# Patient Record
Sex: Male | Born: 1940
Health system: Southern US, Community
[De-identification: ages and names within clinical notes are randomized; demographics above are authoritative.]

## PROBLEM LIST (undated history)

## (undated) DIAGNOSIS — I2510 Atherosclerotic heart disease of native coronary artery without angina pectoris: Secondary | ICD-10-CM

## (undated) DIAGNOSIS — I471 Supraventricular tachycardia: Secondary | ICD-10-CM

## (undated) DIAGNOSIS — I480 Paroxysmal atrial fibrillation: Secondary | ICD-10-CM

## (undated) DIAGNOSIS — R0989 Other specified symptoms and signs involving the circulatory and respiratory systems: Secondary | ICD-10-CM

## (undated) DIAGNOSIS — Z950 Presence of cardiac pacemaker: Secondary | ICD-10-CM

## (undated) DIAGNOSIS — E785 Hyperlipidemia, unspecified: Secondary | ICD-10-CM

## (undated) DIAGNOSIS — I483 Typical atrial flutter: Secondary | ICD-10-CM

## (undated) DIAGNOSIS — R569 Unspecified convulsions: Secondary | ICD-10-CM

## (undated) DIAGNOSIS — R42 Dizziness and giddiness: Secondary | ICD-10-CM

## (undated) DIAGNOSIS — Z45018 Encounter for adjustment and management of other part of cardiac pacemaker: Secondary | ICD-10-CM

## (undated) DIAGNOSIS — R7303 Prediabetes: Secondary | ICD-10-CM

## (undated) DIAGNOSIS — G8191 Hemiplegia, unspecified affecting right dominant side: Secondary | ICD-10-CM

## (undated) DIAGNOSIS — I119 Hypertensive heart disease without heart failure: Secondary | ICD-10-CM

## (undated) DIAGNOSIS — IMO0002 Reserved for concepts with insufficient information to code with codable children: Secondary | ICD-10-CM

## (undated) DIAGNOSIS — E7801 Familial hypercholesterolemia: Secondary | ICD-10-CM

## (undated) DIAGNOSIS — R159 Full incontinence of feces: Secondary | ICD-10-CM

## (undated) DIAGNOSIS — D62 Acute posthemorrhagic anemia: Secondary | ICD-10-CM

## (undated) DIAGNOSIS — I422 Other hypertrophic cardiomyopathy: Secondary | ICD-10-CM

## (undated) DIAGNOSIS — R0682 Tachypnea, not elsewhere classified: Secondary | ICD-10-CM

## (undated) DIAGNOSIS — D696 Thrombocytopenia, unspecified: Secondary | ICD-10-CM

## (undated) DIAGNOSIS — I6350 Cerebral infarction due to unspecified occlusion or stenosis of unspecified cerebral artery: Secondary | ICD-10-CM

## (undated) DIAGNOSIS — I25119 Atherosclerotic heart disease of native coronary artery with unspecified angina pectoris: Secondary | ICD-10-CM

## (undated) DIAGNOSIS — I63512 Cerebral infarction due to unspecified occlusion or stenosis of left middle cerebral artery: Secondary | ICD-10-CM

## (undated) DIAGNOSIS — Z8679 Personal history of other diseases of the circulatory system: Secondary | ICD-10-CM

## (undated) DIAGNOSIS — G500 Trigeminal neuralgia: Secondary | ICD-10-CM

## (undated) DIAGNOSIS — Z9861 Coronary angioplasty status: Secondary | ICD-10-CM

## (undated) DIAGNOSIS — R0600 Dyspnea, unspecified: Secondary | ICD-10-CM

## (undated) DIAGNOSIS — I10 Essential (primary) hypertension: Secondary | ICD-10-CM

## (undated) DIAGNOSIS — D72829 Elevated white blood cell count, unspecified: Secondary | ICD-10-CM

## (undated) DIAGNOSIS — Z9889 Other specified postprocedural states: Secondary | ICD-10-CM

## (undated) DIAGNOSIS — Z720 Tobacco use: Secondary | ICD-10-CM

## (undated) DIAGNOSIS — I4891 Unspecified atrial fibrillation: Secondary | ICD-10-CM

## (undated) DIAGNOSIS — Z7901 Long term (current) use of anticoagulants: Secondary | ICD-10-CM

## (undated) DIAGNOSIS — F17200 Nicotine dependence, unspecified, uncomplicated: Secondary | ICD-10-CM

## (undated) DIAGNOSIS — I639 Cerebral infarction, unspecified: Secondary | ICD-10-CM

## (undated) DIAGNOSIS — I495 Sick sinus syndrome: Secondary | ICD-10-CM

## (undated) DIAGNOSIS — Z9289 Personal history of other medical treatment: Secondary | ICD-10-CM

## (undated) DIAGNOSIS — Z79899 Other long term (current) drug therapy: Secondary | ICD-10-CM

## (undated) DIAGNOSIS — J9691 Respiratory failure, unspecified with hypoxia: Secondary | ICD-10-CM

## (undated) HISTORY — DX: Thrombocytopenia, unspecified: D69.6

## (undated) HISTORY — DX: Paroxysmal atrial fibrillation: I48.0

## (undated) HISTORY — DX: Respiratory failure, unspecified with hypoxia: J96.91

## (undated) HISTORY — DX: Cerebral infarction, unspecified: I63.9

## (undated) HISTORY — DX: Dizziness and giddiness: R42

## (undated) HISTORY — DX: Tachypnea, not elsewhere classified: R06.82

## (undated) HISTORY — PX: LAMINECTOMY: SHX219

## (undated) HISTORY — DX: Essential (primary) hypertension: I10

## (undated) HISTORY — DX: Other long term (current) drug therapy: Z79.899

## (undated) HISTORY — DX: Typical atrial flutter: I48.3

## (undated) HISTORY — DX: Hemiplegia, unspecified affecting right dominant side: G81.91

## (undated) HISTORY — DX: Supraventricular tachycardia: I47.1

## (undated) HISTORY — DX: Personal history of other diseases of the circulatory system: Z86.79

## (undated) HISTORY — PX: OTHER SURGICAL HISTORY: SHX169

## (undated) HISTORY — DX: Dyspnea, unspecified: R06.00

## (undated) HISTORY — PX: KNEE ARTHROSCOPY: SHX127

## (undated) HISTORY — DX: Familial hypercholesterolemia: E78.01

## (undated) HISTORY — DX: Cerebral infarction due to unspecified occlusion or stenosis of left middle cerebral artery: I63.512

## (undated) HISTORY — DX: Unspecified atrial fibrillation: I48.91

## (undated) HISTORY — DX: Other hypertrophic cardiomyopathy: I42.2

## (undated) HISTORY — DX: Hyperlipidemia, unspecified: E78.5

## (undated) HISTORY — DX: Trigeminal neuralgia: G50.0

## (undated) HISTORY — DX: Atherosclerotic heart disease of native coronary artery with unspecified angina pectoris: I25.119

## (undated) HISTORY — DX: Elevated white blood cell count, unspecified: D72.829

## (undated) HISTORY — DX: Hypertensive heart disease without heart failure: I11.9

## (undated) HISTORY — DX: Presence of cardiac pacemaker: Z95.0

## (undated) HISTORY — DX: Nicotine dependence, unspecified, uncomplicated: F17.200

## (undated) HISTORY — DX: Unspecified convulsions: R56.9

## (undated) HISTORY — DX: Prediabetes: R73.03

## (undated) HISTORY — DX: Personal history of other medical treatment: Z92.89

## (undated) HISTORY — DX: Full incontinence of feces: R15.9

## (undated) HISTORY — DX: Acute posthemorrhagic anemia: D62

## (undated) HISTORY — DX: Reserved for concepts with insufficient information to code with codable children: IMO0002

## (undated) HISTORY — DX: Cerebral infarction due to unspecified occlusion or stenosis of unspecified cerebral artery: I63.50

## (undated) HISTORY — DX: Other specified postprocedural states: Z98.890

## (undated) HISTORY — DX: Tobacco use: Z72.0

## (undated) HISTORY — DX: Encounter for adjustment and management of other part of cardiac pacemaker: Z45.018

## (undated) HISTORY — DX: Other specified symptoms and signs involving the circulatory and respiratory systems: R09.89

---

## 2006-02-23 HISTORY — PX: INSERT / REPLACE / REMOVE PACEMAKER: SUR710

## 2010-03-25 ENCOUNTER — Ambulatory Visit
Admission: RE | Admit: 2010-03-25 | Discharge: 2010-03-25 | Payer: Self-pay | Source: Home / Self Care | Attending: Cardiovascular Disease | Admitting: Cardiovascular Disease

## 2010-03-25 NOTE — Letter (Signed)
March 25, 2010   Luis Robinson, M.D. [facility] [address] [place], [place] [address]  RE:  Luis Robinson MRN:  [id number]  /  DOB:  [birth year]  Dear Dr. Belva Crome:  Thank you for referring Luis Robinson for further cardiac evaluation.  As you are aware, this is a 70 year old gentleman with the following problem list: 1. History of hypertrophic cardiomyopathy which was diagnosed in 1999.     He is currently being treated medically. 2. Sick sinus syndrome, status post permanent pacemaker placement in     2008 at [facility]. 3. Hyperlipidemia. 4. Hypertension. 5. Tobacco use.  HISTORY OF PRESENT ILLNESS:  Luis Robinson is here today for a followup visit regarding his cardiac condition.  He is being seen here for the first time.  He carries a diagnosis of hypertrophic cardiomyopathy which was diagnosed in the early 23s.  Most of his symptoms at that time were dyspnea and dizziness.  At some point in 2008, they talked with him about the possibility of performing alcohol septal ablation.  It seems that his symptoms were mild and thus he was treated medically.  His symptoms of dizziness worsened in 2008.  His evaluation revealed sinus pauses according to him which required ultimately a permanent pacemaker placement which was done at [facility].  Before that, the patient was living in the [facility].  He did follow up with Dr. Dulce Sellar here but has not seen him in more than a year due to insurance reasons. Patient overall has been doing reasonably well.  He has not had any episodes of chest pain or dyspnea.  He does complain of dizziness, especially when he stands up and with certain positions.  There has been no syncope.  There is no orthopnea, PND, or lower extremity edema. Overall, he continues to be active and does not seem to have significant physical limitations.  He gets his pacemaker checked over the phone.  MEDICATIONS: 1. Metoprolol extended  release 50 mg once daily. 2. Crestor 20 mg once daily. 3. Celebrex 200 mg once daily. 4. Aspirin 81 mg once daily. 5. Multivitamin once daily. 6. Fish oil 1000 mg twice daily.  ALLERGIES:  NO KNOWN DRUG ALLERGIES.  SOCIAL HISTORY:  He smokes 5 to 6 cigarettes a day and has been doing that for 45 years.  He tried to quit multiple times with multiple interventions used but all have failed.  He denies any alcohol or recreational drug use.  He does exercise at the [facility] on a regular basis. He is widowed and retired.  He used to work in home improvement.  PAST SURGICAL HISTORY:  Includes: 1. Back surgery twice. 2. Rotator cuff surgery. 3. Permanent pacemaker placement.  FAMILY HISTORY:  His father had a heart murmur but does not know much more details.  There is no family history of premature coronary artery disease.  REVIEW OF SYSTEMS:  Remarkable for dizziness as described above.  There has been no chest pain or dyspnea.  A full review of system was performed and is otherwise negative.  PHYSICAL EXAMINATION:  GENERAL:  The patient appears to be at his stated age and in no acute distress. VITAL SIGNS:  Weight is 207.6 pounds, blood pressure is 113/64, pulse is 76, oxygen saturation is 96% on room air. HEENT:  Normocephalic, atraumatic. NECK:  No JVD or carotid bruits. RESPIRATORY:  Normal respiratory effort with no use of accessory muscles.  Auscultation reveals normal breath sounds. CARDIOVASCULAR:  Normal PMI.  Normal S1 and S2 with no gallops.  There is a 3/6 systolic ejection murmur at the aortic area but it is loudest at the left sternal border.  The murmur is slightly louder with Valsalva maneuver.  He also has a murmur at the apex suspicious for mitral regurgitation. ABDOMEN:  Benign, nontender, nondistended. EXTREMITIES:  With no clubbing, cyanosis, or edema. SKIN:  Warm and dry with no rash. PSYCHIATRIC:  He is alert, oriented x3, with normal mood and  affect. MUSCULOSKELETAL:  There is normal muscle strength in the upper and lower extremities.  An electrocardiogram was performed which showed AV sequential pacemaker which seems to be sensing and capturing normally.  IMPRESSION: 1. Hypertrophic cardiomyopathy:  Overall, his symptoms of obstruction     seem to be very mild and controlled with current treatment with     metoprolol.  His most recent cardiac evaluation included a stress     test which was done in September of 2010 with [facility].     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  His current symptoms     mainly include mild dizziness.  He does have a loud murmur by     physical exam suggestive of hypertrophic cardiomyopathy but there     is also another murmur that might indicate mitral regurgitation     which is usually associated with this condition.  Thus, I will     request a transthoracic echocardiogram for further evaluation given     that he has not had once in awhile.  Most likely we will continue     medical therapy given that his symptoms are controlled. 2. Status post permanent pacemaker placement for sick sinus syndrome:     His pacemaker seems to be functioning well. 3. Tobacco use:  The patient was counseled about the importance of     smoking cessation. 4. Hyperlipidemia, being treated with Crestor.  The patient will follow up in 6 months from now or earlier if needed.   Sincerely,     Luis Robinson, Luis Robinson Electronically Signed   MA/MedQ  DD: 03/25/2010  DT: 03/25/2010  Job #: [id number]

## 2010-03-26 ENCOUNTER — Telehealth (INDEPENDENT_AMBULATORY_CARE_PROVIDER_SITE_OTHER): Payer: Self-pay | Admitting: *Deleted

## 2010-04-02 NOTE — Progress Notes (Signed)
----   Converted from flag ---- ---- 03/25/2010 4:15 PM, Marilynne Halsted, CMA, AAMA wrote: Pt has [employer] only.  no precert required.  ---- 03/25/2010 2:30 PM, Dessie Coma  LPN wrote: ECHO SCHEDULED AT [facility] FOR 03/27/10 FOR 425.11.  THANKS,Mayo Owczarzak ------------------------------

## 2010-04-07 ENCOUNTER — Telehealth: Payer: Self-pay | Admitting: Cardiovascular Disease

## 2010-04-16 NOTE — Progress Notes (Signed)
Summary: Echo results  Phone Note Outgoing Call   Call placed by: Dessie Coma  LPN,  April 07, 2010 3:57 PM Call placed to: Patient Summary of Call: Patient notified per Dr. Kirke Corin, Echo was OK.  There is moderate leaking in one of the valves.  Will monitor this.

## 2010-04-21 ENCOUNTER — Encounter: Payer: Self-pay | Admitting: Cardiovascular Disease

## 2010-08-06 ENCOUNTER — Encounter: Payer: Self-pay | Admitting: Cardiovascular Disease

## 2012-10-13 DIAGNOSIS — G500 Trigeminal neuralgia: Secondary | ICD-10-CM

## 2012-10-13 HISTORY — DX: Trigeminal neuralgia: G50.0

## 2014-09-24 DIAGNOSIS — Z9861 Coronary angioplasty status: Secondary | ICD-10-CM

## 2014-09-24 DIAGNOSIS — I2510 Atherosclerotic heart disease of native coronary artery without angina pectoris: Secondary | ICD-10-CM

## 2014-09-24 HISTORY — DX: Atherosclerotic heart disease of native coronary artery without angina pectoris: I25.10

## 2014-09-24 HISTORY — DX: Coronary angioplasty status: Z98.61

## 2014-10-09 DIAGNOSIS — E785 Hyperlipidemia, unspecified: Secondary | ICD-10-CM

## 2014-10-09 DIAGNOSIS — Z950 Presence of cardiac pacemaker: Secondary | ICD-10-CM | POA: Insufficient documentation

## 2014-10-09 DIAGNOSIS — Z45018 Encounter for adjustment and management of other part of cardiac pacemaker: Secondary | ICD-10-CM

## 2014-10-09 DIAGNOSIS — I422 Other hypertrophic cardiomyopathy: Secondary | ICD-10-CM

## 2014-10-09 HISTORY — DX: Hyperlipidemia, unspecified: E78.5

## 2014-10-09 HISTORY — DX: Other hypertrophic cardiomyopathy: I42.2

## 2014-10-09 HISTORY — DX: Encounter for adjustment and management of other part of cardiac pacemaker: Z45.018

## 2014-10-30 DIAGNOSIS — I25119 Atherosclerotic heart disease of native coronary artery with unspecified angina pectoris: Secondary | ICD-10-CM

## 2014-10-30 HISTORY — DX: Atherosclerotic heart disease of native coronary artery with unspecified angina pectoris: I25.119

## 2014-11-29 DIAGNOSIS — Z23 Encounter for immunization: Secondary | ICD-10-CM | POA: Diagnosis not present

## 2014-12-06 DIAGNOSIS — Z0289 Encounter for other administrative examinations: Secondary | ICD-10-CM | POA: Diagnosis not present

## 2014-12-12 DIAGNOSIS — Z0100 Encounter for examination of eyes and vision without abnormal findings: Secondary | ICD-10-CM | POA: Diagnosis not present

## 2014-12-12 DIAGNOSIS — H524 Presbyopia: Secondary | ICD-10-CM | POA: Diagnosis not present

## 2014-12-25 HISTORY — PX: ABLATION: SHX5711

## 2015-01-07 DIAGNOSIS — Z45018 Encounter for adjustment and management of other part of cardiac pacemaker: Secondary | ICD-10-CM | POA: Diagnosis not present

## 2015-01-07 DIAGNOSIS — I498 Other specified cardiac arrhythmias: Secondary | ICD-10-CM | POA: Diagnosis not present

## 2015-01-15 DIAGNOSIS — I471 Supraventricular tachycardia, unspecified: Secondary | ICD-10-CM

## 2015-01-15 DIAGNOSIS — E782 Mixed hyperlipidemia: Secondary | ICD-10-CM | POA: Diagnosis not present

## 2015-01-15 DIAGNOSIS — T829XXA Unspecified complication of cardiac and vascular prosthetic device, implant and graft, initial encounter: Secondary | ICD-10-CM | POA: Diagnosis not present

## 2015-01-15 DIAGNOSIS — I517 Cardiomegaly: Secondary | ICD-10-CM | POA: Diagnosis not present

## 2015-01-15 DIAGNOSIS — E7800 Pure hypercholesterolemia, unspecified: Secondary | ICD-10-CM | POA: Diagnosis not present

## 2015-01-15 DIAGNOSIS — I5030 Unspecified diastolic (congestive) heart failure: Secondary | ICD-10-CM | POA: Diagnosis not present

## 2015-01-15 DIAGNOSIS — Z950 Presence of cardiac pacemaker: Secondary | ICD-10-CM | POA: Diagnosis not present

## 2015-01-15 DIAGNOSIS — R000 Tachycardia, unspecified: Secondary | ICD-10-CM | POA: Diagnosis not present

## 2015-01-15 DIAGNOSIS — I081 Rheumatic disorders of both mitral and tricuspid valves: Secondary | ICD-10-CM | POA: Diagnosis not present

## 2015-01-15 DIAGNOSIS — X58XXXA Exposure to other specified factors, initial encounter: Secondary | ICD-10-CM | POA: Diagnosis not present

## 2015-01-15 DIAGNOSIS — R002 Palpitations: Secondary | ICD-10-CM | POA: Diagnosis not present

## 2015-01-15 DIAGNOSIS — R0602 Shortness of breath: Secondary | ICD-10-CM | POA: Diagnosis not present

## 2015-01-15 DIAGNOSIS — I480 Paroxysmal atrial fibrillation: Secondary | ICD-10-CM | POA: Diagnosis not present

## 2015-01-15 DIAGNOSIS — I10 Essential (primary) hypertension: Secondary | ICD-10-CM | POA: Diagnosis not present

## 2015-01-15 DIAGNOSIS — I5020 Unspecified systolic (congestive) heart failure: Secondary | ICD-10-CM | POA: Diagnosis not present

## 2015-01-15 DIAGNOSIS — I252 Old myocardial infarction: Secondary | ICD-10-CM | POA: Diagnosis not present

## 2015-01-15 DIAGNOSIS — Z955 Presence of coronary angioplasty implant and graft: Secondary | ICD-10-CM | POA: Diagnosis not present

## 2015-01-15 DIAGNOSIS — I495 Sick sinus syndrome: Secondary | ICD-10-CM | POA: Diagnosis not present

## 2015-01-15 DIAGNOSIS — Z45018 Encounter for adjustment and management of other part of cardiac pacemaker: Secondary | ICD-10-CM | POA: Diagnosis not present

## 2015-01-15 DIAGNOSIS — I4892 Unspecified atrial flutter: Secondary | ICD-10-CM | POA: Diagnosis not present

## 2015-01-15 DIAGNOSIS — J811 Chronic pulmonary edema: Secondary | ICD-10-CM | POA: Diagnosis not present

## 2015-01-15 DIAGNOSIS — I421 Obstructive hypertrophic cardiomyopathy: Secondary | ICD-10-CM | POA: Diagnosis not present

## 2015-01-15 DIAGNOSIS — I4891 Unspecified atrial fibrillation: Secondary | ICD-10-CM | POA: Diagnosis not present

## 2015-01-15 DIAGNOSIS — M7981 Nontraumatic hematoma of soft tissue: Secondary | ICD-10-CM | POA: Diagnosis not present

## 2015-01-15 DIAGNOSIS — Z952 Presence of prosthetic heart valve: Secondary | ICD-10-CM | POA: Diagnosis not present

## 2015-01-15 DIAGNOSIS — I2510 Atherosclerotic heart disease of native coronary artery without angina pectoris: Secondary | ICD-10-CM | POA: Diagnosis not present

## 2015-01-15 DIAGNOSIS — I422 Other hypertrophic cardiomyopathy: Secondary | ICD-10-CM | POA: Diagnosis not present

## 2015-01-15 DIAGNOSIS — I484 Atypical atrial flutter: Secondary | ICD-10-CM | POA: Diagnosis not present

## 2015-01-15 DIAGNOSIS — I483 Typical atrial flutter: Secondary | ICD-10-CM

## 2015-01-15 DIAGNOSIS — I340 Nonrheumatic mitral (valve) insufficiency: Secondary | ICD-10-CM | POA: Diagnosis not present

## 2015-01-15 DIAGNOSIS — Z9861 Coronary angioplasty status: Secondary | ICD-10-CM | POA: Diagnosis not present

## 2015-01-15 DIAGNOSIS — R079 Chest pain, unspecified: Secondary | ICD-10-CM | POA: Diagnosis not present

## 2015-01-15 DIAGNOSIS — I25119 Atherosclerotic heart disease of native coronary artery with unspecified angina pectoris: Secondary | ICD-10-CM | POA: Diagnosis not present

## 2015-01-15 HISTORY — DX: Typical atrial flutter: I48.3

## 2015-01-15 HISTORY — DX: Supraventricular tachycardia: I47.1

## 2015-01-15 HISTORY — DX: Supraventricular tachycardia, unspecified: I47.10

## 2015-01-18 DIAGNOSIS — I517 Cardiomegaly: Secondary | ICD-10-CM | POA: Diagnosis not present

## 2015-01-18 DIAGNOSIS — I081 Rheumatic disorders of both mitral and tricuspid valves: Secondary | ICD-10-CM | POA: Diagnosis not present

## 2015-01-18 DIAGNOSIS — T829XXA Unspecified complication of cardiac and vascular prosthetic device, implant and graft, initial encounter: Secondary | ICD-10-CM | POA: Diagnosis not present

## 2015-01-18 DIAGNOSIS — I495 Sick sinus syndrome: Secondary | ICD-10-CM | POA: Diagnosis not present

## 2015-01-18 DIAGNOSIS — I4891 Unspecified atrial fibrillation: Secondary | ICD-10-CM | POA: Diagnosis not present

## 2015-01-18 DIAGNOSIS — Z45018 Encounter for adjustment and management of other part of cardiac pacemaker: Secondary | ICD-10-CM | POA: Diagnosis not present

## 2015-01-18 DIAGNOSIS — X58XXXA Exposure to other specified factors, initial encounter: Secondary | ICD-10-CM | POA: Diagnosis not present

## 2015-01-19 DIAGNOSIS — Z950 Presence of cardiac pacemaker: Secondary | ICD-10-CM | POA: Diagnosis not present

## 2015-01-19 DIAGNOSIS — Z955 Presence of coronary angioplasty implant and graft: Secondary | ICD-10-CM | POA: Diagnosis not present

## 2015-01-19 DIAGNOSIS — I421 Obstructive hypertrophic cardiomyopathy: Secondary | ICD-10-CM | POA: Diagnosis not present

## 2015-01-19 DIAGNOSIS — I2510 Atherosclerotic heart disease of native coronary artery without angina pectoris: Secondary | ICD-10-CM | POA: Diagnosis not present

## 2015-01-19 DIAGNOSIS — R002 Palpitations: Secondary | ICD-10-CM | POA: Diagnosis not present

## 2015-01-20 DIAGNOSIS — I4892 Unspecified atrial flutter: Secondary | ICD-10-CM | POA: Diagnosis not present

## 2015-01-20 DIAGNOSIS — Z955 Presence of coronary angioplasty implant and graft: Secondary | ICD-10-CM | POA: Diagnosis not present

## 2015-01-20 DIAGNOSIS — Z950 Presence of cardiac pacemaker: Secondary | ICD-10-CM | POA: Diagnosis not present

## 2015-01-21 DIAGNOSIS — I4892 Unspecified atrial flutter: Secondary | ICD-10-CM | POA: Diagnosis not present

## 2015-01-21 DIAGNOSIS — Z87891 Personal history of nicotine dependence: Secondary | ICD-10-CM | POA: Diagnosis not present

## 2015-01-21 DIAGNOSIS — I4891 Unspecified atrial fibrillation: Secondary | ICD-10-CM | POA: Diagnosis not present

## 2015-01-21 DIAGNOSIS — I484 Atypical atrial flutter: Secondary | ICD-10-CM | POA: Diagnosis not present

## 2015-01-21 DIAGNOSIS — E782 Mixed hyperlipidemia: Secondary | ICD-10-CM | POA: Diagnosis not present

## 2015-01-21 DIAGNOSIS — J449 Chronic obstructive pulmonary disease, unspecified: Secondary | ICD-10-CM | POA: Diagnosis not present

## 2015-01-21 DIAGNOSIS — I483 Typical atrial flutter: Secondary | ICD-10-CM | POA: Diagnosis not present

## 2015-01-21 DIAGNOSIS — I10 Essential (primary) hypertension: Secondary | ICD-10-CM | POA: Diagnosis not present

## 2015-01-21 DIAGNOSIS — I25119 Atherosclerotic heart disease of native coronary artery with unspecified angina pectoris: Secondary | ICD-10-CM | POA: Diagnosis not present

## 2015-01-21 DIAGNOSIS — E785 Hyperlipidemia, unspecified: Secondary | ICD-10-CM | POA: Diagnosis not present

## 2015-01-21 DIAGNOSIS — I2510 Atherosclerotic heart disease of native coronary artery without angina pectoris: Secondary | ICD-10-CM | POA: Diagnosis not present

## 2015-01-21 DIAGNOSIS — Z950 Presence of cardiac pacemaker: Secondary | ICD-10-CM | POA: Diagnosis not present

## 2015-01-21 DIAGNOSIS — I340 Nonrheumatic mitral (valve) insufficiency: Secondary | ICD-10-CM | POA: Diagnosis not present

## 2015-01-21 DIAGNOSIS — I471 Supraventricular tachycardia: Secondary | ICD-10-CM | POA: Diagnosis not present

## 2015-01-21 DIAGNOSIS — Z955 Presence of coronary angioplasty implant and graft: Secondary | ICD-10-CM | POA: Diagnosis not present

## 2015-01-21 DIAGNOSIS — M1990 Unspecified osteoarthritis, unspecified site: Secondary | ICD-10-CM | POA: Diagnosis not present

## 2015-01-21 DIAGNOSIS — M7981 Nontraumatic hematoma of soft tissue: Secondary | ICD-10-CM | POA: Diagnosis not present

## 2015-01-21 DIAGNOSIS — I081 Rheumatic disorders of both mitral and tricuspid valves: Secondary | ICD-10-CM | POA: Diagnosis not present

## 2015-01-21 DIAGNOSIS — I422 Other hypertrophic cardiomyopathy: Secondary | ICD-10-CM | POA: Diagnosis not present

## 2015-01-23 DIAGNOSIS — Z9889 Other specified postprocedural states: Secondary | ICD-10-CM

## 2015-01-23 DIAGNOSIS — Z8679 Personal history of other diseases of the circulatory system: Secondary | ICD-10-CM | POA: Insufficient documentation

## 2015-01-23 DIAGNOSIS — I4892 Unspecified atrial flutter: Secondary | ICD-10-CM | POA: Diagnosis not present

## 2015-01-23 HISTORY — DX: Other specified postprocedural states: Z86.79

## 2015-01-23 HISTORY — DX: Personal history of other diseases of the circulatory system: Z98.890

## 2015-01-24 DIAGNOSIS — I422 Other hypertrophic cardiomyopathy: Secondary | ICD-10-CM | POA: Diagnosis not present

## 2015-01-24 DIAGNOSIS — I483 Typical atrial flutter: Secondary | ICD-10-CM | POA: Diagnosis not present

## 2015-01-24 DIAGNOSIS — Z950 Presence of cardiac pacemaker: Secondary | ICD-10-CM | POA: Diagnosis not present

## 2015-01-24 DIAGNOSIS — I471 Supraventricular tachycardia: Secondary | ICD-10-CM | POA: Diagnosis not present

## 2015-01-24 DIAGNOSIS — E782 Mixed hyperlipidemia: Secondary | ICD-10-CM | POA: Diagnosis not present

## 2015-01-24 DIAGNOSIS — I25119 Atherosclerotic heart disease of native coronary artery with unspecified angina pectoris: Secondary | ICD-10-CM | POA: Diagnosis not present

## 2015-02-01 DIAGNOSIS — I422 Other hypertrophic cardiomyopathy: Secondary | ICD-10-CM | POA: Diagnosis not present

## 2015-02-20 DIAGNOSIS — I4892 Unspecified atrial flutter: Secondary | ICD-10-CM | POA: Diagnosis not present

## 2015-03-07 DIAGNOSIS — M25469 Effusion, unspecified knee: Secondary | ICD-10-CM | POA: Diagnosis not present

## 2015-03-07 DIAGNOSIS — M25462 Effusion, left knee: Secondary | ICD-10-CM | POA: Diagnosis not present

## 2015-03-13 DIAGNOSIS — M1712 Unilateral primary osteoarthritis, left knee: Secondary | ICD-10-CM | POA: Diagnosis not present

## 2015-03-13 DIAGNOSIS — M179 Osteoarthritis of knee, unspecified: Secondary | ICD-10-CM | POA: Diagnosis not present

## 2015-03-13 DIAGNOSIS — M25462 Effusion, left knee: Secondary | ICD-10-CM | POA: Diagnosis not present

## 2015-03-25 DIAGNOSIS — M25462 Effusion, left knee: Secondary | ICD-10-CM | POA: Diagnosis not present

## 2015-04-09 DIAGNOSIS — Z45010 Encounter for checking and testing of cardiac pacemaker pulse generator [battery]: Secondary | ICD-10-CM | POA: Diagnosis not present

## 2015-04-09 DIAGNOSIS — I498 Other specified cardiac arrhythmias: Secondary | ICD-10-CM | POA: Diagnosis not present

## 2015-04-25 DIAGNOSIS — I10 Essential (primary) hypertension: Secondary | ICD-10-CM | POA: Diagnosis not present

## 2015-04-25 DIAGNOSIS — Z79899 Other long term (current) drug therapy: Secondary | ICD-10-CM | POA: Diagnosis not present

## 2015-04-25 DIAGNOSIS — D696 Thrombocytopenia, unspecified: Secondary | ICD-10-CM | POA: Diagnosis not present

## 2015-04-25 DIAGNOSIS — E7800 Pure hypercholesterolemia, unspecified: Secondary | ICD-10-CM | POA: Diagnosis not present

## 2015-04-25 DIAGNOSIS — I2510 Atherosclerotic heart disease of native coronary artery without angina pectoris: Secondary | ICD-10-CM | POA: Diagnosis not present

## 2015-04-25 DIAGNOSIS — G500 Trigeminal neuralgia: Secondary | ICD-10-CM | POA: Diagnosis not present

## 2015-04-25 DIAGNOSIS — M1712 Unilateral primary osteoarthritis, left knee: Secondary | ICD-10-CM | POA: Diagnosis not present

## 2015-05-07 DIAGNOSIS — I10 Essential (primary) hypertension: Secondary | ICD-10-CM | POA: Diagnosis not present

## 2015-05-07 DIAGNOSIS — Z79899 Other long term (current) drug therapy: Secondary | ICD-10-CM | POA: Diagnosis not present

## 2015-05-07 DIAGNOSIS — E7800 Pure hypercholesterolemia, unspecified: Secondary | ICD-10-CM | POA: Diagnosis not present

## 2015-05-07 DIAGNOSIS — M25462 Effusion, left knee: Secondary | ICD-10-CM | POA: Diagnosis not present

## 2015-05-07 DIAGNOSIS — I2510 Atherosclerotic heart disease of native coronary artery without angina pectoris: Secondary | ICD-10-CM | POA: Diagnosis not present

## 2015-05-07 DIAGNOSIS — D696 Thrombocytopenia, unspecified: Secondary | ICD-10-CM | POA: Diagnosis not present

## 2015-05-20 ENCOUNTER — Emergency Department (HOSPITAL_COMMUNITY): Payer: Medicare HMO

## 2015-05-20 ENCOUNTER — Emergency Department (HOSPITAL_COMMUNITY): Payer: Medicare HMO | Admitting: Certified Registered"

## 2015-05-20 ENCOUNTER — Encounter (HOSPITAL_COMMUNITY)
Admission: EM | Disposition: A | Discharge status: Rehabilitation Facility | Payer: Self-pay | Source: Home / Self Care | Attending: Neurology

## 2015-05-20 ENCOUNTER — Inpatient Hospital Stay (HOSPITAL_COMMUNITY)
Admission: EM | Admit: 2015-05-20 | Discharge: 2015-05-24 | DRG: 023 | Disposition: A | Discharge status: Rehabilitation Facility | Payer: Medicare HMO | Attending: Neurology | Admitting: Neurology

## 2015-05-20 ENCOUNTER — Encounter (HOSPITAL_COMMUNITY): Payer: Self-pay | Admitting: Radiology

## 2015-05-20 DIAGNOSIS — Z7982 Long term (current) use of aspirin: Secondary | ICD-10-CM

## 2015-05-20 DIAGNOSIS — I63512 Cerebral infarction due to unspecified occlusion or stenosis of left middle cerebral artery: Secondary | ICD-10-CM | POA: Diagnosis not present

## 2015-05-20 DIAGNOSIS — I480 Paroxysmal atrial fibrillation: Secondary | ICD-10-CM | POA: Diagnosis present

## 2015-05-20 DIAGNOSIS — I6522 Occlusion and stenosis of left carotid artery: Secondary | ICD-10-CM | POA: Diagnosis present

## 2015-05-20 DIAGNOSIS — R531 Weakness: Secondary | ICD-10-CM | POA: Diagnosis not present

## 2015-05-20 DIAGNOSIS — J95821 Acute postprocedural respiratory failure: Secondary | ICD-10-CM | POA: Diagnosis not present

## 2015-05-20 DIAGNOSIS — I2510 Atherosclerotic heart disease of native coronary artery without angina pectoris: Secondary | ICD-10-CM

## 2015-05-20 DIAGNOSIS — Z9861 Coronary angioplasty status: Secondary | ICD-10-CM

## 2015-05-20 DIAGNOSIS — I421 Obstructive hypertrophic cardiomyopathy: Secondary | ICD-10-CM | POA: Diagnosis not present

## 2015-05-20 DIAGNOSIS — I509 Heart failure, unspecified: Secondary | ICD-10-CM | POA: Diagnosis present

## 2015-05-20 DIAGNOSIS — F17210 Nicotine dependence, cigarettes, uncomplicated: Secondary | ICD-10-CM | POA: Diagnosis present

## 2015-05-20 DIAGNOSIS — R4701 Aphasia: Secondary | ICD-10-CM | POA: Diagnosis not present

## 2015-05-20 DIAGNOSIS — I6350 Cerebral infarction due to unspecified occlusion or stenosis of unspecified cerebral artery: Secondary | ICD-10-CM | POA: Diagnosis present

## 2015-05-20 DIAGNOSIS — D696 Thrombocytopenia, unspecified: Secondary | ICD-10-CM | POA: Diagnosis not present

## 2015-05-20 DIAGNOSIS — N3000 Acute cystitis without hematuria: Secondary | ICD-10-CM | POA: Diagnosis not present

## 2015-05-20 DIAGNOSIS — J9691 Respiratory failure, unspecified with hypoxia: Secondary | ICD-10-CM | POA: Diagnosis present

## 2015-05-20 DIAGNOSIS — Y838 Other surgical procedures as the cause of abnormal reaction of the patient, or of later complication, without mention of misadventure at the time of the procedure: Secondary | ICD-10-CM | POA: Diagnosis not present

## 2015-05-20 DIAGNOSIS — I482 Chronic atrial fibrillation, unspecified: Secondary | ICD-10-CM | POA: Diagnosis present

## 2015-05-20 DIAGNOSIS — I6789 Other cerebrovascular disease: Secondary | ICD-10-CM | POA: Diagnosis not present

## 2015-05-20 DIAGNOSIS — I10 Essential (primary) hypertension: Secondary | ICD-10-CM | POA: Diagnosis present

## 2015-05-20 DIAGNOSIS — Z950 Presence of cardiac pacemaker: Secondary | ICD-10-CM | POA: Diagnosis present

## 2015-05-20 DIAGNOSIS — I110 Hypertensive heart disease with heart failure: Secondary | ICD-10-CM | POA: Diagnosis present

## 2015-05-20 DIAGNOSIS — R0602 Shortness of breath: Secondary | ICD-10-CM | POA: Diagnosis not present

## 2015-05-20 DIAGNOSIS — R030 Elevated blood-pressure reading, without diagnosis of hypertension: Secondary | ICD-10-CM | POA: Diagnosis not present

## 2015-05-20 DIAGNOSIS — G4733 Obstructive sleep apnea (adult) (pediatric): Secondary | ICD-10-CM | POA: Diagnosis present

## 2015-05-20 DIAGNOSIS — I422 Other hypertrophic cardiomyopathy: Secondary | ICD-10-CM | POA: Diagnosis present

## 2015-05-20 DIAGNOSIS — Z720 Tobacco use: Secondary | ICD-10-CM | POA: Diagnosis present

## 2015-05-20 DIAGNOSIS — R1310 Dysphagia, unspecified: Secondary | ICD-10-CM | POA: Diagnosis not present

## 2015-05-20 DIAGNOSIS — J811 Chronic pulmonary edema: Secondary | ICD-10-CM | POA: Diagnosis not present

## 2015-05-20 DIAGNOSIS — J9690 Respiratory failure, unspecified, unspecified whether with hypoxia or hypercapnia: Secondary | ICD-10-CM | POA: Diagnosis not present

## 2015-05-20 DIAGNOSIS — Z9289 Personal history of other medical treatment: Secondary | ICD-10-CM

## 2015-05-20 DIAGNOSIS — I63412 Cerebral infarction due to embolism of left middle cerebral artery: Secondary | ICD-10-CM | POA: Diagnosis not present

## 2015-05-20 DIAGNOSIS — R29818 Other symptoms and signs involving the nervous system: Secondary | ICD-10-CM | POA: Diagnosis not present

## 2015-05-20 DIAGNOSIS — G8101 Flaccid hemiplegia affecting right dominant side: Secondary | ICD-10-CM | POA: Diagnosis not present

## 2015-05-20 DIAGNOSIS — I69320 Aphasia following cerebral infarction: Secondary | ICD-10-CM | POA: Diagnosis not present

## 2015-05-20 DIAGNOSIS — R7303 Prediabetes: Secondary | ICD-10-CM | POA: Diagnosis not present

## 2015-05-20 DIAGNOSIS — M21371 Foot drop, right foot: Secondary | ICD-10-CM | POA: Diagnosis not present

## 2015-05-20 DIAGNOSIS — I63312 Cerebral infarction due to thrombosis of left middle cerebral artery: Secondary | ICD-10-CM | POA: Diagnosis not present

## 2015-05-20 DIAGNOSIS — J189 Pneumonia, unspecified organism: Secondary | ICD-10-CM | POA: Diagnosis not present

## 2015-05-20 DIAGNOSIS — Z0189 Encounter for other specified special examinations: Secondary | ICD-10-CM

## 2015-05-20 DIAGNOSIS — I639 Cerebral infarction, unspecified: Secondary | ICD-10-CM | POA: Diagnosis not present

## 2015-05-20 DIAGNOSIS — I69391 Dysphagia following cerebral infarction: Secondary | ICD-10-CM | POA: Diagnosis not present

## 2015-05-20 DIAGNOSIS — I495 Sick sinus syndrome: Secondary | ICD-10-CM | POA: Diagnosis present

## 2015-05-20 DIAGNOSIS — J440 Chronic obstructive pulmonary disease with acute lower respiratory infection: Secondary | ICD-10-CM | POA: Diagnosis not present

## 2015-05-20 DIAGNOSIS — D72829 Elevated white blood cell count, unspecified: Secondary | ICD-10-CM

## 2015-05-20 DIAGNOSIS — Z4682 Encounter for fitting and adjustment of non-vascular catheter: Secondary | ICD-10-CM | POA: Diagnosis not present

## 2015-05-20 DIAGNOSIS — Z79899 Other long term (current) drug therapy: Secondary | ICD-10-CM

## 2015-05-20 DIAGNOSIS — Z7901 Long term (current) use of anticoagulants: Secondary | ICD-10-CM | POA: Diagnosis not present

## 2015-05-20 DIAGNOSIS — E785 Hyperlipidemia, unspecified: Secondary | ICD-10-CM | POA: Diagnosis present

## 2015-05-20 DIAGNOSIS — G8191 Hemiplegia, unspecified affecting right dominant side: Secondary | ICD-10-CM | POA: Diagnosis not present

## 2015-05-20 DIAGNOSIS — R05 Cough: Secondary | ICD-10-CM | POA: Diagnosis not present

## 2015-05-20 DIAGNOSIS — I4891 Unspecified atrial fibrillation: Secondary | ICD-10-CM | POA: Diagnosis not present

## 2015-05-20 DIAGNOSIS — I69398 Other sequelae of cerebral infarction: Secondary | ICD-10-CM | POA: Diagnosis not present

## 2015-05-20 DIAGNOSIS — N390 Urinary tract infection, site not specified: Secondary | ICD-10-CM | POA: Diagnosis present

## 2015-05-20 DIAGNOSIS — R29810 Facial weakness: Secondary | ICD-10-CM | POA: Diagnosis present

## 2015-05-20 DIAGNOSIS — D62 Acute posthemorrhagic anemia: Secondary | ICD-10-CM | POA: Diagnosis not present

## 2015-05-20 DIAGNOSIS — J9601 Acute respiratory failure with hypoxia: Secondary | ICD-10-CM | POA: Diagnosis not present

## 2015-05-20 DIAGNOSIS — I6602 Occlusion and stenosis of left middle cerebral artery: Secondary | ICD-10-CM | POA: Diagnosis not present

## 2015-05-20 DIAGNOSIS — I63132 Cerebral infarction due to embolism of left carotid artery: Secondary | ICD-10-CM | POA: Diagnosis not present

## 2015-05-20 DIAGNOSIS — I6502 Occlusion and stenosis of left vertebral artery: Secondary | ICD-10-CM | POA: Diagnosis not present

## 2015-05-20 DIAGNOSIS — G939 Disorder of brain, unspecified: Secondary | ICD-10-CM | POA: Diagnosis not present

## 2015-05-20 DIAGNOSIS — I959 Hypotension, unspecified: Secondary | ICD-10-CM | POA: Diagnosis not present

## 2015-05-20 DIAGNOSIS — Q251 Coarctation of aorta: Secondary | ICD-10-CM

## 2015-05-20 DIAGNOSIS — I69351 Hemiplegia and hemiparesis following cerebral infarction affecting right dominant side: Secondary | ICD-10-CM | POA: Diagnosis not present

## 2015-05-20 HISTORY — DX: Atherosclerotic heart disease of native coronary artery without angina pectoris: I25.10

## 2015-05-20 HISTORY — DX: Long term (current) use of anticoagulants: Z79.01

## 2015-05-20 HISTORY — DX: Paroxysmal atrial fibrillation: I48.0

## 2015-05-20 HISTORY — PX: RADIOLOGY WITH ANESTHESIA: SHX6223

## 2015-05-20 HISTORY — DX: Cerebral infarction, unspecified: I63.9

## 2015-05-20 HISTORY — DX: Sick sinus syndrome: I49.5

## 2015-05-20 HISTORY — DX: Coronary angioplasty status: Z98.61

## 2015-05-20 LAB — DIFFERENTIAL
BASOS ABS: 0 10*3/uL (ref 0.0–0.1)
BASOS PCT: 0 %
EOS ABS: 0.1 10*3/uL (ref 0.0–0.7)
Eosinophils Relative: 1 %
Lymphocytes Relative: 12 %
Lymphs Abs: 1.5 10*3/uL (ref 0.7–4.0)
MONOS PCT: 6 %
Monocytes Absolute: 0.7 10*3/uL (ref 0.1–1.0)
NEUTROS ABS: 9.9 10*3/uL — AB (ref 1.7–7.7)
NEUTROS PCT: 81 %

## 2015-05-20 LAB — URINALYSIS, ROUTINE W REFLEX MICROSCOPIC
Bilirubin Urine: NEGATIVE
GLUCOSE, UA: NEGATIVE mg/dL
Ketones, ur: NEGATIVE mg/dL
Nitrite: POSITIVE — AB
PH: 5.5 (ref 5.0–8.0)
Protein, ur: NEGATIVE mg/dL
Specific Gravity, Urine: 1.023 (ref 1.005–1.030)

## 2015-05-20 LAB — I-STAT CHEM 8, ED
BUN: 24 mg/dL — ABNORMAL HIGH (ref 6–20)
CALCIUM ION: 0.93 mmol/L — AB (ref 1.13–1.30)
CHLORIDE: 106 mmol/L (ref 101–111)
Creatinine, Ser: 1 mg/dL (ref 0.61–1.24)
Glucose, Bld: 161 mg/dL — ABNORMAL HIGH (ref 65–99)
HEMATOCRIT: 48 % (ref 39.0–52.0)
Hemoglobin: 16.3 g/dL (ref 13.0–17.0)
Potassium: 3.8 mmol/L (ref 3.5–5.1)
SODIUM: 137 mmol/L (ref 135–145)
TCO2: 21 mmol/L (ref 0–100)

## 2015-05-20 LAB — COMPREHENSIVE METABOLIC PANEL
ALBUMIN: 3.6 g/dL (ref 3.5–5.0)
ALT: 22 U/L (ref 17–63)
AST: 32 U/L (ref 15–41)
Alkaline Phosphatase: 49 U/L (ref 38–126)
Anion gap: 10 (ref 5–15)
BUN: 20 mg/dL (ref 6–20)
CHLORIDE: 106 mmol/L (ref 101–111)
CO2: 22 mmol/L (ref 22–32)
Calcium: 9.2 mg/dL (ref 8.9–10.3)
Creatinine, Ser: 1.07 mg/dL (ref 0.61–1.24)
GFR calc Af Amer: >60 mL/min (ref >60)
Glucose, Bld: 165 mg/dL — ABNORMAL HIGH (ref 65–99)
POTASSIUM: 4.1 mmol/L (ref 3.5–5.1)
SODIUM: 138 mmol/L (ref 135–145)
Total Bilirubin: 0.6 mg/dL (ref 0.3–1.2)
Total Protein: 5.9 g/dL — ABNORMAL LOW (ref 6.5–8.1)

## 2015-05-20 LAB — URINE MICROSCOPIC-ADD ON

## 2015-05-20 LAB — CBC
HCT: 45.7 % (ref 39.0–52.0)
Hemoglobin: 14.9 g/dL (ref 13.0–17.0)
MCH: 30.8 pg (ref 26.0–34.0)
MCHC: 32.6 g/dL (ref 30.0–36.0)
MCV: 94.4 fL (ref 78.0–100.0)
PLATELETS: 140 10*3/uL — AB (ref 150–400)
RBC: 4.84 MIL/uL (ref 4.22–5.81)
RDW: 13.7 % (ref 11.5–15.5)
WBC: 12.2 10*3/uL — AB (ref 4.0–10.5)

## 2015-05-20 LAB — APTT: APTT: 31 s (ref 24–37)

## 2015-05-20 LAB — RAPID URINE DRUG SCREEN, HOSP PERFORMED
Amphetamines: NOT DETECTED
Barbiturates: NOT DETECTED
Benzodiazepines: NOT DETECTED
COCAINE: NOT DETECTED
OPIATES: NOT DETECTED
TETRAHYDROCANNABINOL: NOT DETECTED

## 2015-05-20 LAB — I-STAT TROPONIN, ED: TROPONIN I, POC: 0.26 ng/mL — AB (ref 0.00–0.08)

## 2015-05-20 LAB — CBG MONITORING, ED: GLUCOSE-CAPILLARY: 140 mg/dL — AB (ref 65–99)

## 2015-05-20 LAB — PROTIME-INR
INR: 1.21 (ref 0.00–1.49)
PROTHROMBIN TIME: 15.5 s — AB (ref 11.6–15.2)

## 2015-05-20 SURGERY — RADIOLOGY WITH ANESTHESIA
Anesthesia: General

## 2015-05-20 MED ORDER — ESMOLOL HCL 100 MG/10ML IV SOLN
INTRAVENOUS | Status: DC | PRN
  Administered 2015-05-20: 50 mg via INTRAVENOUS
  Administered 2015-05-20: 20 mg via INTRAVENOUS
  Administered 2015-05-20: 30 mg via INTRAVENOUS

## 2015-05-20 MED ORDER — METOPROLOL TARTRATE 1 MG/ML IV SOLN
INTRAVENOUS | Status: DC | PRN
  Administered 2015-05-20: 5 mg via INTRAVENOUS

## 2015-05-20 MED ORDER — PROPOFOL 10 MG/ML IV BOLUS
INTRAVENOUS | Status: AC
  Filled 2015-05-20: qty 20

## 2015-05-20 MED ORDER — SUFENTANIL CITRATE 50 MCG/ML IV SOLN
INTRAVENOUS | Status: AC
  Filled 2015-05-20: qty 1

## 2015-05-20 MED ORDER — PROPOFOL 10 MG/ML IV BOLUS
INTRAVENOUS | Status: DC | PRN
  Administered 2015-05-20: 140 mg via INTRAVENOUS

## 2015-05-20 MED ORDER — CLOPIDOGREL BISULFATE 75 MG PO TABS
ORAL_TABLET | ORAL | Status: AC
  Filled 2015-05-20: qty 4

## 2015-05-20 MED ORDER — PHENYLEPHRINE HCL 10 MG/ML IJ SOLN
INTRAMUSCULAR | Status: DC | PRN
  Administered 2015-05-20: 120 ug via INTRAVENOUS
  Administered 2015-05-20: 80 ug via INTRAVENOUS

## 2015-05-20 MED ORDER — NITROGLYCERIN 1 MG/10 ML FOR IR/CATH LAB
INTRA_ARTERIAL | Status: AC
  Filled 2015-05-20: qty 10

## 2015-05-20 MED ORDER — METOPROLOL TARTRATE 1 MG/ML IV SOLN: INTRAVENOUS | Status: DC | PRN

## 2015-05-20 MED ORDER — PHENYLEPHRINE HCL 10 MG/ML IJ SOLN
10.0000 mg | INTRAVENOUS | Status: DC | PRN
  Administered 2015-05-20: 40 ug/min via INTRAVENOUS

## 2015-05-20 MED ORDER — ROCURONIUM BROMIDE 100 MG/10ML IV SOLN
INTRAVENOUS | Status: DC | PRN
  Administered 2015-05-20 – 2015-05-21 (×3): 50 mg via INTRAVENOUS

## 2015-05-20 MED ORDER — LIDOCAINE HCL 1 % IJ SOLN
INTRAMUSCULAR | Status: AC
Start: 2015-05-20 — End: 2015-05-21
  Filled 2015-05-20: qty 20

## 2015-05-20 MED ORDER — EPTIFIBATIDE 20 MG/10ML IV SOLN
INTRAVENOUS | Status: AC
  Administered 2015-05-20: 2 mg
  Administered 2015-05-20: 4 mg
  Administered 2015-05-20: 2 mg
  Filled 2015-05-20: qty 10

## 2015-05-20 MED ORDER — ALBUMIN HUMAN 5 % IV SOLN
INTRAVENOUS | Status: DC | PRN
  Administered 2015-05-20: 23:00:00 via INTRAVENOUS

## 2015-05-20 MED ORDER — ASPIRIN 325 MG PO TABS
ORAL_TABLET | ORAL | Status: AC
  Filled 2015-05-20: qty 1

## 2015-05-20 MED ORDER — LACTATED RINGERS IV SOLN
INTRAVENOUS | Status: DC | PRN
  Administered 2015-05-20 (×2): via INTRAVENOUS

## 2015-05-20 MED ORDER — LIDOCAINE HCL (CARDIAC) 20 MG/ML IV SOLN
INTRAVENOUS | Status: DC | PRN
  Administered 2015-05-20: 60 mg via INTRAVENOUS

## 2015-05-20 MED ORDER — CLOPIDOGREL BISULFATE 75 MG PO TABS
300.0000 mg | ORAL_TABLET | Freq: Once | ORAL | Status: DC
  Filled 2015-05-20: qty 4

## 2015-05-20 MED ORDER — SUCCINYLCHOLINE CHLORIDE 20 MG/ML IJ SOLN
INTRAMUSCULAR | Status: DC | PRN
  Administered 2015-05-20: 100 mg via INTRAVENOUS

## 2015-05-20 MED ORDER — SUFENTANIL CITRATE 50 MCG/ML IV SOLN
INTRAVENOUS | Status: DC | PRN
  Administered 2015-05-20 – 2015-05-21 (×2): 15 ug via INTRAVENOUS
  Administered 2015-05-21: 20 ug via INTRAVENOUS

## 2015-05-20 MED ORDER — ASPIRIN 325 MG PO TABS: 325.0000 mg | ORAL_TABLET | Freq: Once | ORAL | Status: DC

## 2015-05-20 MED ORDER — IOHEXOL 350 MG/ML SOLN
50.0000 mL | Freq: Once | INTRAVENOUS | Status: AC | PRN
  Administered 2015-05-20: 50 mL via INTRAVENOUS

## 2015-05-20 MED ORDER — ALTEPLASE (STROKE) FULL DOSE INFUSION
0.9000 mg/kg | Freq: Once | INTRAVENOUS | Status: DC
  Filled 2015-05-20: qty 100

## 2015-05-20 NOTE — Progress Notes (Addendum)
Code Stroke called on 75 y.o male. LSN per family at home [id number]. His son was with him at onset, left the room for a minute and heard something fall and on his return found him to be weak on the right side with difficulty speaking. [facility] and [facility] completed STAT. Large vessel occlusion identified per Neurologist Dr. Amada JupiterKirkpatrick, IR notified and en route. NIHSS completed yielding 19, Pt non verbal right side arm flaccid and right leg weak with some movement. See flowsheet for NIHSS specifics.  Initially TPA considered while awaiting family and labs to result. Pt currently in Afib RVR and has a history of cardiomyopathy and ablation. Upon arrival of family it was revealed that Patient takes Eliquis and had his daily dose already today. Pt did not receive IV TPA. Awaiting IR team for procedure. Foley catheter placed. Pt for admit per Neurologist, PCCM to consult.

## 2015-05-20 NOTE — ED Notes (Signed)
Patient taken to IR, family present

## 2015-05-20 NOTE — ED Notes (Signed)
Patient presents from home by EMS.  Son heard something fall and yelled for him.  No answer and he went to check on him and found him sitting in a chair but not responding to him. Patient arrived to the ED with aphasia and right sided paralysis.  Patient does not answer any questions

## 2015-05-20 NOTE — H&P (Signed)
Neurology H&P Reason for Consult: Stroke   CC: Right sided weakness  History is obtained from:patient  HPI: Luis SoloKenneth Robinson is a 75 y.o. male with a history of cardiomyopathy as well as "history of ablation." Who presents with new onset right-sided weakness and aphasia occurring in the setting of atrial fibrillation. He has a history of "a flutter in his heart" and   His son was with him at onset, left the room for a minute and heard something fall and on his return found him to be weak on the right side with difficulty speaking.  LKW: 7:30 PM tpa given?: no, on anticoagulation Premorbid modified rankin scale: 1    ROS: Unable to obtain due to altered mental status.   Past Medical History  Diagnosis Date  . Hypertrophic cardiomyopathy (HCC)   . Hyperlipidemia   . Hypertension   . Tobacco use disorder   . Dyspnea   . Dizziness     Family History  Problem Relation Age of Onset  . Cancer Sister [id number]    Social History:  reports that he has been smoking Cigarettes.  He has been smoking about 0.50 packs per day. He does not have any smokeless tobacco history on file. He reports that he does not drink alcohol or use illicit drugs.   Exam: Current vital signs: Wt 81 kg (178 lb 9.2 oz) Vital signs in last 24 hours: Weight:  [81 kg (178 lb 9.2 oz)] 81 kg (178 lb 9.2 oz) (03/27 2000)   Physical Exam  Constitutional: Appears well-developed and well-nourished.  Psych: Affect appropriate to situation Eyes: No scleral injection HENT: No OP obstrucion Head: Normocephalic.  Cardiovascular: rapid, unclear if irregular given rate.   Respiratory: Effort normal and breath sounds normal to anterior ascultation GI: Soft.  No distension. There is no tenderness.  Skin: WDI  Neuro: Mental Status: Patient is awake, alert, aphasiac, does not follow commands or make any utterances.  Cranial Nerves: II: does not blink from right Pupils are equal, round, and reactive to light.   III,IV,  VI: Left gaze preference  V: Facial sensation is decreased on the left VII: Facial movement is decreased on the left VIII: hearing is intact to voice X: Does not cooperate XI: Does not comply XII: Does not comply Motor: He has a marketed right flaccid hemiparesis with no movement in his right arm, does withdraw some in his right leg. Sensory: Sensation is decreased on the right Cerebellar: Does not comply, no clear ataxia on the left  I have reviewed labs in epic and the results pertinent to this consultation are: CMP-unremarkable  I have reviewed the images obtained: CTA-M1 occlusion, left ICA is occluded at the origin, reconstitutes distally.  Impression: 75 year old male with acute left MCA infarct. I discussed his case with interventional radiology who is going to proceed with possible mechanical thrombectomy.  Recommendations: 1. HgbA1c, fasting lipid panel 2. MRI of the brain without contrast 3. Frequent neuro checks 4. Echocardiogram 5. Prophylactic therapy-Antiplatelet med: Aspirin - dose 325mg  PO or 300mg  PR, may need other depending on intervention. 6. Risk factor modification 7. Telemetry monitoring 8. PT consult, OT consult, Speech consult 9. He is borderline fast and atrial fibrillation, may need to start esmolol drip, but will hold for now as I do not want to drop his pressures at this time. I will continue to monitor opponens, initial was slightly elevated may be due to stroke. 10. I have consulted CCM that he is going to require anesthesia  for his procedure. He will return intubated and therefore I requested consultation. 11. Hold apixaban for now. He will need antiplatelet therapy, depending on what type of intervention is done will depend on what antiplatelet therapy.   This patient is critically ill and at significant risk of neurological worsening, death and care requires constant monitoring of vital signs, hemodynamics,respiratory and cardiac monitoring,  neurological assessment, discussion with family, other specialists and medical decision making of high complexity. I spent 90 minutes of neurocritical care time  in the care of  this patient.  Ritta Slot, MD [facility] [phone]  If 7pm- 7am, please page neurology on call as listed in AMION. 05/20/2015  10:19 PM

## 2015-05-20 NOTE — Anesthesia Procedure Notes (Signed)
Procedure Name: Intubation Date/Time: 05/20/2015 10:30 PM Performed by: Melina SchoolsBANKS, Gregory Dowe J Pre-anesthesia Checklist: Patient identified, Emergency Drugs available, Suction available, Patient being monitored and Timeout performed Patient Re-evaluated:Patient Re-evaluated prior to inductionOxygen Delivery Method: Circle system utilized Preoxygenation: Pre-oxygenation with 100% oxygen Intubation Type: IV induction, Rapid sequence and Cricoid Pressure applied Laryngoscope Size: Mac and 3 Grade View: Grade I Tube type: Subglottic suction tube Tube size: 8.0 mm Number of attempts: 1 Airway Equipment and Method: Stylet Placement Confirmation: ETT inserted through vocal cords under direct vision,  positive ETCO2 and breath sounds checked- equal and bilateral Secured at: 22 cm Tube secured with: Tape Dental Injury: Teeth and Oropharynx as per pre-operative assessment

## 2015-05-20 NOTE — Consult Note (Signed)
PULMONARY / CRITICAL CARE MEDICINE   Name: Luis Robinson MRN: [id number] DOB: May 30, 1940    ADMISSION DATE:  05/20/2015 CONSULTATION DATE:  05/20/15  REFERRING MD:  Amada Jupiter  CHIEF COMPLAINT:  R sided weakness  HISTORY OF PRESENT ILLNESS:  Pt is aphasic; therefore, this HPI is obtained from chart review. Luis Robinson is a 75 y.o. male with PMH as outlined below. He was brought to [facility] ED 03/27 due to sudden onset right sided weakness and aphasia.  Symptoms began suddenly that evening and son initially thought pt was having difficult time swallowing; however, turns out he wasn't able to speak.  Son was with him but had left the room and upon leaving, thought he heard something fall.  When he returned, he noticed the weakness and aphasia.  In ED, he had CTA of teh brain which revealed acute left MCA infarct.  IR was consulted and pt is to be taken to IR suite for possible mechanical thrombectomy performed.   In IR, he had complete revascularization of occluded left MCA and left ICA as well as stent placement to left ICA.  Post procedure, he returned to the ICU on the ventilator and PCCM was called for vent management.   PAST MEDICAL HISTORY :  He  has a past medical history of Hypertrophic cardiomyopathy (HCC); Hyperlipidemia; Hypertension; Tobacco use disorder; Dyspnea; and Dizziness.  PAST SURGICAL HISTORY: He  has past surgical history that includes back surgery x 2; rotator cuff surgery; and Insert / replace / remove pacemaker (2008).  Allergies  Allergen Reactions  . Bee Venom Anaphylaxis    No current facility-administered medications on file prior to encounter.   Current Outpatient Prescriptions on File Prior to Encounter  Medication Sig  . metoprolol (TOPROL-XL) 50 MG 24 hr tablet Take 25 mg by mouth daily.   . rosuvastatin (CRESTOR) 20 MG tablet Take 20 mg by mouth daily.      FAMILY HISTORY:  His indicated that his mother is deceased. He indicated that his  father is deceased. He indicated that his sister is deceased.   SOCIAL HISTORY: He  reports that he has been smoking Cigarettes.  He has been smoking about 0.50 packs per day. He has never used smokeless tobacco. He reports that he does not drink alcohol or use illicit drugs.  REVIEW OF SYSTEMS:   Unable to obtain as pt is aphasic.  SUBJECTIVE:  Awake,  Unable to speak.  RUE and RLE weakness.  VITAL SIGNS: BP 116/87 mmHg  Pulse 143  Temp(Src) 97.8 F (36.6 C) (Oral)  Resp 22  Ht 6' (1.829 m)  Wt 81 kg (178 lb 9.2 oz)  BMI 24.21 kg/m2  SpO2 100%  HEMODYNAMICS:    VENTILATOR SETTINGS:    INTAKE / OUTPUT:     PHYSICAL EXAMINATION: General: Adult male, in NAD. Neuro: Awake.  Aphasic.  RUE and RLE weakness. HEENT: [place]/AT. PERRL, sclerae anicteric. Cardiovascular: IRIR, no M/R/G.  Lungs: Respirations even and unlabored.  CTA bilaterally, No W/R/R. Abdomen: BS x 4, soft, NT/ND.  Musculoskeletal: No gross deformities, no edema.  Skin: Intact, warm, no rashes.  LABS:  BMET  Recent Labs Lab 05/20/15 2110 05/20/15 2115  NA 137 138  K 3.8 4.1  CL 106 106  CO2  --  22  BUN 24* 20  CREATININE 1.00 1.07  GLUCOSE 161* 165*    Electrolytes  Recent Labs Lab 05/20/15 2115  CALCIUM 9.2    CBC  Recent Labs Lab 05/20/15 2110 05/20/15 2115  WBC  --  12.2*  HGB 16.3 14.9  HCT 48.0 45.7  PLT  --  140*    Coag's  Recent Labs Lab 05/20/15 2115  APTT 31  INR 1.21    Sepsis Markers No results for input(s): LATICACIDVEN, PROCALCITON, O2SATVEN in the last 168 hours.  ABG No results for input(s): PHART, PCO2ART, PO2ART in the last 168 hours.  Liver Enzymes  Recent Labs Lab 05/20/15 2115  AST 32  ALT 22  ALKPHOS 49  BILITOT 0.6  ALBUMIN 3.6    Cardiac Enzymes No results for input(s): TROPONINI, PROBNP in the last 168 hours.  Glucose  Recent Labs Lab 05/20/15 2158  GLUCAP 140*    Imaging Ct Angio Head W/cm &/or Wo Cm  05/20/2015   CLINICAL DATA:  Right-sided weakness and aphasia. EXAM: CT ANGIOGRAPHY HEAD AND NECK TECHNIQUE: Multidetector CT imaging of the head and neck was performed using the standard protocol during bolus administration of intravenous contrast. Multiplanar CT image reconstructions and MIPs were obtained to evaluate the vascular anatomy. Carotid stenosis measurements (when applicable) are obtained utilizing NASCET criteria, using the distal internal carotid diameter as the denominator. CONTRAST:  50mL OMNIPAQUE IOHEXOL 350 MG/ML SOLN COMPARISON:  None. FINDINGS: CTA NECK Aortic arch: Normal variant 4 vessel aortic arch with the left vertebral artery arising directly from the arch. Moderate aortic arch atherosclerosis. Mild non stenotic plaque involving the brachiocephalic and right subclavian arteries. Moderate calcified plaque in the proximal left subclavian artery resulting in less than 50% narrowing. Right carotid system: Moderate calcified plaque at the carotid bifurcation results in approximately 50% proximal ICA stenosis. There is also severe proximal ECA stenosis. Left carotid system: Common carotid artery is patent without significant stenosis. There is extensive calcified and noncalcified plaque at the carotid bifurcation, and the ICA is occluded at its origin without evidence of reconstitution in the neck. Vertebral arteries: The vertebral arteries are patent with the right being dominant. No significant right vertebral artery stenosis is seen. There is likely mild left vertebral artery origin stenosis. Skeleton: Mild multilevel cervical disc degeneration. Other neck: Partially visualized coronary artery atherosclerosis. Scattered venous air likely related to recent venipuncture. CTA HEAD Anterior circulation: Intracranial right ICA is patent with mild calcified plaque but no significant stenosis. The left ICA reconstitutes at the horizontal petrous segment but is irregular and poorly opacified distal to this with  suspected moderate to severe focal stenosis in the anterior cavernous segment. The ICA terminus is patent, however there is occlusion of the proximal left M1 segment near its origin. Minimal collateral flow is present in distal MCA branch vessels. The right MCA is patent without evidence of significant proximal stenosis or major branch occlusion. The left A1 segment is hypoplastic. Right A1 segment is widely patent. A2 and more distal ACA branches demonstrate mild irregularity. No intracranial aneurysm is identified. Posterior circulation: Intracranial vertebral arteries are widely patent to the vertebrobasilar junction with the right being dominant. Right PICA origin is patent. Left PICA is not well seen. AICAs are also not well seen. SCA origins are patent. Basilar artery is patent without stenosis. There is a tiny right posterior communicating artery. PCAs are patent with mild branch vessel irregularity but no significant proximal stenosis. Venous sinuses: Not well evaluated due to arterial phase contrast timing. IMPRESSION: 1. 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 Reconstitution intracranially but with evidence of poor flow. 2. Left M1 MCA occlusion near its origin. Critical Value/emergent results were called by telephone at the time of interpretation  on 05/20/2015 at 9:55 pm to Dr. Ritta Slot , who verbally acknowledged these results. Electronically Signed   By: Sebastian Ache M.D.   On: 05/20/2015 22:13   Ct Head Wo Contrast  05/20/2015  CLINICAL DATA:  Code stroke. Right-sided paralysis and aphasia, acute onset. Initial encounter. EXAM: CT HEAD WITHOUT CONTRAST TECHNIQUE: Contiguous axial images were obtained from the base of the skull through the vertex without intravenous contrast. COMPARISON:  None. FINDINGS: There is no evidence of acute infarction, mass lesion, or intra- or extra-axial hemorrhage on CT. Prominence of ventricles and sulci reflects mild to moderate cortical volume loss. Mild  cerebellar atrophy is noted. Scattered periventricular and subcortical white matter change likely reflects small vessel ischemic microangiopathy. The brainstem and fourth ventricle are within normal limits. The basal ganglia are unremarkable in appearance. The cerebral hemispheres demonstrate grossly normal gray-white differentiation. No mass effect or midline shift is seen. There is no evidence of fracture; visualized osseous structures are unremarkable in appearance. The visualized portions of the orbits are within normal limits. The paranasal sinuses and mastoid air cells are well-aerated. No significant soft tissue abnormalities are seen. IMPRESSION: 1. No acute intracranial pathology seen on CT. 2. Mild to moderate cortical volume loss and scattered small vessel ischemic microangiopathy. These results were called by telephone at the time of interpretation on 05/20/2015 at 9:27 pm to Dr. Amada Jupiter, who verbally acknowledged these results. Electronically Signed   By: Roanna Raider M.D.   On: 05/20/2015 21:27     STUDIES:  CTA head 03/23 > left ICA occlusion, left M1 MCA occlusion. CT head 03/23 > no acute process. MRI brain 03/28 > Echo 03/28 >  CULTURES: Urine 03/28 >  ANTIBIOTICS: None.  SIGNIFICANT EVENTS: 03/27 > admitted with acute left MCA infarct. 03/28 > to IR for complete revascularization of occluded left MCA and left ICA as well as stent placement to left ICA.  Post procedure, he returned to the ICU on the ventilator and PCCM was called for vent management.  LINES/TUBES: ETT 03/27 >  DISCUSSION: 75 y.o. M admitted 03/27 with acute left MCA infarct.  Taken to IR for mechanical thrombectomy then returned to the ICU on the ventilator.  ASSESSMENT / PLAN:  NEUROLOGIC A:   Acute left MCA infarct - s/p IR where he had complete revascularization of occluded left MCA and left ICA as well as stent placement to left ICA. Acute encephalopathy - due to sedation. P:   Sedation:   Fentanyl gtt / Midazolam PRN. RASS goal: 0 to -1. Daily WUA. Neuro following. Stroke workup per neuro.  CARDIOVASCULAR A:  A.fib with RVR. Hx a.flutter s/p ablation 2016, HCM, HLD, HTN. P:  Monitor hemodynamics with sedation. May require further rate control. Hold outpatient toprol-xl, rosuvastatin, clopidogrel, lisinopril, apixaban.  PULMONARY A: VDRF - due to inability to protect airway in the setting of acute left MCA infarct. Tobacco use disorder. P:   Full vent support. Wean as able. VAP prevention measures. SBT in AM if able. CXR in AM. Tobacco cessation counseling once extubated.  RENAL A:   No acute issues. P:   NS @ 100. BMP in AM.  GASTROINTESTINAL A:   GI prophylaxis. Nutrition. P:   SUP: Pantoprazole. NPO.  HEMATOLOGIC A:   VTE Prophylaxis. P:  SCD's. CBC in AM.  INFECTIOUS A:   UTI. P:   Abx as above (Ceftriaxone). Follow cultures.  ENDOCRINE A:   No acute issues.   P:   Monitor glucose on BMP.  Family updated: Son and sister in law at bedside.  Interdisciplinary Family Meeting v Palliative Care Meeting:  Due by: 04/02.  CC time: 35 minutes.   Rutherford Guysahul Kalil Woessner, [place] - C [place] Pager: [phone]  or [phone] 05/20/2015, 10:53 PM

## 2015-05-20 NOTE — Anesthesia Preprocedure Evaluation (Addendum)
Anesthesia Evaluation  Patient identified by MRN, date of birth, ID band Patient unresponsive  Preop documentation limited or incomplete due to emergent nature of procedure.  Airway   TM Distance: >3 FB    Comment: Pt is uncooperative with exam.   Dental   Pulmonary Current Smoker,    breath sounds clear to auscultation       Cardiovascular hypertension,  Rhythm:regular Rate:Tachycardia     Neuro/Psych CVA    GI/Hepatic   Endo/Other    Renal/GU      Musculoskeletal   Abdominal   Peds  Hematology   Anesthesia Other Findings   Reproductive/Obstetrics                           Anesthesia Physical Anesthesia Plan  ASA: IV and emergent  Anesthesia Plan: General   Post-op Pain Management:    Induction: Intravenous  Airway Management Planned: Oral ETT  Additional Equipment:   Intra-op Plan:   Post-operative Plan: Possible Post-op intubation/ventilation  Informed Consent: I have reviewed the patients History and Physical, chart, labs and discussed the procedure including the risks, benefits and alternatives for the proposed anesthesia with the patient or authorized representative who has indicated his/her understanding and acceptance.     Plan Discussed with: CRNA, Anesthesiologist and Surgeon  Anesthesia Plan Comments:         Anesthesia Quick Evaluation

## 2015-05-20 NOTE — ED Provider Notes (Signed)
CSN: [id number]     Arrival date & time 05/20/15  2053 History   First MD Initiated Contact with Patient 05/20/15 2115     No chief complaint on file.   I evaluated the patient at the entrance to the emergency department, as he arrived with EMS. HPI Patient presents approximately 1.5 hours after developing weakness per Last known time was determined by the patient's son saw him normal, no to moments later noticed that he had asymmetric weakness, with less verbal. The patient himself is not speaking throughout the initial evaluation, level V caveat. Per report, by EMS, from the son, the patient has been in his usual state of health. He does have a history of recent cardiac ablation, but is not currently taking any anticoagulant. Level V caveat secondary to acuity of condition. Past Medical History  Diagnosis Date  . Hypertrophic cardiomyopathy   . Hyperlipidemia   . Hypertension   . Tobacco use disorder   . Dyspnea   . Dizziness    Past Surgical History  Procedure Laterality Date  . Back surgery x 2    . Rotator cuff surgery    . Insert / replace / remove pacemaker  2008   Family History  Problem Relation Age of Onset  . Cancer Sister 61   Social History  Substance Use Topics  . Smoking status: Current Every Day Smoker -- 0.50 packs/day    Types: Cigarettes  . Smokeless tobacco: Not on file  . Alcohol Use: No    Review of Systems  Unable to perform ROS: Acuity of condition      Allergies  Review of patient's allergies indicates no known allergies.  Home Medications   Prior to Admission medications   Medication Sig Start Date End Date Taking? Authorizing Provider  acetaminophen (TYLENOL) 650 MG CR tablet Take 650 mg by mouth. Take 2 tablets qd     Historical Provider, MD  aspirin 81 MG tablet Take 81 mg by mouth daily.      Historical Provider, MD  celecoxib (CELEBREX) 200 MG capsule Take 200 mg by mouth daily.      Historical Provider, MD  fish oil-omega-3 fatty  acids 1000 MG capsule Take 1 g by mouth daily.      Historical Provider, MD  metoprolol (TOPROL-XL) 50 MG 24 hr tablet Take 50 mg by mouth daily.      Historical Provider, MD  Multiple Vitamin (MULTIVITAMIN) capsule Take 1 capsule by mouth daily.      Historical Provider, MD  rosuvastatin (CRESTOR) 20 MG tablet Take 20 mg by mouth daily.      Historical Provider, MD   Wt 178 lb 9.2 oz (81 kg) Physical Exam  Constitutional: He appears well-developed and well-nourished.  Elderly male, right mild facial droop, incapable of moving right side.  HENT:  Head: Normocephalic and atraumatic.  Eyes: Conjunctivae and EOM are normal.  Cardiovascular: Normal rate and regular rhythm.   Pulmonary/Chest: Effort normal. No stridor. No respiratory distress.  Abdominal: He exhibits no distension.  Musculoskeletal: He exhibits no edema.  Neurological: He is alert. A cranial nerve deficit is present. He exhibits abnormal muscle tone.  Patient with right upper extremity flaccid paralysis, minimal  movement of the right lower extremity.  Skin: Skin is warm and dry.  Psychiatric: Cognition and memory are impaired. He is noncommunicative.  Nursing note and vitals reviewed.   ED Course  Procedures (including critical care time) Labs Review Labs Reviewed  CBC - Abnormal; Notable  for the following:    WBC 12.2 (*)    Platelets 140 (*)    All other components within normal limits  DIFFERENTIAL - Abnormal; Notable for the following:    Neutro Abs 9.9 (*)    All other components within normal limits  I-STAT TROPOININ, ED - Abnormal; Notable for the following:    Troponin i, poc 0.26 (*)    All other components within normal limits  I-STAT CHEM 8, ED - Abnormal; Notable for the following:    BUN 24 (*)    Glucose, Bld 161 (*)    Calcium, Ion 0.93 (*)    All other components within normal limits  PROTIME-INR  APTT  COMPREHENSIVE METABOLIC PANEL  CBG MONITORING, ED    Imaging Review Ct Angio Head W/cm  &/or Wo Cm  05/20/2015  CLINICAL DATA:  Right-sided weakness and aphasia. EXAM: CT ANGIOGRAPHY HEAD AND NECK TECHNIQUE: Multidetector CT imaging of the head and neck was performed using the standard protocol during bolus administration of intravenous contrast. Multiplanar CT image reconstructions and MIPs were obtained to evaluate the vascular anatomy. Carotid stenosis measurements (when applicable) are obtained utilizing NASCET criteria, using the distal internal carotid diameter as the denominator. CONTRAST:  50mL OMNIPAQUE IOHEXOL 350 MG/ML SOLN COMPARISON:  None. FINDINGS: CTA NECK Aortic arch: Normal variant 4 vessel aortic arch with the left vertebral artery arising directly from the arch. Moderate aortic arch atherosclerosis. Mild non stenotic plaque involving the brachiocephalic and right subclavian arteries. Moderate calcified plaque in the proximal left subclavian artery resulting in less than 50% narrowing. Right carotid system: Moderate calcified plaque at the carotid bifurcation results in approximately 50% proximal ICA stenosis. There is also severe proximal ECA stenosis. Left carotid system: Common carotid artery is patent without significant stenosis. There is extensive calcified and noncalcified plaque at the carotid bifurcation, and the ICA is occluded at its origin without evidence of reconstitution in the neck. Vertebral arteries: The vertebral arteries are patent with the right being dominant. No significant right vertebral artery stenosis is seen. There is likely mild left vertebral artery origin stenosis. Skeleton: Mild multilevel cervical disc degeneration. Other neck: Partially visualized coronary artery atherosclerosis. Scattered venous air likely related to recent venipuncture. CTA HEAD Anterior circulation: Intracranial right ICA is patent with mild calcified plaque but no significant stenosis. The left ICA reconstitutes at the horizontal petrous segment but is irregular and poorly  opacified distal to this with suspected moderate to severe focal stenosis in the anterior cavernous segment. The ICA terminus is patent, however there is occlusion of the proximal left M1 segment near its origin. Minimal collateral flow is present in distal MCA branch vessels. The right MCA is patent without evidence of significant proximal stenosis or major branch occlusion. The left A1 segment is hypoplastic. Right A1 segment is widely patent. A2 and more distal ACA branches demonstrate mild irregularity. No intracranial aneurysm is identified. Posterior circulation: Intracranial vertebral arteries are widely patent to the vertebrobasilar junction with the right being dominant. Right PICA origin is patent. Left PICA is not well seen. AICAs are also not well seen. SCA origins are patent. Basilar artery is patent without stenosis. There is a tiny right posterior communicating artery. PCAs are patent with mild branch vessel irregularity but no significant proximal stenosis. Venous sinuses: Not well evaluated due to arterial phase contrast timing. IMPRESSION: 1. 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 Reconstitution intracranially but with evidence of poor flow. 2. Left M1 MCA occlusion near its origin. Critical  Value/emergent results were called by telephone at the time of interpretation on 05/20/2015 at 9:55 pm to Dr. Ritta Slot , who verbally acknowledged these results. Electronically Signed   By: Sebastian Ache M.D.   On: 05/20/2015 22:13   Ct Head Wo Contrast  05/20/2015  CLINICAL DATA:  Code stroke. Right-sided paralysis and aphasia, acute onset. Initial encounter. EXAM: CT HEAD WITHOUT CONTRAST TECHNIQUE: Contiguous axial images were obtained from the base of the skull through the vertex without intravenous contrast. COMPARISON:  None. FINDINGS: There is no evidence of acute infarction, mass lesion, or intra- or extra-axial hemorrhage on CT. Prominence of ventricles and sulci reflects mild to moderate  cortical volume loss. Mild cerebellar atrophy is noted. Scattered periventricular and subcortical white matter change likely reflects small vessel ischemic microangiopathy. The brainstem and fourth ventricle are within normal limits. The basal ganglia are unremarkable in appearance. The cerebral hemispheres demonstrate grossly normal gray-white differentiation. No mass effect or midline shift is seen. There is no evidence of fracture; visualized osseous structures are unremarkable in appearance. The visualized portions of the orbits are within normal limits. The paranasal sinuses and mastoid air cells are well-aerated. No significant soft tissue abnormalities are seen. IMPRESSION: 1. No acute intracranial pathology seen on CT. 2. Mild to moderate cortical volume loss and scattered small vessel ischemic microangiopathy. These results were called by telephone at the time of interpretation on 05/20/2015 at 9:27 pm to Dr. Amada Jupiter, who verbally acknowledged these results. Electronically Signed   By: Roanna Raider M.D.   On: 05/20/2015 21:27   I have personally reviewed and evaluated these images and lab results as part of my medical decision-making.   EKG Interpretation   Date/Time:  Monday May 20 2015 21:21:55 EDT Ventricular Rate:  141 PR Interval:    QRS Duration: 91 QT Interval:  338 QTC Calculation: 518 R Axis:   67 Text Interpretation:  Age not entered, assumed to be  75 years old for  purpose of ECG interpretation Atrial flutter with 2:1 AV block LVH with  secondary repolarization abnormality Probable anterior infarct, age  indeterminate Prolonged QT interval Atrial fibrillation or aflutter w  variable block Left ventricular hypertrophy ST-t wave abnormality QT  prolonged abn Confirmed by Gerhard Munch  MD [phone]) on 05/20/2015  9:27:12 PM     9:42 PM Given the patient's tachycardia, discussed patient's case with cardiology and we considered means of rate control. Patient is found  to be on Eliquis.  So he is not a candidate for TPA.  Initial labs notable for elevated troponin. Patient is already on Eliquis, has history of arrhythmia, recent ablation  Given the patient's persistent hemiplegia, patient will be taken to the interventional radiology suite for endovascular procedure   MDM   Final diagnoses:  Stroke (cerebrum) (HCC)  Stroke (cerebrum) (HCC)   Patient presents less than 90 minutes after sudden change in neurologic status. Here, the patient is exhibiting physical exam findings consistent with stroke. Patient's initial head CT did not demonstrate hemorrhage, but CT angiography shows multiple occlusion of vessels. Patient had persistent right upper extremity paralysis. Patient required transfer to the interventional radiology suite for intravascular thrombectomy.  CRITICAL CARE Performed by: Gerhard Munch Total critical care time: 35 minutes Critical care time was exclusive of separately billable procedures and treating other patients. Critical care was necessary to treat or prevent imminent or life-threatening deterioration. Critical care was time spent personally by me on the following activities: development of treatment plan with patient and/or  surrogate as well as nursing, discussions with consultants, evaluation of patient's response to treatment, examination of patient, obtaining history from patient or surrogate, ordering and performing treatments and interventions, ordering and review of laboratory studies, ordering and review of radiographic studies, pulse oximetry and re-evaluation of patient's condition.    Gerhard Munch, MD 05/21/15 [id number]

## 2015-05-21 ENCOUNTER — Inpatient Hospital Stay (HOSPITAL_COMMUNITY): Payer: Medicare HMO

## 2015-05-21 ENCOUNTER — Encounter (HOSPITAL_COMMUNITY): Payer: Self-pay | Admitting: Radiology

## 2015-05-21 DIAGNOSIS — J9691 Respiratory failure, unspecified with hypoxia: Secondary | ICD-10-CM | POA: Diagnosis present

## 2015-05-21 DIAGNOSIS — I6350 Cerebral infarction due to unspecified occlusion or stenosis of unspecified cerebral artery: Secondary | ICD-10-CM

## 2015-05-21 DIAGNOSIS — I639 Cerebral infarction, unspecified: Secondary | ICD-10-CM | POA: Insufficient documentation

## 2015-05-21 DIAGNOSIS — I9589 Other hypotension: Secondary | ICD-10-CM

## 2015-05-21 DIAGNOSIS — J9601 Acute respiratory failure with hypoxia: Secondary | ICD-10-CM

## 2015-05-21 DIAGNOSIS — I63132 Cerebral infarction due to embolism of left carotid artery: Secondary | ICD-10-CM

## 2015-05-21 DIAGNOSIS — E785 Hyperlipidemia, unspecified: Secondary | ICD-10-CM

## 2015-05-21 DIAGNOSIS — Z9289 Personal history of other medical treatment: Secondary | ICD-10-CM | POA: Insufficient documentation

## 2015-05-21 DIAGNOSIS — N3000 Acute cystitis without hematuria: Secondary | ICD-10-CM

## 2015-05-21 LAB — LIPID PANEL
Cholesterol: 84 mg/dL (ref 0–200)
HDL: 29 mg/dL — AB (ref >40)
LDL CALC: 43 mg/dL (ref 0–99)
TRIGLYCERIDES: 59 mg/dL (ref <150)
Total CHOL/HDL Ratio: 2.9 RATIO
VLDL: 12 mg/dL (ref 0–40)

## 2015-05-21 LAB — CBC WITH DIFFERENTIAL/PLATELET
BASOS PCT: 0 %
Basophils Absolute: 0 10*3/uL (ref 0.0–0.1)
EOS ABS: 0.1 10*3/uL (ref 0.0–0.7)
Eosinophils Relative: 1 %
HEMATOCRIT: 37.9 % — AB (ref 39.0–52.0)
HEMOGLOBIN: 12.2 g/dL — AB (ref 13.0–17.0)
Lymphocytes Relative: 8 %
Lymphs Abs: 1 10*3/uL (ref 0.7–4.0)
MCH: 30.5 pg (ref 26.0–34.0)
MCHC: 32.2 g/dL (ref 30.0–36.0)
MCV: 94.8 fL (ref 78.0–100.0)
MONOS PCT: 5 %
Monocytes Absolute: 0.6 10*3/uL (ref 0.1–1.0)
NEUTROS ABS: 11.2 10*3/uL — AB (ref 1.7–7.7)
NEUTROS PCT: 87 %
Platelets: 115 10*3/uL — ABNORMAL LOW (ref 150–400)
RBC: 4 MIL/uL — AB (ref 4.22–5.81)
RDW: 14.1 % (ref 11.5–15.5)
WBC: 13 10*3/uL — AB (ref 4.0–10.5)

## 2015-05-21 LAB — BASIC METABOLIC PANEL
Anion gap: 6 (ref 5–15)
BUN: 15 mg/dL (ref 6–20)
CHLORIDE: 109 mmol/L (ref 101–111)
CO2: 22 mmol/L (ref 22–32)
CREATININE: 0.88 mg/dL (ref 0.61–1.24)
Calcium: 8.1 mg/dL — ABNORMAL LOW (ref 8.9–10.3)
GFR calc non Af Amer: >60 mL/min (ref >60)
Glucose, Bld: 138 mg/dL — ABNORMAL HIGH (ref 65–99)
POTASSIUM: 3.8 mmol/L (ref 3.5–5.1)
SODIUM: 137 mmol/L (ref 135–145)

## 2015-05-21 LAB — POCT I-STAT 3, ART BLOOD GAS (G3+)
ACID-BASE EXCESS: 1 mmol/L (ref 0.0–2.0)
Acid-base deficit: 4 mmol/L — ABNORMAL HIGH (ref 0.0–2.0)
BICARBONATE: 25.8 meq/L — AB (ref 20.0–24.0)
Bicarbonate: 21.6 mEq/L (ref 20.0–24.0)
O2 SAT: 99 %
O2 Saturation: 100 %
PO2 ART: 434 mmHg — AB (ref 80.0–100.0)
Patient temperature: 97.3
TCO2: 27 mmol/L (ref 0–100)
TCO2: 23 mmol/L (ref 0–100)
pCO2 arterial: 40.8 mmHg (ref 35.0–45.0)
pCO2 arterial: 40.9 mmHg (ref 35.0–45.0)
pH, Arterial: 7.406 (ref 7.350–7.450)
pH, Arterial: 7.335 — ABNORMAL LOW (ref 7.350–7.450)
pO2, Arterial: 129 mmHg — ABNORMAL HIGH (ref 80.0–100.0)

## 2015-05-21 LAB — MRSA PCR SCREENING: MRSA by PCR: NEGATIVE

## 2015-05-21 MED ORDER — FENTANYL CITRATE (PF) 100 MCG/2ML IJ SOLN
INTRAMUSCULAR | Status: AC
  Filled 2015-05-21: qty 2

## 2015-05-21 MED ORDER — ASPIRIN 325 MG PO TABS
325.0000 mg | ORAL_TABLET | Freq: Every day | ORAL | Status: DC
  Administered 2015-05-21 – 2015-05-23 (×3): 325 mg via ORAL
  Filled 2015-05-21 (×4): qty 1

## 2015-05-21 MED ORDER — MIDAZOLAM HCL 2 MG/2ML IJ SOLN: 1.0000 mg | INTRAMUSCULAR | Status: DC | PRN

## 2015-05-21 MED ORDER — ANTISEPTIC ORAL RINSE SOLUTION (CORINZ)
7.0000 mL | Freq: Four times a day (QID) | OROMUCOSAL | Status: DC
  Administered 2015-05-21 – 2015-05-22 (×4): 7 mL via OROMUCOSAL

## 2015-05-21 MED ORDER — PROPOFOL 1000 MG/100ML IV EMUL: 5.0000 ug/kg/min | INTRAVENOUS | Status: DC

## 2015-05-21 MED ORDER — CLOPIDOGREL BISULFATE 75 MG PO TABS
75.0000 mg | ORAL_TABLET | Freq: Every day | ORAL | Status: DC
  Administered 2015-05-21 – 2015-05-24 (×4): 75 mg via ORAL
  Filled 2015-05-21 (×4): qty 1

## 2015-05-21 MED ORDER — PANTOPRAZOLE SODIUM 40 MG IV SOLR
40.0000 mg | Freq: Every day | INTRAVENOUS | Status: DC
  Administered 2015-05-21 – 2015-05-22 (×2): 40 mg via INTRAVENOUS
  Filled 2015-05-21 (×2): qty 40

## 2015-05-21 MED ORDER — SODIUM CHLORIDE 0.9 % IV SOLN
25.0000 ug/h | INTRAVENOUS | Status: DC
  Administered 2015-05-21: 50 ug/h via INTRAVENOUS
  Filled 2015-05-21: qty 50

## 2015-05-21 MED ORDER — FENTANYL BOLUS VIA INFUSION
25.0000 ug | INTRAVENOUS | Status: DC | PRN
  Filled 2015-05-21: qty 25

## 2015-05-21 MED ORDER — NICARDIPINE HCL IN NACL 20-0.86 MG/200ML-% IV SOLN: 5.0000 mg/h | INTRAVENOUS | Status: DC

## 2015-05-21 MED ORDER — PROPOFOL 1000 MG/100ML IV EMUL
INTRAVENOUS | Status: AC
  Filled 2015-05-21: qty 100

## 2015-05-21 MED ORDER — SODIUM CHLORIDE 0.9 % IV BOLUS (SEPSIS): 1000.0000 mL | Freq: Once | INTRAVENOUS | Status: DC

## 2015-05-21 MED ORDER — DEXTROSE 5 % IV SOLN
1.0000 g | Freq: Every day | INTRAVENOUS | Status: AC
  Administered 2015-05-21 – 2015-05-23 (×3): 1 g via INTRAVENOUS
  Filled 2015-05-21 (×3): qty 10

## 2015-05-21 MED ORDER — AMIODARONE HCL IN DEXTROSE 360-4.14 MG/200ML-% IV SOLN
60.0000 mg/h | INTRAVENOUS | Status: DC
Start: 2015-05-21 — End: 2015-05-22
  Administered 2015-05-21 (×2): 60 mg/h via INTRAVENOUS
  Filled 2015-05-21 (×2): qty 200

## 2015-05-21 MED ORDER — AMIODARONE HCL IN DEXTROSE 360-4.14 MG/200ML-% IV SOLN
30.0000 mg/h | INTRAVENOUS | Status: DC
  Administered 2015-05-21 – 2015-05-22 (×3): 30 mg/h via INTRAVENOUS
  Filled 2015-05-21 (×2): qty 200

## 2015-05-21 MED ORDER — SODIUM CHLORIDE 0.9 % IV SOLN
INTRAVENOUS | Status: DC
Start: 2015-05-21 — End: 2015-05-22
  Administered 2015-05-21 – 2015-05-22 (×3): via INTRAVENOUS

## 2015-05-21 MED ORDER — AMIODARONE LOAD VIA INFUSION
150.0000 mg | Freq: Once | INTRAVENOUS | Status: AC
  Administered 2015-05-21: 150 mg via INTRAVENOUS
  Filled 2015-05-21: qty 83.34

## 2015-05-21 MED ORDER — DEXTROSE 5 % IV SOLN
30.0000 ug/min | INTRAVENOUS | Status: DC
  Administered 2015-05-21: 30 ug/min via INTRAVENOUS
  Filled 2015-05-21: qty 1

## 2015-05-21 MED ORDER — ACETAMINOPHEN 650 MG RE SUPP: 650.0000 mg | Freq: Four times a day (QID) | RECTAL | Status: DC | PRN

## 2015-05-21 MED ORDER — IOHEXOL 300 MG/ML  SOLN
300.0000 mL | Freq: Once | INTRAMUSCULAR | Status: AC | PRN
  Administered 2015-05-21: 175 mL via INTRA_ARTERIAL

## 2015-05-21 MED ORDER — FENTANYL CITRATE (PF) 100 MCG/2ML IJ SOLN
INTRAMUSCULAR | Status: AC
  Administered 2015-05-21: 50 ug via INTRAVENOUS
  Filled 2015-05-21: qty 2

## 2015-05-21 MED ORDER — CHLORHEXIDINE GLUCONATE 0.12% ORAL RINSE (MEDLINE KIT)
15.0000 mL | Freq: Two times a day (BID) | OROMUCOSAL | Status: DC
  Administered 2015-05-21 – 2015-05-23 (×5): 15 mL via OROMUCOSAL

## 2015-05-21 MED ORDER — FENTANYL CITRATE (PF) 100 MCG/2ML IJ SOLN
50.0000 ug | Freq: Once | INTRAMUSCULAR | Status: AC
  Administered 2015-05-21 (×2): 50 ug via INTRAVENOUS

## 2015-05-21 MED ORDER — ACETAMINOPHEN 500 MG PO TABS: 1000.0000 mg | ORAL_TABLET | Freq: Four times a day (QID) | ORAL | Status: DC | PRN

## 2015-05-21 MED ORDER — STROKE: EARLY STAGES OF RECOVERY BOOK
Freq: Once | Status: AC
  Administered 2015-05-21: 1
  Filled 2015-05-21 (×2): qty 1

## 2015-05-21 MED ORDER — ONDANSETRON HCL 4 MG/2ML IJ SOLN: 4.0000 mg | Freq: Four times a day (QID) | INTRAMUSCULAR | Status: DC | PRN

## 2015-05-21 NOTE — Progress Notes (Signed)
Patient is currently on [facility] with sats of 98%. All vitals are stable and patient is in no distress. BIPAP is in room on standby nut is not needed at this time. RN aware.

## 2015-05-21 NOTE — Progress Notes (Signed)
Patient transported to CT and back to room 3M06 without any complications.

## 2015-05-21 NOTE — Transfer of Care (Signed)
Immediate Anesthesia Transfer of Care Note  Patient: Luis Robinson  Procedure(s) Performed: Procedure(s): RADIOLOGY WITH ANESTHESIA (N/A)  Patient Location: ICU  Anesthesia Type:General  Level of Consciousness: sedated and Patient remains intubated per anesthesia plan  Airway & Oxygen Therapy: Patient remains intubated per anesthesia plan and Patient placed on Ventilator (see vital sign flow sheet for setting)  Post-op Assessment: Report given to RN and Post -op Vital signs reviewed and stable  Post vital signs: Reviewed and stable  Last Vitals:  Filed Vitals:   05/20/15 2200 05/20/15 2220  BP: 129/88 116/87  Pulse: 143   Temp:    Resp: 22     Complications: No apparent anesthesia complications

## 2015-05-21 NOTE — Procedures (Signed)
S/P Lt common carotisd arteriogram,followed by complete revascularization of occluded LT MCA M1 seg with x1 pass with the Solitaire FR814mm x 40 mm  retrieval device and a total of 2 mg of superselective  IA Integrelin and complete revascularization of symptomatic LT ICA occlusion with stent assisted angioplasty and 6 mg of IA Integrelin

## 2015-05-21 NOTE — Progress Notes (Signed)
SLP Cancellation Note  Patient Details Name: Thereasa SoloKenneth Everton MRN: [id number] DOB: 1940-10-10   Cancelled treatment:       Reason Eval/Treat Not Completed: Medical issues which prohibited therapy. Harlon DittyBonnie Teaghan [place], MA CCC-SLP [phone]    Claudine MoutonDeBlois, Dariah Mcsorley Caroline 05/21/2015, 1:26 PM

## 2015-05-21 NOTE — Progress Notes (Signed)
A 79fr sheath removed from the right groin as a exoseal deployed at 1325. Manual pressure held for 20 minutes and hemostasis achieved.  Right groin dressed with gauze and tegaderm and sight free of hematoma. Right distal pulses present. A 484fr sheath removed from left groin at 1335 and manual pressure held for 20minutes. Hemostasis achieved and site dressed with gauze and tegaderm. Left distal pulses unchanged and present.

## 2015-05-21 NOTE — Progress Notes (Signed)
PULMONARY / CRITICAL CARE MEDICINE   Name: Luis Robinson MRN: [id number] DOB: 01-26-1941    ADMISSION DATE:  05/20/2015 CONSULTATION DATE:  05/20/15  REFERRING MD:  Amada Jupiter  CHIEF COMPLAINT:  R sided weakness  HISTORY OF PRESENT ILLNESS:  Pt is aphasic; therefore, this HPI is obtained from chart review. Luis Robinson is a 75 y.o. male with PMH as outlined below. He was brought to [facility] ED 03/27 due to sudden onset right sided weakness and aphasia.  Symptoms began suddenly that evening and son initially thought pt was having difficult time swallowing; however, turns out he wasn't able to speak.  Son was with him but had left the room and upon leaving, thought he heard something fall.  When he returned, he noticed the weakness and aphasia.  In ED, he had CTA of teh brain which revealed acute left MCA infarct.  IR was consulted and pt is to be taken to IR suite for possible mechanical thrombectomy performed.   In IR, he had complete revascularization of occluded left MCA and left ICA as well as stent placement to left ICA.  Post procedure, he returned to the ICU on the ventilator and PCCM was called for vent management.    SUBJECTIVE:  Intubated, critically ill Afebrile Poor UO, condom  VITAL SIGNS: BP 122/95 mmHg  Pulse 138  Temp(Src) 99.3 F (37.4 C) (Core (Comment))  Resp 12  Ht 6' (1.829 m)  Wt 191 lb 12.8 oz (87 kg)  BMI 26.01 kg/m2  SpO2 100%  HEMODYNAMICS:    VENTILATOR SETTINGS: Vent Mode:  [-] PRVC FiO2 (%):  [40 %-100 %] 40 % Set Rate:  [14 bmp] 14 bmp Vt Set:  [620 mL] 620 mL PEEP:  [5 cmH20] 5 cmH20 Plateau Pressure:  [23 cmH20-26 cmH20] 23 cmH20  INTAKE / OUTPUT: I/O last 3 completed shifts: In: 2706.3 [I.V.:2406.3; IV Piggyback:300] Out: 350 [Urine:325; Blood:25]   PHYSICAL EXAMINATION: General: Adult male, in NAD. Neuro: Awake.  Aphasic.  RUE and RLE weakness. HEENT: [place]/AT. PERRL, sclerae anicteric. Cardiovascular: IRIR, no M/R/G.  Lungs:  Respirations even and unlabored.  CTA bilaterally, No W/R/R. Abdomen: BS x 4, soft, NT/ND.  Musculoskeletal: No gross deformities, no edema.  Skin: Intact, warm, no rashes.  LABS:  BMET  Recent Labs Lab 05/20/15 2110 05/20/15 2115 05/21/15 0523  NA 137 138 137  K 3.8 4.1 3.8  CL 106 106 109  CO2  --  22 22  BUN 24* 20 15  CREATININE 1.00 1.07 0.88  GLUCOSE 161* 165* 138*    Electrolytes  Recent Labs Lab 05/20/15 2115 05/21/15 0523  CALCIUM 9.2 8.1*    CBC  Recent Labs Lab 05/20/15 2110 05/20/15 2115 05/21/15 0523  WBC  --  12.2* 13.0*  HGB 16.3 14.9 12.2*  HCT 48.0 45.7 37.9*  PLT  --  140* 115*    Coag's  Recent Labs Lab 05/20/15 2115  APTT 31  INR 1.21    Sepsis Markers No results for input(s): LATICACIDVEN, PROCALCITON, O2SATVEN in the last 168 hours.  ABG  Recent Labs Lab 05/21/15 0259  PHART 7.406  PCO2ART 40.8  PO2ART 434.0*    Liver Enzymes  Recent Labs Lab 05/20/15 2115  AST 32  ALT 22  ALKPHOS 49  BILITOT 0.6  ALBUMIN 3.6    Cardiac Enzymes No results for input(s): TROPONINI, PROBNP in the last 168 hours.  Glucose  Recent Labs Lab 05/20/15 2158  GLUCAP 140*    Imaging Ct Angio Head W/cm &/  or Wo Cm  05/20/2015  CLINICAL DATA:  Right-sided weakness and aphasia. EXAM: CT ANGIOGRAPHY HEAD AND NECK TECHNIQUE: Multidetector CT imaging of the head and neck was performed using the standard protocol during bolus administration of intravenous contrast. Multiplanar CT image reconstructions and MIPs were obtained to evaluate the vascular anatomy. Carotid stenosis measurements (when applicable) are obtained utilizing NASCET criteria, using the distal internal carotid diameter as the denominator. CONTRAST:  50mL OMNIPAQUE IOHEXOL 350 MG/ML SOLN COMPARISON:  None. FINDINGS: CTA NECK Aortic arch: Normal variant 4 vessel aortic arch with the left vertebral artery arising directly from the arch. Moderate aortic arch atherosclerosis.  Mild non stenotic plaque involving the brachiocephalic and right subclavian arteries. Moderate calcified plaque in the proximal left subclavian artery resulting in less than 50% narrowing. Right carotid system: Moderate calcified plaque at the carotid bifurcation results in approximately 50% proximal ICA stenosis. There is also severe proximal ECA stenosis. Left carotid system: Common carotid artery is patent without significant stenosis. There is extensive calcified and noncalcified plaque at the carotid bifurcation, and the ICA is occluded at its origin without evidence of reconstitution in the neck. Vertebral arteries: The vertebral arteries are patent with the right being dominant. No significant right vertebral artery stenosis is seen. There is likely mild left vertebral artery origin stenosis. Skeleton: Mild multilevel cervical disc degeneration. Other neck: Partially visualized coronary artery atherosclerosis. Scattered venous air likely related to recent venipuncture. CTA HEAD Anterior circulation: Intracranial right ICA is patent with mild calcified plaque but no significant stenosis. The left ICA reconstitutes at the horizontal petrous segment but is irregular and poorly opacified distal to this with suspected moderate to severe focal stenosis in the anterior cavernous segment. The ICA terminus is patent, however there is occlusion of the proximal left M1 segment near its origin. Minimal collateral flow is present in distal MCA branch vessels. The right MCA is patent without evidence of significant proximal stenosis or major branch occlusion. The left A1 segment is hypoplastic. Right A1 segment is widely patent. A2 and more distal ACA branches demonstrate mild irregularity. No intracranial aneurysm is identified. Posterior circulation: Intracranial vertebral arteries are widely patent to the vertebrobasilar junction with the right being dominant. Right PICA origin is patent. Left PICA is not well seen.  AICAs are also not well seen. SCA origins are patent. Basilar artery is patent without stenosis. There is a tiny right posterior communicating artery. PCAs are patent with mild branch vessel irregularity but no significant proximal stenosis. Venous sinuses: Not well evaluated due to arterial phase contrast timing. IMPRESSION: 1. 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 Reconstitution intracranially but with evidence of poor flow. 2. Left M1 MCA occlusion near its origin. Critical Value/emergent results were called by telephone at the time of interpretation on 05/20/2015 at 9:55 pm to Dr. Ritta SlotMCNEILL KIRKPATRICK , who verbally acknowledged these results. Electronically Signed   By: Sebastian AcheAllen  Grady M.D.   On: 05/20/2015 22:13   Ct Head Wo Contrast  05/21/2015  CLINICAL DATA:  Initial evaluation status post left common carotid arteriogram. EXAM: CT HEAD WITHOUT CONTRAST TECHNIQUE: Contiguous axial images were obtained from the base of the skull through the vertex without intravenous contrast. COMPARISON:  Prior studies from 05/20/2015 FINDINGS: Age-related cerebral atrophy with chronic microvascular ischemic disease again noted. There is new patchy hyperdensity involving the anterior left operculum and insular region, likely contrast staining from recent arteriogram. Small amount of hemorrhage 9 entirely excluded. Involvement of the left lentiform nucleus present. No other acute  intracranial hemorrhage. No other acute large vessel territory infarct. No extra-axial fluid collection. No hydrocephalus. Scalp soft tissues within normal limits. No acute abnormality about the orbits. Mild mucosal thickening within the ethmoidal air cells. Paranasal sinuses are otherwise clear. No mastoid effusion. Calvarium intact. IMPRESSION: 1. Scattered parenchymal hyperdensity within the anterior left MCA distribution, likely contrast staining from recent catheter directed arteriogram. Small amount of hemorrhage not excluded. Attention at follow-up  recommended. 2. Otherwise stable head CT. Results were called by telephone at the time of interpretation on 05/21/2015 at 1:57 am to Dr. Amada Jupiter, who verbally acknowledged these results. Electronically Signed   By: Rise Mu M.D.   On: 05/21/2015 02:04   Ct Head Wo Contrast  05/20/2015  CLINICAL DATA:  Code stroke. Right-sided paralysis and aphasia, acute onset. Initial encounter. EXAM: CT HEAD WITHOUT CONTRAST TECHNIQUE: Contiguous axial images were obtained from the base of the skull through the vertex without intravenous contrast. COMPARISON:  None. FINDINGS: There is no evidence of acute infarction, mass lesion, or intra- or extra-axial hemorrhage on CT. Prominence of ventricles and sulci reflects mild to moderate cortical volume loss. Mild cerebellar atrophy is noted. Scattered periventricular and subcortical white matter change likely reflects small vessel ischemic microangiopathy. The brainstem and fourth ventricle are within normal limits. The basal ganglia are unremarkable in appearance. The cerebral hemispheres demonstrate grossly normal gray-white differentiation. No mass effect or midline shift is seen. There is no evidence of fracture; visualized osseous structures are unremarkable in appearance. The visualized portions of the orbits are within normal limits. The paranasal sinuses and mastoid air cells are well-aerated. No significant soft tissue abnormalities are seen. IMPRESSION: 1. No acute intracranial pathology seen on CT. 2. Mild to moderate cortical volume loss and scattered small vessel ischemic microangiopathy. These results were called by telephone at the time of interpretation on 05/20/2015 at 9:27 pm to Dr. Amada Jupiter, who verbally acknowledged these results. Electronically Signed   By: Roanna Raider M.D.   On: 05/20/2015 21:27   Ct Angio Neck W/cm &/or Wo/cm  05/20/2015  CLINICAL DATA:  Right-sided weakness and aphasia. EXAM: CT ANGIOGRAPHY HEAD AND NECK TECHNIQUE:  Multidetector CT imaging of the head and neck was performed using the standard protocol during bolus administration of intravenous contrast. Multiplanar CT image reconstructions and MIPs were obtained to evaluate the vascular anatomy. Carotid stenosis measurements (when applicable) are obtained utilizing NASCET criteria, using the distal internal carotid diameter as the denominator. CONTRAST:  50mL OMNIPAQUE IOHEXOL 350 MG/ML SOLN COMPARISON:  None. FINDINGS: CTA NECK Aortic arch: Normal variant 4 vessel aortic arch with the left vertebral artery arising directly from the arch. Moderate aortic arch atherosclerosis. Mild non stenotic plaque involving the brachiocephalic and right subclavian arteries. Moderate calcified plaque in the proximal left subclavian artery resulting in less than 50% narrowing. Right carotid system: Moderate calcified plaque at the carotid bifurcation results in approximately 50% proximal ICA stenosis. There is also severe proximal ECA stenosis. Left carotid system: Common carotid artery is patent without significant stenosis. There is extensive calcified and noncalcified plaque at the carotid bifurcation, and the ICA is occluded at its origin without evidence of reconstitution in the neck. Vertebral arteries: The vertebral arteries are patent with the right being dominant. No significant right vertebral artery stenosis is seen. There is likely mild left vertebral artery origin stenosis. Skeleton: Mild multilevel cervical disc degeneration. Other neck: Partially visualized coronary artery atherosclerosis. Scattered venous air likely related to recent venipuncture. CTA HEAD Anterior circulation: Intracranial right  ICA is patent with mild calcified plaque but no significant stenosis. The left ICA reconstitutes at the horizontal petrous segment but is irregular and poorly opacified distal to this with suspected moderate to severe focal stenosis in the anterior cavernous segment. The ICA terminus  is patent, however there is occlusion of the proximal left M1 segment near its origin. Minimal collateral flow is present in distal MCA branch vessels. The right MCA is patent without evidence of significant proximal stenosis or major branch occlusion. The left A1 segment is hypoplastic. Right A1 segment is widely patent. A2 and more distal ACA branches demonstrate mild irregularity. No intracranial aneurysm is identified. Posterior circulation: Intracranial vertebral arteries are widely patent to the vertebrobasilar junction with the right being dominant. Right PICA origin is patent. Left PICA is not well seen. AICAs are also not well seen. SCA origins are patent. Basilar artery is patent without stenosis. There is a tiny right posterior communicating artery. PCAs are patent with mild branch vessel irregularity but no significant proximal stenosis. Venous sinuses: Not well evaluated due to arterial phase contrast timing. IMPRESSION: 1. 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 Reconstitution intracranially but with evidence of poor flow. 2. Left M1 MCA occlusion near its origin. Critical Value/emergent results were called by telephone at the time of interpretation on 05/20/2015 at 9:55 pm to Dr. Ritta Slot , who verbally acknowledged these results. Electronically Signed   By: Sebastian Ache M.D.   On: 05/20/2015 22:13   Dg Chest Port 1 View  05/21/2015  CLINICAL DATA:  Endotracheal tube placement.  Initial encounter. EXAM: PORTABLE CHEST 1 VIEW COMPARISON:  None. FINDINGS: The patient's endotracheal tube is seen ending 4-5 cm above the carina. An enteric tube is noted extending below diaphragm. The lungs are well-aerated. Vascular congestion is noted, with minimal bilateral atelectasis. There is no evidence of pleural effusion or pneumothorax. The cardiomediastinal silhouette is borderline normal in size. A pacemaker is noted overlying the left chest wall, with leads ending overlying the right atrium and right  ventricle. No acute osseous abnormalities are seen. IMPRESSION: 1. Endotracheal tube seen ending 4-5 cm above the carina. 2. Vascular congestion, with minimal bilateral atelectasis. Electronically Signed   By: Roanna Raider M.D.   On: 05/21/2015 02:29   Dg Abd Portable 1v  05/21/2015  CLINICAL DATA:  Orogastric tube placement.  Initial encounter. EXAM: PORTABLE ABDOMEN - 1 VIEW COMPARISON:  None. FINDINGS: The patient's enteric tube is noted ending overlying the body of the stomach. Its side-port is noted about the fundus of the stomach. The visualized bowel gas pattern is grossly unremarkable. Contrast is noted within the right renal collecting system. No free intra-abdominal air is seen, though evaluation for free air is limited on a single supine view. No acute osseous abnormalities are identified. Pacemaker leads are partially imaged, with a left chest wall pacemaker seen. The visualized portions of the lungs are grossly clear. IMPRESSION: Enteric tube noted ending overlying the body of the stomach. Electronically Signed   By: Roanna Raider M.D.   On: 05/21/2015 02:30     STUDIES:  CTA head 03/23 > left ICA occlusion, left M1 MCA occlusion. CT head 03/23 > no acute process. MRI brain 03/28 > Echo 03/28 >  CULTURES: Urine 03/28 >  ANTIBIOTICS: None.  SIGNIFICANT EVENTS: 03/27 > admitted with acute left MCA infarct. 03/28 > to IR for complete revascularization of occluded left MCA and left ICA as well as stent placement to left ICA.   LINES/TUBES:  ETT 03/27 >  DISCUSSION: 75 y.o. M admitted 03/27 with acute left MCA infarct.  Taken to IR for mechanical thrombectomy then returned to the ICU on the ventilator.  ASSESSMENT / PLAN:  NEUROLOGIC A:   Acute left MCA infarct - s/p IR where he had complete revascularization of occluded left MCA and left ICA as well as stent placement to left ICA. Acute encephalopathy - due to sedation. P:   Sedation:  Fentanyl gtt / Midazolam  PRN. RASS goal: 0 to -1. Daily WUA. Neuro following. Stroke workup per neuro. Sheath removal being delayed due to eliquis on board  CARDIOVASCULAR A:  A.fib with RVR. Hx a.flutter s/p ablation 2016, HCM, HLD, HTN. P:  Amio gtt for RVR Hold outpatient toprol-xl, rosuvastatin, clopidogrel, lisinopril, apixaban.  PULMONARY A: VDRF - due to inability to protect airway in the setting of acute left MCA infarct. Tobacco use disorder. P:   Full vent support. SBts once sheaths out  VAP prevention measures. Tobacco cessation counseling once extubated.  RENAL A:   No acute issues. P:   NS @ 100. BMP in AM. Monitor for foley need  GASTROINTESTINAL A:   GI prophylaxis. Nutrition. P:   SUP: Pantoprazole. NPO.  HEMATOLOGIC A:   VTE Prophylaxis. P:  SCD's. CBC in AM.  INFECTIOUS A:   UTI. P:   Abx as above (Ceftriaxone). Follow cultures.  ENDOCRINE A:   No acute issues.   P:   Monitor glucose on BMP.   Family updated: Son and sister in law at bedside 3/27  Interdisciplinary Family Meeting v Palliative Care Meeting:  Due by: 04/02.  The patient is critically ill with multiple organ systems failure and requires high complexity decision making for assessment and support, frequent evaluation and titration of therapies, application of advanced monitoring technologies and extensive interpretation of multiple databases. Critical Care Time devoted to patient care services described in this note independent of APP time is 32 minutes.   Cyril Mourning MD. Tonny Bollman. [facility] Pager [phone] If no response call [phone]   05/21/2015    05/21/2015, 8:59 AM

## 2015-05-21 NOTE — Progress Notes (Signed)
Initial Nutrition Assessment  DOCUMENTATION CODES:   Not applicable  INTERVENTION:  -If unable to extubate, recommend begin Vital 1.2 @ 5420mL/hr via OGT, increase by 10 every 6 hours to goal rate of 570mL/hr  -TF regimen provides 2000 calories, 125g protein, 1297cc free water  -RD will continue to monitor for needs  NUTRITION DIAGNOSIS:   Inadequate oral intake related to inability to eat as evidenced by NPO status.  GOAL:   Patient will meet greater than or equal to 90% of their needs  MONITOR:   Vent status, Labs, I & O's, TF tolerance, Skin  REASON FOR ASSESSMENT:   Ventilator    ASSESSMENT:   [name] is a 75 y.o. male with a history of cardiomyopathy as well as "history of ablation." Who presents with new onset right-sided weakness and aphasia occurring in the setting of atrial fibrillation. He has a history of "a flutter in his heart"  Patient is currently intubated on ventilator support MV: 5.4 L/min Temp (24hrs), Avg:99 F (37.2 C), Min:97.3 F (36.3 C), Max:100 F (37.8 C)  Propofol: None  (it is ordered in chart however)  Pt was intubated, sedated, no family at bedside.  Per chart: Pt had a L MCA Infarct last night after dinner. Presented to IR > s/p revascularization of occluded L MCA as well as stent placement. Remains intubated for post-operative respiratory failure.  Currently undergoing SBT - > weaning.  Labs reviewed. Medications: Neo drip; Versed PRN; Fentanyl PRN  Diet Order:  Diet NPO time specified  Skin:  Reviewed, no issues  Last BM:  PTA  Height:   Ht Readings from Last 1 Encounters:  05/21/15 6' (1.829 m)    Weight:   Wt Readings from Last 1 Encounters:  05/21/15 191 lb 12.8 oz (87 kg)    Ideal Body Weight:  80.9 kg  BMI:  Body mass index is 26.01 kg/(m^2).  Estimated Nutritional Needs:   Kcal:  2036  Protein:  104-130.5  Fluid:  >/= 2036  EDUCATION NEEDS:   No education needs identified at this  time  Luis AnoWilliam M. Calle Schader, MS, RD LDN After Hours/Weekend Pager [phone]

## 2015-05-21 NOTE — Progress Notes (Signed)
Wasted 190ml of Fentanyl 3010mcg/ml in sink with Janora NorlanderBethany Cuthberson, RN as witness. Nthony Lefferts C 05/21/2015 6:35 PM

## 2015-05-21 NOTE — Progress Notes (Signed)
eLink Physician-Brief Progress Note Patient Name: Luis SoloKenneth Michetti DOB: 1940-04-23 MRN: [id number]   Date of Service  05/21/2015  HPI/Events of Note  Pt tolerating weaning trial 5/5, abg shows conmpensated.   eICU Interventions  Extubate.  Has OSA, will order bipap qhs.      Intervention Category Major Interventions: Respiratory failure - evaluation and management  Shane Crutchradeep Hogan Hoobler 05/21/2015, 5:39 PM

## 2015-05-21 NOTE — Care Management Note (Signed)
Case Management Note  Patient Details  Name: Thereasa SoloKenneth Johanson MRN: [id number] Date of Birth: [birth year]  Subjective/Objective:      Pt admitted on 05/20/15 s/p acute Lt MCA infarct.  PTA, pt independent, lives with brother.               Action/Plan: Will follow for discharge planning as pt progresses.  PT/OT evals pending.    Expected Discharge Date:                  Expected Discharge Plan:  IP Rehab Facility  In-House Referral:     Discharge planning Services  CM Consult  Post Acute Care Choice:    Choice offered to:     DME Arranged:    DME Agency:     HH Arranged:    HH Agency:     Status of Service:  In process, will continue to follow  Medicare Important Message Given:    Date Medicare IM Given:    Medicare IM give by:    Date Additional Medicare IM Given:    Additional Medicare Important Message give by:     If discussed at Long Length of Stay Meetings, dates discussed:    Additional Comments:  Quintella BatonJulie W. Leonore Frankson, RN, BSN  Trauma/Neuro ICU Case Manager [phone]

## 2015-05-21 NOTE — Progress Notes (Signed)
STROKE TEAM PROGRESS NOTE   HISTORY OF PRESENT ILLNESS Luis Robinson is a 75 y.o. male with a history of cardiomyopathy as well as "history of ablation." Who presents with new onset right-sided weakness and aphasia occurring in the setting of atrial fibrillation. He has a history of "a flutter in his heart". His son was with him at onset, left the room for a minute and heard something fall and on his return found him to be weak on the right side with difficulty speaking. 7:30 AM on 05/20/2015. Premorbid modified rankin scale: 1. Patient was not administered IV t-PA as he is on anticoagulation. CTA showed a M1 occlusion with left ICA occlusion at the origin. He was taken to neuro-intervention where he received complete revascularization of L M1 with mechanical thrombectomy and IA Integrilin; and complete revascularization of left ICA with stent-assisted angioplasty and IA Integrilin. He was admitted to the neuro ICU for further evaluation and treatment.   SUBJECTIVE (INTERVAL HISTORY) His son and daughter are at the bedside.  Overall his condition is gradually improving. He is able to move right arm and leg strongly. He is still intubated but off sedation, able to follow some central commands but not peripheral commands yet. Still has femoral sheath in bilaterally. Need to repeat CT head. On ASA and plavix.   OBJECTIVE Temp:  [97.3 F (36.3 C)-99.1 F (37.3 C)] 99.1 F (37.3 C) (03/28 0715) Pulse Rate:  [131-145] 145 (03/28 0715) Cardiac Rhythm:  [-]  Resp:  [0-28] 14 (03/28 0715) BP: (88-132)/(77-94) 100/87 mmHg (03/28 0700) SpO2:  [99 %-100 %] 100 % (03/28 0715) Arterial Line BP: (83-136)/(59-87) 113/72 mmHg (03/28 0715) FiO2 (%):  [40 %-100 %] 40 % (03/28 0301) Weight:  [81 kg (178 lb 9.2 oz)-87 kg (191 lb 12.8 oz)] 87 kg (191 lb 12.8 oz) (03/28 0140)  CBC:  Recent Labs Lab 05/20/15 2115 05/21/15 0523  WBC 12.2* 13.0*  NEUTROABS 9.9* 11.2*  HGB 14.9 12.2*  HCT 45.7 37.9*  MCV  94.4 94.8  PLT 140* 115*    Basic Metabolic Panel:  Recent Labs Lab 05/20/15 2115 05/21/15 0523  NA 138 137  K 4.1 3.8  CL 106 109  CO2 22 22  GLUCOSE 165* 138*  BUN 20 15  CREATININE 1.07 0.88  CALCIUM 9.2 8.1*    Lipid Panel:    Component Value Date/Time   CHOL 84 05/21/2015 0522   TRIG 59 05/21/2015 0522   HDL 29* 05/21/2015 0522   CHOLHDL 2.9 05/21/2015 0522   VLDL 12 05/21/2015 0522   LDLCALC 43 05/21/2015 0522   HgbA1c: No results found for: HGBA1C Urine Drug Screen:    Component Value Date/Time   LABOPIA NONE DETECTED 05/20/2015 2142   COCAINSCRNUR NONE DETECTED 05/20/2015 2142   LABBENZ NONE DETECTED 05/20/2015 2142   AMPHETMU NONE DETECTED 05/20/2015 2142   THCU NONE DETECTED 05/20/2015 2142   LABBARB NONE DETECTED 05/20/2015 2142      IMAGING I have personally reviewed the radiological images below and agree with the radiology interpretations.  Ct Angio Head and neck W/cm &/or Wo Cm  05/20/2015  IMPRESSION: 1. 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 Reconstitution intracranially but with evidence of poor flow. 2. Left M1 MCA occlusion near its origin.   Ct Head Wo Contrast  05/21/2015  IMPRESSION: 1. Scattered parenchymal hyperdensity within the anterior left MCA distribution, likely contrast staining from recent catheter directed arteriogram. Small amount of hemorrhage not excluded. Attention at follow-up recommended. 2. Otherwise stable  head CT.  05/20/2015  IMPRESSION: 1. No acute intracranial pathology seen on CT. 2. Mild to moderate cortical volume loss and scattered small vessel ischemic microangiopathy.   Cerebral angio -   S/P Lt common carotisd arteriogram,followed by complete revascularization of occluded LT MCA M1 seg with x1 pass with the Solitaire FR80mm x 40 mm retrieval device and a total of 2 mg of superselective IA Integrelin and complete revascularization of symptomatic LT ICA occlusion with stent assisted angioplasty and 6 mg of IA  Integrelin  Repeat CT head - pending  2D echo - pending   Physical exam  Temp:  [97.3 F (36.3 C)-99.5 F (37.5 C)] 99.5 F (37.5 C) (03/28 1100) Pulse Rate:  [57-145] 62 (03/28 1126) Resp:  [0-28] 22 (03/28 1126) BP: (86-154)/(65-95) 112/74 mmHg (03/28 1126) SpO2:  [99 %-100 %] 100 % (03/28 1126) Arterial Line BP: (83-155)/(59-102) 107/86 mmHg (03/28 1100) FiO2 (%):  [40 %-100 %] 40 % (03/28 1126) Weight:  [178 lb 9.2 oz (81 kg)-191 lb 12.8 oz (87 kg)] 191 lb 12.8 oz (87 kg) (03/28 0140)  General - Well nourished, well developed, intubated but off sedation.  Ophthalmologic - Fundi not visualized due to noncooperation.  Cardiovascular - afib with RVR switching to NSR during rounds.  Neuro - intubated off sedation, open eyes on voice, followed central commands with eye opening and closing, but not tongue protrusion, not able to follow peripheral commands. Left gaze preference, but able to cross midline, moving both UEs grossly equal, LEs difficult to check due to femoral sheath. Left dorsiflexion 4/5, right dorsiflexion 2/5. DTR 1+ and no babinski.   ASSESSMENT/PLAN [name] is a 75 y.o. male with history of atrial fibrillation on anticoagulation presenting with right-sided weakness. He did not receive IV t-PA due to being on anticoagulation. He received complete revascularization of L M1 with mechanical thrombectomy and IA Integrilin; and complete revascularization of left ICA with stent-assisted angioplasty and IA Integrilin.  Stroke:  Dominant left MCA infarct secondary to left ICA/MCA occlusion, embolic secondary to known atrial fibrillation. s/p complete revascularization with mechanical thrombectomy, stent-assisted angioplasty and IA Integrilin  Resultant  Aphasia, right mild hemiparesis  MRI / MRA - pacemaker  CTA head and neck - left ICA and MCA occlusion  CT head repeat pending  2D Echo  pending  LDL 43  HgbA1c pending  SCDs for VTE  prophylaxis  Diet NPO time specified  Eliquis (apixaban) daily prior to admission, now on aspirin 81 mg daily and clopidogrel 75 mg daily  Patient counseled to be compliant with his antithrombotic medications  Ongoing aggressive stroke risk factor management  Therapy recommendations:  pending  Disposition:  pending  Atrial Fibrillation with RVR  Home anticoagulation:  Eliquis (apixaban) daily continued in the hospital  RVR, uncontrolled. Started on amiodarone after failed beta blockers  afib RVR switching to NSR during round  Home metoprolol on hold due to low BP  Left ICA occlusion  S/p mechanical thrombectomy  S/p left ICA stent  On DAPT   Postoperative respiratory failure  Intubated for neuro intervention  CCM on board  Hypotension  BP on the low side  Hold off beta blocker for now  Permissive hypertension (OK if < 180/105) but gradually normalize in 5-7 days  Hyperlipidemia  Home meds:  Crestor 20 mg daily  LDL 43, goal < 70  Resume Crestor once able to swallow or NG placed  UTI  UA WBC TNTC  On rocephin  Other Stroke Risk  Factors  Advanced age  Cigarette smoker, advised to stop smoking  Hypertrophic cardiomyopathy  Other Active Problems  S/p pacer  Hospital day # 1  This patient is critically ill due to left ICA and MCA occlusion, left MCA infarct, afib RVR uncontrolled, respiratogy failure and at significant risk of neurological worsening, death form recurrent infarcts, hemorrhagic transformation, heart failure, cerebral edema. This patient's care requires constant monitoring of vital signs, hemodynamics, respiratory and cardiac monitoring, review of multiple databases, neurological assessment, discussion with family, other specialists and medical decision making of high complexity. I spent 45 minutes of neurocritical care time in the care of this patient.  Marvel Plan, MD PhD Stroke Neurology 05/21/2015 12:57 PM    To contact  Stroke Continuity provider, please refer to [email or web address]. After hours, contact General Neurology

## 2015-05-21 NOTE — Progress Notes (Signed)
Pharmacy Antibiotic Note  [name] is a 75 y.o. male admitted on 05/20/2015 with UTI.  Pharmacy has been consulted for Rocephin dosing.  Plan: Rocephin 1gm IV q24h Pharmacy will sign off - please reconsult if needed  Height: 6' (182.9 cm) Weight: 191 lb 12.8 oz (87 kg) IBW/kg (Calculated) : 77.6  Temp (24hrs), Avg:97.5 F (36.4 C), Min:97.3 F (36.3 C), Max:97.8 F (36.6 C)   Recent Labs Lab 05/20/15 2110 05/20/15 2115  WBC  --  12.2*  CREATININE 1.00 1.07    Estimated Creatinine Clearance: 66.5 mL/min (by C-G formula based on Cr of 1.07).    Allergies  Allergen Reactions  . Bee Venom Anaphylaxis    Antimicrobials this admission: 3/28 Rocephin >>   Microbiology results:  UCx: pending  Thank you for allowing pharmacy to be a part of this patient's care.  Christoper Fabianaron Lamica Mccart, PharmD, BCPS Clinical pharmacist, pager [phone] 05/21/2015 2:22 AM

## 2015-05-21 NOTE — Procedures (Signed)
Extubation Procedure Note  Patient Details:   Name: Thereasa SoloKenneth Martinezgarcia DOB: [birth year] MRN: [id number]   Airway Documentation:     Evaluation  O2 sats: stable throughout Complications: No apparent complications Patient did tolerate procedure well. Bilateral Breath Sounds: Clear   Yes   Patient extubated to 3L nasal cannula per MD order.  Positive cuff leak was noted.  No evidence of stridor.  Patient able to speak post extubation.  Sats currently 99%.  Vitals are stable.  No complications noted.  Durwin GlazeBrown, Samyukta Cura N 05/21/2015, 5:48 PM

## 2015-05-21 NOTE — Progress Notes (Signed)
PCCM Interval Progress Note  Events:  Persistent RVR (HR as high as 170's) despite multiple doses of BB while in PACU as well as additional sedation after arrival to ICU.  BP borderline with SBP ranging from high 90's to 120's.  Interventions:  Will start amiodarone bolus followed by infusion.   Rutherford Guysahul Dagan Heinz, [place] - [facility] Pager: [phone]  or [phone] 05/21/2015, 2:29 AM

## 2015-05-22 ENCOUNTER — Inpatient Hospital Stay (HOSPITAL_COMMUNITY): Payer: Medicare HMO

## 2015-05-22 ENCOUNTER — Encounter (HOSPITAL_COMMUNITY): Payer: Self-pay | Admitting: Cardiology

## 2015-05-22 ENCOUNTER — Ambulatory Visit (HOSPITAL_COMMUNITY): Payer: Medicare HMO

## 2015-05-22 DIAGNOSIS — I4891 Unspecified atrial fibrillation: Secondary | ICD-10-CM

## 2015-05-22 DIAGNOSIS — I421 Obstructive hypertrophic cardiomyopathy: Secondary | ICD-10-CM

## 2015-05-22 DIAGNOSIS — D696 Thrombocytopenia, unspecified: Secondary | ICD-10-CM

## 2015-05-22 DIAGNOSIS — I10 Essential (primary) hypertension: Secondary | ICD-10-CM

## 2015-05-22 DIAGNOSIS — Z720 Tobacco use: Secondary | ICD-10-CM

## 2015-05-22 DIAGNOSIS — E785 Hyperlipidemia, unspecified: Secondary | ICD-10-CM

## 2015-05-22 DIAGNOSIS — Z9889 Other specified postprocedural states: Secondary | ICD-10-CM

## 2015-05-22 DIAGNOSIS — D62 Acute posthemorrhagic anemia: Secondary | ICD-10-CM

## 2015-05-22 DIAGNOSIS — R4701 Aphasia: Secondary | ICD-10-CM

## 2015-05-22 DIAGNOSIS — Z9861 Coronary angioplasty status: Secondary | ICD-10-CM

## 2015-05-22 DIAGNOSIS — I2510 Atherosclerotic heart disease of native coronary artery without angina pectoris: Secondary | ICD-10-CM

## 2015-05-22 DIAGNOSIS — D72829 Elevated white blood cell count, unspecified: Secondary | ICD-10-CM

## 2015-05-22 DIAGNOSIS — R7303 Prediabetes: Secondary | ICD-10-CM | POA: Insufficient documentation

## 2015-05-22 DIAGNOSIS — I482 Chronic atrial fibrillation, unspecified: Secondary | ICD-10-CM | POA: Diagnosis present

## 2015-05-22 DIAGNOSIS — I480 Paroxysmal atrial fibrillation: Secondary | ICD-10-CM

## 2015-05-22 DIAGNOSIS — Z950 Presence of cardiac pacemaker: Secondary | ICD-10-CM

## 2015-05-22 DIAGNOSIS — Z7901 Long term (current) use of anticoagulants: Secondary | ICD-10-CM

## 2015-05-22 DIAGNOSIS — I495 Sick sinus syndrome: Secondary | ICD-10-CM | POA: Diagnosis present

## 2015-05-22 DIAGNOSIS — R0682 Tachypnea, not elsewhere classified: Secondary | ICD-10-CM | POA: Insufficient documentation

## 2015-05-22 HISTORY — DX: Presence of cardiac pacemaker: Z95.0

## 2015-05-22 HISTORY — DX: Hyperlipidemia, unspecified: E78.5

## 2015-05-22 HISTORY — DX: Unspecified atrial fibrillation: I48.91

## 2015-05-22 LAB — HEMOGLOBIN A1C
HEMOGLOBIN A1C: 5.8 % — AB (ref 4.8–5.6)
Mean Plasma Glucose: 120 mg/dL

## 2015-05-22 LAB — BASIC METABOLIC PANEL
ANION GAP: 9 (ref 5–15)
BUN: 11 mg/dL (ref 6–20)
CALCIUM: 8.5 mg/dL — AB (ref 8.9–10.3)
CHLORIDE: 109 mmol/L (ref 101–111)
CO2: 21 mmol/L — AB (ref 22–32)
Creatinine, Ser: 0.86 mg/dL (ref 0.61–1.24)
GFR calc Af Amer: >60 mL/min (ref >60)
GFR calc non Af Amer: >60 mL/min (ref >60)
GLUCOSE: 111 mg/dL — AB (ref 65–99)
Potassium: 3.7 mmol/L (ref 3.5–5.1)
Sodium: 139 mmol/L (ref 135–145)

## 2015-05-22 LAB — CBC
HEMATOCRIT: 38.1 % — AB (ref 39.0–52.0)
HEMOGLOBIN: 12 g/dL — AB (ref 13.0–17.0)
MCH: 30.2 pg (ref 26.0–34.0)
MCHC: 31.5 g/dL (ref 30.0–36.0)
MCV: 96 fL (ref 78.0–100.0)
Platelets: 108 10*3/uL — ABNORMAL LOW (ref 150–400)
RBC: 3.97 MIL/uL — ABNORMAL LOW (ref 4.22–5.81)
RDW: 14.3 % (ref 11.5–15.5)
WBC: 15 10*3/uL — ABNORMAL HIGH (ref 4.0–10.5)

## 2015-05-22 MED ORDER — ROSUVASTATIN CALCIUM 20 MG PO TABS
20.0000 mg | ORAL_TABLET | Freq: Every day | ORAL | Status: DC
  Administered 2015-05-22 – 2015-05-24 (×3): 20 mg via ORAL
  Filled 2015-05-22 (×5): qty 1

## 2015-05-22 MED ORDER — ENSURE ENLIVE PO LIQD
237.0000 mL | Freq: Two times a day (BID) | ORAL | Status: DC
  Administered 2015-05-22 – 2015-05-24 (×4): 237 mL via ORAL
  Filled 2015-05-22 (×6): qty 237

## 2015-05-22 MED ORDER — PANTOPRAZOLE SODIUM 40 MG PO TBEC
40.0000 mg | DELAYED_RELEASE_TABLET | Freq: Every day | ORAL | Status: DC
  Administered 2015-05-23 – 2015-05-24 (×2): 40 mg via ORAL
  Filled 2015-05-22 (×2): qty 1

## 2015-05-22 MED ORDER — ACETAMINOPHEN 325 MG PO TABS: 650.0000 mg | ORAL_TABLET | Freq: Four times a day (QID) | ORAL | Status: DC | PRN

## 2015-05-22 MED ORDER — METOPROLOL SUCCINATE ER 25 MG PO TB24
25.0000 mg | ORAL_TABLET | Freq: Every day | ORAL | Status: DC
  Administered 2015-05-22: 25 mg via ORAL
  Filled 2015-05-22: qty 1

## 2015-05-22 MED ORDER — POTASSIUM CHLORIDE 10 MEQ/100ML IV SOLN
10.0000 meq | INTRAVENOUS | Status: AC
  Administered 2015-05-22 (×2): 10 meq via INTRAVENOUS
  Filled 2015-05-22 (×2): qty 100

## 2015-05-22 MED ORDER — AMIODARONE HCL 200 MG PO TABS
400.0000 mg | ORAL_TABLET | Freq: Two times a day (BID) | ORAL | Status: DC
  Administered 2015-05-22 – 2015-05-24 (×4): 400 mg via ORAL
  Filled 2015-05-22 (×4): qty 2

## 2015-05-22 MED ORDER — LISINOPRIL 2.5 MG PO TABS
2.5000 mg | ORAL_TABLET | Freq: Every day | ORAL | Status: DC
  Administered 2015-05-22: 2.5 mg via ORAL
  Filled 2015-05-22: qty 1

## 2015-05-22 NOTE — Progress Notes (Signed)
Pt arrived to 84M unit at 1838 via bed. Patient alert and oriented. Assessment completed. Telemetry placed with two staff to verify. Pt has no complaints of pain. Oriented to unit and equipment. Will continue to monitor.

## 2015-05-22 NOTE — Progress Notes (Signed)
Referring Physician(s): Dr Jerel Shepherd  Supervising Physician: Julieanne Cotton  Chief Complaint:  CVA Ll MCA/ICA clot retrieval Pta/stent 3/28  Subjective:  Trying to communicate Unable to answer most questions Difficult expression Moves all 4s---not always to command   Allergies: Bee venom  Medications: Prior to Admission medications   Medication Sig Start Date End Date Taking? Authorizing Provider  apixaban (ELIQUIS) 5 MG TABS tablet Take 2.5 mg by mouth 2 (two) times daily.   Yes Historical Provider, MD  lisinopril (PRINIVIL,ZESTRIL) 2.5 MG tablet Take 2.5 mg by mouth daily.   Yes Historical Provider, MD  metoprolol (TOPROL-XL) 50 MG 24 hr tablet Take 25 mg by mouth daily.    Yes Historical Provider, MD  rosuvastatin (CRESTOR) 20 MG tablet Take 20 mg by mouth daily.     Yes Historical Provider, MD     Vital Signs: BP 120/78 mmHg  Pulse 66  Temp(Src) 99.5 F (37.5 C) (Core (Comment))  Resp 28  Ht 6' (1.829 m)  Wt 191 lb 12.8 oz (87 kg)  BMI 26.01 kg/m2  SpO2 99%  Physical Exam  Abdominal: Soft.  Musculoskeletal: Normal range of motion.  Can move all 4s Follows visual instructions Does not follow verbal commands Cannot say name Tries to communicate with words---although not appropriate  Skin: Skin is warm and dry.  Groin clean and dry NT no hematoma    Imaging: Ct Angio Head W/cm &/or Wo Cm  05/20/2015  CLINICAL DATA:  Right-sided weakness and aphasia. EXAM: CT ANGIOGRAPHY HEAD AND NECK TECHNIQUE: Multidetector CT imaging of the head and neck was performed using the standard protocol during bolus administration of intravenous contrast. Multiplanar CT image reconstructions and MIPs were obtained to evaluate the vascular anatomy. Carotid stenosis measurements (when applicable) are obtained utilizing NASCET criteria, using the distal internal carotid diameter as the denominator. CONTRAST:  50mL OMNIPAQUE IOHEXOL 350 MG/ML SOLN COMPARISON:  None. FINDINGS: CTA  NECK Aortic arch: Normal variant 4 vessel aortic arch with the left vertebral artery arising directly from the arch. Moderate aortic arch atherosclerosis. Mild non stenotic plaque involving the brachiocephalic and right subclavian arteries. Moderate calcified plaque in the proximal left subclavian artery resulting in less than 50% narrowing. Right carotid system: Moderate calcified plaque at the carotid bifurcation results in approximately 50% proximal ICA stenosis. There is also severe proximal ECA stenosis. Left carotid system: Common carotid artery is patent without significant stenosis. There is extensive calcified and noncalcified plaque at the carotid bifurcation, and the ICA is occluded at its origin without evidence of reconstitution in the neck. Vertebral arteries: The vertebral arteries are patent with the right being dominant. No significant right vertebral artery stenosis is seen. There is likely mild left vertebral artery origin stenosis. Skeleton: Mild multilevel cervical disc degeneration. Other neck: Partially visualized coronary artery atherosclerosis. Scattered venous air likely related to recent venipuncture. CTA HEAD Anterior circulation: Intracranial right ICA is patent with mild calcified plaque but no significant stenosis. The left ICA reconstitutes at the horizontal petrous segment but is irregular and poorly opacified distal to this with suspected moderate to severe focal stenosis in the anterior cavernous segment. The ICA terminus is patent, however there is occlusion of the proximal left M1 segment near its origin. Minimal collateral flow is present in distal MCA branch vessels. The right MCA is patent without evidence of significant proximal stenosis or major branch occlusion. The left A1 segment is hypoplastic. Right A1 segment is widely patent. A2 and more distal ACA branches  demonstrate mild irregularity. No intracranial aneurysm is identified. Posterior circulation: Intracranial  vertebral arteries are widely patent to the vertebrobasilar junction with the right being dominant. Right PICA origin is patent. Left PICA is not well seen. AICAs are also not well seen. SCA origins are patent. Basilar artery is patent without stenosis. There is a tiny right posterior communicating artery. PCAs are patent with mild branch vessel irregularity but no significant proximal stenosis. Venous sinuses: Not well evaluated due to arterial phase contrast timing. IMPRESSION: 1. 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 Reconstitution intracranially but with evidence of poor flow. 2. Left M1 MCA occlusion near its origin. Critical Value/emergent results were called by telephone at the time of interpretation on 05/20/2015 at 9:55 pm to Dr. Ritta Slot , who verbally acknowledged these results. Electronically Signed   By: Sebastian Ache M.D.   On: 05/20/2015 22:13   Ct Head Wo Contrast  05/21/2015  CLINICAL DATA:  75 year old hypertensive male with right-sided weakness. Post intervention yesterday. Subsequent encounter. EXAM: CT HEAD WITHOUT CONTRAST TECHNIQUE: Contiguous axial images were obtained from the base of the skull through the vertex without intravenous contrast. COMPARISON:  05/21/2015 1:25 a.m. head CT.  05/20/2015. FINDINGS: Interval partial clearing of hyperdense material within the frontal lobe which may represent clearing of injected contrast or partial clearing of intracranial hemorrhage. Evolution of what appears to be a large left middle cerebral artery distribution infarct. Mild mass effect upon the left lateral ventricle. Prominent small vessel disease changes. No intracranial mass lesion noted on this unenhanced exam. Global atrophy without hydrocephalus. Mild exophthalmos. IMPRESSION: Interval partial clearing of hyperdense material within the frontal lobe which may represent clearing of injected contrast or partial clearing of intracranial hemorrhage. Evolution of what appears to  be a large left middle cerebral artery distribution infarct. Mild mass effect upon the left lateral ventricle. Prominent small vessel disease changes. Electronically Signed   By: Lacy Duverney M.D.   On: 05/21/2015 13:13   Ct Head Wo Contrast  05/21/2015  CLINICAL DATA:  Initial evaluation status post left common carotid arteriogram. EXAM: CT HEAD WITHOUT CONTRAST TECHNIQUE: Contiguous axial images were obtained from the base of the skull through the vertex without intravenous contrast. COMPARISON:  Prior studies from 05/20/2015 FINDINGS: Age-related cerebral atrophy with chronic microvascular ischemic disease again noted. There is new patchy hyperdensity involving the anterior left operculum and insular region, likely contrast staining from recent arteriogram. Small amount of hemorrhage 9 entirely excluded. Involvement of the left lentiform nucleus present. No other acute intracranial hemorrhage. No other acute large vessel territory infarct. No extra-axial fluid collection. No hydrocephalus. Scalp soft tissues within normal limits. No acute abnormality about the orbits. Mild mucosal thickening within the ethmoidal air cells. Paranasal sinuses are otherwise clear. No mastoid effusion. Calvarium intact. IMPRESSION: 1. Scattered parenchymal hyperdensity within the anterior left MCA distribution, likely contrast staining from recent catheter directed arteriogram. Small amount of hemorrhage not excluded. Attention at follow-up recommended. 2. Otherwise stable head CT. Results were called by telephone at the time of interpretation on 05/21/2015 at 1:57 am to Dr. Amada Jupiter, who verbally acknowledged these results. Electronically Signed   By: Rise Mu M.D.   On: 05/21/2015 02:04   Ct Head Wo Contrast  05/20/2015  CLINICAL DATA:  Code stroke. Right-sided paralysis and aphasia, acute onset. Initial encounter. EXAM: CT HEAD WITHOUT CONTRAST TECHNIQUE: Contiguous axial images were obtained from the base of  the skull through the vertex without intravenous contrast. COMPARISON:  None.  FINDINGS: There is no evidence of acute infarction, mass lesion, or intra- or extra-axial hemorrhage on CT. Prominence of ventricles and sulci reflects mild to moderate cortical volume loss. Mild cerebellar atrophy is noted. Scattered periventricular and subcortical white matter change likely reflects small vessel ischemic microangiopathy. The brainstem and fourth ventricle are within normal limits. The basal ganglia are unremarkable in appearance. The cerebral hemispheres demonstrate grossly normal gray-white differentiation. No mass effect or midline shift is seen. There is no evidence of fracture; visualized osseous structures are unremarkable in appearance. The visualized portions of the orbits are within normal limits. The paranasal sinuses and mastoid air cells are well-aerated. No significant soft tissue abnormalities are seen. IMPRESSION: 1. No acute intracranial pathology seen on CT. 2. Mild to moderate cortical volume loss and scattered small vessel ischemic microangiopathy. These results were called by telephone at the time of interpretation on 05/20/2015 at 9:27 pm to Dr. Amada JupiterKirkpatrick, who verbally acknowledged these results. Electronically Signed   By: Roanna RaiderJeffery  Chang M.D.   On: 05/20/2015 21:27   Ct Angio Neck W/cm &/or Wo/cm  05/20/2015  CLINICAL DATA:  Right-sided weakness and aphasia. EXAM: CT ANGIOGRAPHY HEAD AND NECK TECHNIQUE: Multidetector CT imaging of the head and neck was performed using the standard protocol during bolus administration of intravenous contrast. Multiplanar CT image reconstructions and MIPs were obtained to evaluate the vascular anatomy. Carotid stenosis measurements (when applicable) are obtained utilizing NASCET criteria, using the distal internal carotid diameter as the denominator. CONTRAST:  50mL OMNIPAQUE IOHEXOL 350 MG/ML SOLN COMPARISON:  None. FINDINGS: CTA NECK Aortic arch: Normal variant 4  vessel aortic arch with the left vertebral artery arising directly from the arch. Moderate aortic arch atherosclerosis. Mild non stenotic plaque involving the brachiocephalic and right subclavian arteries. Moderate calcified plaque in the proximal left subclavian artery resulting in less than 50% narrowing. Right carotid system: Moderate calcified plaque at the carotid bifurcation results in approximately 50% proximal ICA stenosis. There is also severe proximal ECA stenosis. Left carotid system: Common carotid artery is patent without significant stenosis. There is extensive calcified and noncalcified plaque at the carotid bifurcation, and the ICA is occluded at its origin without evidence of reconstitution in the neck. Vertebral arteries: The vertebral arteries are patent with the right being dominant. No significant right vertebral artery stenosis is seen. There is likely mild left vertebral artery origin stenosis. Skeleton: Mild multilevel cervical disc degeneration. Other neck: Partially visualized coronary artery atherosclerosis. Scattered venous air likely related to recent venipuncture. CTA HEAD Anterior circulation: Intracranial right ICA is patent with mild calcified plaque but no significant stenosis. The left ICA reconstitutes at the horizontal petrous segment but is irregular and poorly opacified distal to this with suspected moderate to severe focal stenosis in the anterior cavernous segment. The ICA terminus is patent, however there is occlusion of the proximal left M1 segment near its origin. Minimal collateral flow is present in distal MCA branch vessels. The right MCA is patent without evidence of significant proximal stenosis or major branch occlusion. The left A1 segment is hypoplastic. Right A1 segment is widely patent. A2 and more distal ACA branches demonstrate mild irregularity. No intracranial aneurysm is identified. Posterior circulation: Intracranial vertebral arteries are widely patent to  the vertebrobasilar junction with the right being dominant. Right PICA origin is patent. Left PICA is not well seen. AICAs are also not well seen. SCA origins are patent. Basilar artery is patent without stenosis. There is a tiny right posterior communicating artery. PCAs are  patent with mild branch vessel irregularity but no significant proximal stenosis. Venous sinuses: Not well evaluated due to arterial phase contrast timing. IMPRESSION: 1. 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 Reconstitution intracranially but with evidence of poor flow. 2. Left M1 MCA occlusion near its origin. Critical Value/emergent results were called by telephone at the time of interpretation on 05/20/2015 at 9:55 pm to Dr. Ritta Slot , who verbally acknowledged these results. Electronically Signed   By: Sebastian Ache M.D.   On: 05/20/2015 22:13   Portable Chest Xray  05/22/2015  CLINICAL DATA:  Respiratory failure, hypoxia EXAM: PORTABLE CHEST 1 VIEW COMPARISON:  Portable chest x-ray of May 21, 2015 FINDINGS: There has been interval extubation of the trachea and of the esophagus. The lungs are adequately inflated. The interstitial markings are mildly prominent though stable. The retrocardiac region on the left remains dense. The cardiac silhouette remains enlarged. The central pulmonary vascularity remains mildly engorged. The permanent pacemaker is in stable position. The bony thorax exhibits no acute abnormality. IMPRESSION: Interval extubation of the trachea and esophagus. Low-grade CHF superimposed on underlying COPD. Persistent left lower lobe atelectasis or pneumonia. Electronically Signed   By: David  [place] M.D.   On: 05/22/2015 07:19   Dg Chest Port 1 View  05/21/2015  CLINICAL DATA:  Endotracheal tube placement.  Initial encounter. EXAM: PORTABLE CHEST 1 VIEW COMPARISON:  None. FINDINGS: The patient's endotracheal tube is seen ending 4-5 cm above the carina. An enteric tube is noted extending below diaphragm. The lungs  are well-aerated. Vascular congestion is noted, with minimal bilateral atelectasis. There is no evidence of pleural effusion or pneumothorax. The cardiomediastinal silhouette is borderline normal in size. A pacemaker is noted overlying the left chest wall, with leads ending overlying the right atrium and right ventricle. No acute osseous abnormalities are seen. IMPRESSION: 1. Endotracheal tube seen ending 4-5 cm above the carina. 2. Vascular congestion, with minimal bilateral atelectasis. Electronically Signed   By: Roanna Raider M.D.   On: 05/21/2015 02:29   Dg Abd Portable 1v  05/21/2015  CLINICAL DATA:  Orogastric tube placement.  Initial encounter. EXAM: PORTABLE ABDOMEN - 1 VIEW COMPARISON:  None. FINDINGS: The patient's enteric tube is noted ending overlying the body of the stomach. Its side-port is noted about the fundus of the stomach. The visualized bowel gas pattern is grossly unremarkable. Contrast is noted within the right renal collecting system. No free intra-abdominal air is seen, though evaluation for free air is limited on a single supine view. No acute osseous abnormalities are identified. Pacemaker leads are partially imaged, with a left chest wall pacemaker seen. The visualized portions of the lungs are grossly clear. IMPRESSION: Enteric tube noted ending overlying the body of the stomach. Electronically Signed   By: Roanna Raider M.D.   On: 05/21/2015 02:30    Labs:  CBC:  Recent Labs  05/20/15 2110 05/20/15 2115 05/21/15 0523 05/22/15 0214  WBC  --  12.2* 13.0* 15.0*  HGB 16.3 14.9 12.2* 12.0*  HCT 48.0 45.7 37.9* 38.1*  PLT  --  140* 115* 108*    COAGS:  Recent Labs  05/20/15 2115  INR 1.21  APTT 31    BMP:  Recent Labs  05/20/15 2110 05/20/15 2115 05/21/15 0523 05/22/15 0214  NA 137 138 137 139  K 3.8 4.1 3.8 3.7  CL 106 106 109 109  CO2  --  22 22 21*  GLUCOSE 161* 165* 138* 111*  BUN 24* 20 15 11  CALCIUM  --  9.2 8.1* 8.5*  CREATININE 1.00  1.07 0.88 0.86  GFRNONAA  --  >60 >60 >60  GFRAA  --  >60 >60 >60    LIVER FUNCTION TESTS:  Recent Labs  05/20/15 2115  BILITOT 0.6  AST 32  ALT 22  ALKPHOS 49  PROT 5.9*  ALBUMIN 3.6    Assessment and Plan:  CVA L ICA pta/stent 3/28 in IR Plan per Stroke team Will follow Has been evaluated with Rehab and deemed appropriate  Electronically Signed: Brand Siever A 05/22/2015, 3:35 PM   I spent a total of 15 Minutes at the the patient's bedside AND on the patient's hospital floor or unit, greater than 50% of which was counseling/coordinating care for cva; LICA pta/stent

## 2015-05-22 NOTE — Progress Notes (Signed)
[facility] FOR AM LAB REPLACEMENT ONLY  The patient does [facility] Protocol based on the criteria listed below:   1. Is GFR >/= 40 ml/min? Yes.    Patient's GFR today is >60 2. Is urine output >/= 0.5 ml/kg/hr for the last 6 hours? Yes.   Patient's UOP is 0.53 ml/kg/hr 3. Is BUN < 60 mg/dL? Yes.    Patient's BUN today is 11 4. Abnormal electrolyte  K 3.7 5. Ordered repletion with: per protocol 6. If a panic level lab has been reported, has the CCM MD in charge been notified? Yes.  .   Physician:  Tonny BranchSommer  Oaklen Thiam, Lanora ManisElizabeth McEachran 05/22/2015 6:18 AM

## 2015-05-22 NOTE — Progress Notes (Signed)
STROKE TEAM PROGRESS NOTE   SUBJECTIVE (INTERVAL HISTORY) His RN is at the bedside. Overall his condition is improving. He is extubated and sheath off. He is able to move right arm and leg strongly. Able to follow most of the commands now and able to have simple words out but not sentences yet. Need to repeat CT head. On ASA and plavix.   OBJECTIVE Temp:  [98.4 F (36.9 C)-100.4 F (38 C)] 100.2 F (37.9 C) (03/29 1700) Pulse Rate:  [56-81] 81 (03/29 1808) Cardiac Rhythm:  [-] Sinus bradycardia (03/29 0800) Resp:  [18-30] 30 (03/29 1700) BP: (105-135)/(61-98) 114/84 mmHg (03/29 1808) SpO2:  [93 %-100 %] 98 % (03/29 1700)  CBC:   Recent Labs Lab 05/20/15 2115 05/21/15 0523 05/22/15 0214  WBC 12.2* 13.0* 15.0*  NEUTROABS 9.9* 11.2*  --   HGB 14.9 12.2* 12.0*  HCT 45.7 37.9* 38.1*  MCV 94.4 94.8 96.0  PLT 140* 115* 108*    Basic Metabolic Panel:   Recent Labs Lab 05/21/15 0523 05/22/15 0214  NA 137 139  K 3.8 3.7  CL 109 109  CO2 22 21*  GLUCOSE 138* 111*  BUN 15 11  CREATININE 0.88 0.86  CALCIUM 8.1* 8.5*    Lipid Panel:     Component Value Date/Time   CHOL 84 05/21/2015 0522   TRIG 59 05/21/2015 0522   HDL 29* 05/21/2015 0522   CHOLHDL 2.9 05/21/2015 0522   VLDL 12 05/21/2015 0522   LDLCALC 43 05/21/2015 0522   HgbA1c:  Lab Results  Component Value Date   HGBA1C 5.8* 05/21/2015   Urine Drug Screen:     Component Value Date/Time   LABOPIA NONE DETECTED 05/20/2015 2142   COCAINSCRNUR NONE DETECTED 05/20/2015 2142   LABBENZ NONE DETECTED 05/20/2015 2142   AMPHETMU NONE DETECTED 05/20/2015 2142   THCU NONE DETECTED 05/20/2015 2142   LABBARB NONE DETECTED 05/20/2015 2142      IMAGING I have personally reviewed the radiological images below and agree with the radiology interpretations.  Ct Angio Head and neck W/cm &/or Wo Cm  05/20/2015  IMPRESSION: 1. 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 Reconstitution intracranially but with evidence of poor  flow. 2. Left M1 MCA occlusion near its origin.   Ct Head Wo Contrast  05/21/2015  IMPRESSION: 1. Scattered parenchymal hyperdensity within the anterior left MCA distribution, likely contrast staining from recent catheter directed arteriogram. Small amount of hemorrhage not excluded. Attention at follow-up recommended. 2. Otherwise stable head CT.  05/20/2015  IMPRESSION: 1. No acute intracranial pathology seen on CT. 2. Mild to moderate cortical volume loss and scattered small vessel ischemic microangiopathy.   Cerebral angio -   S/P Lt common carotisd arteriogram,followed by complete revascularization of occluded LT MCA M1 seg with x1 pass with the Solitaire FR30mm x 40 mm retrieval device and a total of 2 mg of superselective IA Integrelin and complete revascularization of symptomatic LT ICA occlusion with stent assisted angioplasty and 6 mg of IA Integrelin  Repeat CT head - pending  2D echo - pending   Physical exam  Temp:  [98.4 F (36.9 C)-100.4 F (38 C)] 100.2 F (37.9 C) (03/29 1700) Pulse Rate:  [56-81] 81 (03/29 1808) Resp:  [18-30] 30 (03/29 1700) BP: (105-135)/(61-98) 114/84 mmHg (03/29 1808) SpO2:  [93 %-100 %] 98 % (03/29 1700)  General - Well nourished, well developed, no acute distress.  Ophthalmologic - Fundi not visualized due to noncooperation.  Cardiovascular - NSR   Neuro -  awake alert and followed central and peripheral commands, able to have simple words out but dysarthric, able to repeat simple sentences, no gaze preference anymore, tongue in middle, moving both UEs and LEs grossly equal 4/5 at least. Left dorsiflexion 4/5, right dorsiflexion 2/5. DTR 1+ and no babinski.   ASSESSMENT/PLAN [name] is a 75 y.o. male with history of atrial fibrillation on anticoagulation presenting with right-sided weakness. He did not receive IV t-PA due to being on anticoagulation. He received complete revascularization of L M1 with mechanical thrombectomy and  IA Integrilin; and complete revascularization of left ICA with stent-assisted angioplasty and IA Integrilin.  Stroke:  Dominant left MCA infarct secondary to left ICA/MCA occlusion, embolic secondary to known atrial fibrillation. s/p complete revascularization with mechanical thrombectomy, stent-assisted angioplasty and IA Integrilin  Resultant  Aphasia, improving  MRI / MRA - pacemaker  CTA head and neck - left ICA and MCA occlusion  CT head repeat pending  2D Echo  pending  LDL 43  HgbA1c 5.8  SCDs for VTE prophylaxis DIET DYS 2 Room service appropriate?: Yes; Fluid consistency:: Thin  Eliquis (apixaban) daily prior to admission, now on aspirin 81 mg daily and clopidogrel 75 mg daily. Will need to repeat CT head to consider the timing off resuming eliquis.  Patient counseled to be compliant with his antithrombotic medications  Ongoing aggressive stroke risk factor management  Therapy recommendations:  pending  Disposition:  pending  Paroxysmal atrial Fibrillation with RVR  Home anticoagulation:  Eliquis (apixaban) daily continued in the hospital  RVR, uncontrolled. Started on amiodarone after failed beta blockers  Transition from IV to po amiodarone  Cardiology consult  afib RVR switching to NSR during round  Resume home meds after passing swallow  Left ICA occlusion  S/p mechanical thrombectomy  S/p left ICA stent  On DAPT  Once resume eliquis, will continue plavix and d/c ASA   BP management  BP stable  BP goal 120-140  Resume home meds  Hyperlipidemia  Home meds:  Crestor 20 mg daily  LDL 43, goal < 70  Resume Crestor   UTI  UA WBC TNTC  On rocephin  Urine culture pending  Other Stroke Risk Factors  Advanced age  Cigarette smoker, advised to stop smoking  Hypertrophic cardiomyopathy  Other Active Problems  S/p pacer  Hospital day # 2  This patient is critically ill due to left ICA and MCA occlusion, left MCA infarct,  afib RVR uncontrolled, respiratogy failure and at significant risk of neurological worsening, death form recurrent infarcts, hemorrhagic transformation, heart failure, cerebral edema. This patient's care requires constant monitoring of vital signs, hemodynamics, respiratory and cardiac monitoring, review of multiple databases, neurological assessment, discussion with family, other specialists and medical decision making of high complexity. I spent 35 minutes of neurocritical care time in the care of this patient.  Marvel PlanJindong Cashae Weich, MD PhD Stroke Neurology 05/22/2015 6:26 PM    To contact Stroke Continuity provider, please refer to [email or web address]. After hours, contact General Neurology

## 2015-05-22 NOTE — Clinical Documentation Improvement (Signed)
Neurology Critical Care  Can the diagnosis of Atrial Fibrillation be further specified? Please document findings in next progress note. Thank you.   Chronic Atrial fibrillation  Paroxysmal Atrial fibrillation  Permanent Atrial fibrillation  Persistent Atrial fibrillation  Other  Clinically Undetermined  Document any associated diagnoses/conditions.  Supporting Information:  Patient taking Eliquis  Recent cardiac ablation  Please exercise your independent, professional judgment when responding. A specific answer is not anticipated or expected.  Thank You,  Shellee MiloEileen T Tory Septer RN, BSN, CCDS Health Information Management [place] [phone]; Cell: [phone]

## 2015-05-22 NOTE — Evaluation (Signed)
Physical Therapy Evaluation Patient Details Name: Luis Robinson MRN: [id number] DOB: 12-May-1940 Today's Date: 05/22/2015   History of Present Illness  pt presents with L MCA Infarct with L MCA and ICA REvascularization and L ICA Stent placed.  pt with hx of HTN, Cardiomyopathy, Back Surgery, Rotator Cuff Surgery, and Pacemaker inserted, replaced, and then removed.    Clinical Impression  Pt with expressive deficits and yes/no not reliable.  Pt does follow one-step directions consistently, but does need cues for attend to R side and for attention to task.  Feel pt would make a great rehab candidate in order to maximize independence prior to returning to home with family.  Will continue to follow while on acute.      Follow Up Recommendations CIR    Equipment Recommendations  None recommended by PT    Recommendations for Other Services Rehab consult     Precautions / Restrictions Precautions Precautions: Fall Restrictions Weight Bearing Restrictions: No      Mobility  Bed Mobility Overal bed mobility: Needs Assistance Bed Mobility: Supine to Sit     Supine to sit: Min guard;HOB elevated     General bed mobility comments: pt did have HOB elevated after SLP performed bedside swallow, but with increased time and effort was able to come to sitting with close guarding and A for lines and covers.    Transfers Overall transfer level: Needs assistance Equipment used: Rolling walker (2 wheeled) Transfers: Sit to/from Stand Sit to Stand: Min assist         General transfer comment: Cues for UE use and A more for balance than power up to standing.    Ambulation/Gait Ambulation/Gait assistance: Min assist Ambulation Distance (Feet): 100 Feet Assistive device: Rolling walker (2 wheeled) Gait Pattern/deviations: Step-through pattern;Decreased step length - right;Decreased stance time - right;Decreased stride length;Decreased dorsiflexion - right;Trunk flexed     General Gait  Details: Multiple cues for more upright posture and positioning within RW.  pt tends to drift to R side and with max cueing pt is able to attend to obstacles on R side, but not without cues.  Foot drop noted on R side and mildly antalgic gait. but family indicates that antalgic gait is normal for him, but foot drop is not.    Stairs            Wheelchair Mobility    Modified Rankin (Stroke Patients Only) Modified Rankin (Stroke Patients Only) Pre-Morbid Rankin Score: No significant disability Modified Rankin: Moderately severe disability     Balance Overall balance assessment: Needs assistance Sitting-balance support: No upper extremity supported;Feet supported Sitting balance-Leahy Scale: Fair     Standing balance support: Bilateral upper extremity supported;Single extremity supported;During functional activity Standing balance-Leahy Scale: Poor Standing balance comment: Needs UE support to maintain balance.                             Pertinent Vitals/Pain Pain Assessment: No/denies pain    Home Living Family/patient expects to be discharged to:: Inpatient rehab                      Prior Function Level of Independence: Independent               Hand Dominance   Dominant Hand: Right    Extremity/Trunk Assessment   Upper Extremity Assessment: Defer to OT evaluation           Lower Extremity  Assessment: RLE deficits/detail RLE Deficits / Details: Generally weak ~4/5.  Difficult to assess sensation as pt perseverative and with expressive deficits.  Pain sensation appears intact.  Decreased coordination and ataxic movements.      Cervical / Trunk Assessment: Kyphotic  Communication   Communication: Expressive difficulties  Cognition Arousal/Alertness: Awake/alert Behavior During Therapy: WFL for tasks assessed/performed Overall Cognitive Status: Difficult to assess                      General Comments      Exercises         Assessment/Plan    PT Assessment Patient needs continued PT services  PT Diagnosis Difficulty walking;Hemiplegia dominant side   PT Problem List Decreased strength;Decreased activity tolerance;Decreased balance;Decreased mobility;Decreased coordination;Decreased cognition;Decreased knowledge of use of DME;Decreased safety awareness;Impaired sensation  PT Treatment Interventions DME instruction;Gait training;Stair training;Functional mobility training;Therapeutic activities;Therapeutic exercise;Balance training;Neuromuscular re-education;Cognitive remediation;Patient/family education   PT Goals (Current goals can be found in the Care Plan section) Acute Rehab PT Goals Patient Stated Goal: Per family for pt to be able to come home. PT Goal Formulation: With patient/family Time For Goal Achievement: 06/05/15 Potential to Achieve Goals: Good    Frequency Min 4X/week   Barriers to discharge        Co-evaluation               End of Session Equipment Utilized During Treatment: Gait belt Activity Tolerance: Patient tolerated treatment well Patient left: in chair;with call bell/phone within reach;with chair alarm set;with family/visitor present Nurse Communication: Mobility status         Time: 1191-4782 PT Time Calculation (min) (ACUTE ONLY): 31 min   Charges:   PT Evaluation $PT Eval Moderate Complexity: 1 Procedure PT Treatments $Gait Training: 8-22 mins   PT G CodesSunny Robinson, Luis Robinson [phone] 05/22/2015, 11:18 AM

## 2015-05-22 NOTE — Anesthesia Postprocedure Evaluation (Signed)
Anesthesia Post Note  Patient: Luis Robinson  Procedure(Robinson) Performed: Procedure(Robinson) (LRB): RADIOLOGY WITH ANESTHESIA (N/A)  Patient location during evaluation: ICU Anesthesia Type: General Level of consciousness: sedated and patient remains intubated per anesthesia plan Pain management: pain level controlled Vital Signs Assessment: post-procedure vital signs reviewed and stable Respiratory status: patient remains intubated per anesthesia plan Cardiovascular status: stable Anesthetic complications: no    Last Vitals:  Filed Vitals:   05/22/15 1400 05/22/15 1500  BP: 120/78 107/67  Pulse: 66 63  Temp: 37.5 C 37.5 C  Resp: 28 22    Last Pain:  Filed Vitals:   05/22/15 1540  PainSc: 0-No pain                 Luis Robinson

## 2015-05-22 NOTE — Evaluation (Signed)
Speech Language Pathology Evaluation Patient Details Name: Luis Robinson MRN: [id number] DOB: 1940-04-29 Today's Date: 05/22/2015 Time: 7829-5621 SLP Time Calculation (min) (ACUTE ONLY): 22 min  Problem List:  Patient Active Problem List   Diagnosis Date Noted  . Cerebrovascular accident (CVA) due to occlusion of cerebral artery (HCC)   . History of ETT   . Respiratory failure with hypoxia (HCC)   . Stroke (HCC)   . Stroke (cerebrum) (HCC) 05/20/2015   Past Medical History:  Past Medical History  Diagnosis Date  . Hypertrophic cardiomyopathy (HCC)   . Hyperlipidemia   . Hypertension   . Tobacco use disorder   . Dyspnea   . Dizziness    Past Surgical History:  Past Surgical History  Procedure Laterality Date  . Back surgery x 2    . Rotator cuff surgery    . Insert / replace / remove pacemaker  2008  . Radiology with anesthesia N/A 05/20/2015    Procedure: RADIOLOGY WITH ANESTHESIA;  Surgeon: Medication Radiologist, MD;  Location: [facility] OR;  Service: Radiology;  Laterality: N/A;   HPI:  Luis Robinson is a 75 y.o. male with PMH as outlined below. He was brought to [facility] ED 03/27 due to sudden onset right sided weakness and aphasia. Symptoms began suddenly that evening and son initially thought pt was having difficult time swallowing; however, turns out he wasn't able to speak. Son was with him but had left the room and upon leaving, thought he heard something fall. When he returned, he noticed the weakness and aphasia. he had CTA of teh brain which revealed acute left MCA infarct (cannot have MRI). In IR, he had complete revascularization of occluded left MCA and left ICA as well as stent placement to left ICA. Intubated from 3/27 to 3/28.    Assessment / Plan / Recommendation Clinical Impression  Pt demonstrates a primary expressive aphasia with relatively good repetition and automatic speech, but also with severe paraphasias with conversational attempts, reading and naming. Pt  is also significantly perseverative impeding ability to fully assess comprehension with Y/N questions and commands. With verbal and visual cues he is able to terminate perseverations and correct paraphasias to give more accurate responses. He is not able to demonstrate comprehension of written language with max cues. Recommend CIR at d/c, SLP will follow acutely for functional communication    SLP Assessment  Patient needs continued Speech Lanaguage Pathology Services    Follow Up Recommendations  Inpatient Rehab          SLP Evaluation Prior Functioning  Cognitive/Linguistic Baseline: Within functional limits   Cognition  Overall Cognitive Status: Difficult to assess Arousal/Alertness: Awake/alert Orientation Level: Oriented to person;Oriented to place Attention: Focused;Sustained Focused Attention: Appears intact Sustained Attention: Appears intact Behaviors: Perseveration    Comprehension  Auditory Comprehension Overall Auditory Comprehension: Impaired Yes/No Questions: Impaired Basic Biographical Questions: 51-75% accurate Commands: Impaired One Step Basic Commands: 75-100% accurate Two Step Basic Commands: 0-24% accurate Conversation: Simple Reading Comprehension Reading Status: Impaired Word level: Impaired Sentence Level: Impaired    Expression Verbal Expression Overall Verbal Expression: Impaired Initiation: No impairment Automatic Speech: Name;Social Response;Counting;Day of week;Month of year Level of Generative/Spontaneous Verbalization: Word;Phrase Repetition: No impairment (mild dysarthria) Naming: Impairment Responsive: 51-75% accurate Confrontation: Impaired Convergent: Not tested Divergent: Not tested Verbal Errors: Phonemic paraphasias;Neologisms;Perseveration;Not aware of errors Pragmatics: No impairment Effective Techniques: Phonemic cues;Sentence completion Written Expression Dominant Hand: Right Written Expression: Not tested   Oral / Motor   Oral Motor/Sensory Function Overall Oral Motor/Sensory  Function: Within functional limits (seems to have some sensory deficit or ataxia on the right) Motor Speech Overall Motor Speech: Impaired Respiration: Within functional limits Phonation: Hoarse Resonance: Within functional limits Articulation: Impaired Level of Impairment: Word Intelligibility: Intelligible Motor Planning: Witnin functional limits   GO                   [employer] CorporationBonnie Enza Shone, MA CCC-SLP [phone]  Claudine MoutonDeBlois, Glennis Montenegro Caroline 05/22/2015, 11:20 AM

## 2015-05-22 NOTE — Consult Note (Signed)
Reason for Consult:   Atrial fibrilation  Requesting Physician: Dr Erlinda Hong Primary Cardiologist Dr Bettina Gavia Harmon Dun)  HPI:   75 y/o male seen by Dr Rocky Crafts in 2012 but followed primarily by Dr Bettina Gavia and at [facility]. The pt has a history of HOCM. He has not been very symptomatic and has done well on medical Rx. He had a MDT pacemaker placed in 2008 for "SSS". In Aug 2016 he had a NSTEMI. Cath revealed Dx1 disease and he received a DES. He no other significant CAD. It appears he was on Coumadin and Plavix till he underwent RFA by Dr Minna Merritts at [facility]. After this he was on Eliquis alone as well as a statin and beta blocker.            He was admitted 05/20/15 with an acute Lt brain CVA. He underwent urgent LMCA PTA and stenting by Dr Estanislado Pandy with good angiographic result. The pt has a residual aphasia. The early hospital course was complicated by respiratory failure requiring intubation, and atrial flutter with RVR. He was treated with beta blocker for rate but his B/P was low and he was switched to IV Amiodarone. Since then he has been stabilized, extubated, and in NSR (as of 05/21/15-9 am). We are asked now to suggest further Amiodarone dosing.   PMHx:  Past Medical History  Diagnosis Date  . Hypertrophic cardiomyopathy ([place])   . Hyperlipidemia   . Hypertension   . Tobacco use disorder   . Dyspnea   . Dizziness   . PAF (paroxysmal atrial fibrillation) ([place])   . SSS (sick sinus syndrome) ([place])   . CAD S/P percutaneous coronary angioplasty Aug 2016    Dx1 DES, no other significant dis  . Chronic anticoagulation     Eliquis    Past Surgical History  Procedure Laterality Date  . Back surgery x 2    . Rotator cuff surgery    . Insert / replace / remove pacemaker  2008    MDT  . Radiology with anesthesia N/A 05/20/2015    Procedure: RADIOLOGY WITH ANESTHESIA;  Surgeon: Medication Radiologist, MD;  Location: [place];  Service: Radiology;  Laterality: N/A;  . Ablation  Nov 2016    [facility]     SOCHx:  reports that he has been smoking Cigarettes.  He has been smoking about 0.50 packs per day. He has never used smokeless tobacco. He reports that he does not drink alcohol or use illicit drugs.  FAMHx: Family History  Problem Relation Age of Onset  . Cancer Sister 22    ALLERGIES: Allergies  Allergen Reactions  . Bee Venom Anaphylaxis    ROS: Review of Systems: Unobtainable- pt aphasic   HOME MEDICATIONS: Prior to Admission medications   Medication Sig Start Date End Date Taking? Authorizing Provider  apixaban (ELIQUIS) 5 MG TABS tablet Take 2.5 mg by mouth 2 (two) times daily.   Yes Historical Provider, MD  lisinopril (PRINIVIL,ZESTRIL) 2.5 MG tablet Take 2.5 mg by mouth daily.   Yes Historical Provider, MD  metoprolol (TOPROL-XL) 50 MG 24 hr tablet Take 25 mg by mouth daily.    Yes Historical Provider, MD  rosuvastatin (CRESTOR) 20 MG tablet Take 20 mg by mouth daily.     Yes Historical Provider, MD    HOSPITAL MEDICATIONS: I have reviewed the patient's current medications.  VITALS: Blood pressure 107/67, pulse 63, temperature 99.5 F (37.5 C), temperature source Core (Comment), resp. rate 22, height 6' (  1.829 m), weight 191 lb 12.8 oz (87 kg), SpO2 99 %.  PHYSICAL EXAM: General appearance: alert, cooperative, no distress and mildly obese Neck: no carotid bruit and no JVD Lungs: decreased overall Heart: regular rate and rhythm Abdomen: soft, non-tender; bowel sounds normal; no masses,  no organomegaly Extremities: extremities normal, atraumatic, no cyanosis or edema Pulses: 2+ and symmetric Skin: Skin color, texture, turgor normal. No rashes or lesions Neurologic: Grossly normal aphasic  LABS: Results for orders placed or performed during the hospital encounter of 05/20/15 (from the past 24 hour(s))  I-STAT 3, arterial blood gas (G3+)     Status: Abnormal   Collection Time: 05/21/15  5:34 PM  Result Value Ref Range   pH, Arterial 7.335 (L) 7.350 -  7.450   pCO2 arterial 40.9 35.0 - 45.0 mmHg   pO2, Arterial 129.0 (H) 80.0 - 100.0 mmHg   Bicarbonate 21.6 20.0 - 24.0 mEq/L   TCO2 23 0 - 100 mmol/L   O2 Saturation 99.0 %   Acid-base deficit 4.0 (H) 0.0 - 2.0 mmol/L   Patient temperature 37.7 C    Collection site RADIAL, ALLEN'S TEST ACCEPTABLE    Drawn by RT    Sample type ARTERIAL   Basic metabolic panel     Status: Abnormal   Collection Time: 05/22/15  2:14 AM  Result Value Ref Range   Sodium 139 135 - 145 mmol/L   Potassium 3.7 3.5 - 5.1 mmol/L   Chloride 109 101 - 111 mmol/L   CO2 21 (L) 22 - 32 mmol/L   Glucose, Bld 111 (H) 65 - 99 mg/dL   BUN 11 6 - 20 mg/dL   Creatinine, Ser 0.86 0.61 - 1.24 mg/dL   Calcium 8.5 (L) 8.9 - 10.3 mg/dL   GFR calc non Af Amer >60 >60 mL/min   GFR calc Af Amer >60 >60 mL/min   Anion gap 9 5 - 15  CBC     Status: Abnormal   Collection Time: 05/22/15  2:14 AM  Result Value Ref Range   WBC 15.0 (H) 4.0 - 10.5 K/uL   RBC 3.97 (L) 4.22 - 5.81 MIL/uL   Hemoglobin 12.0 (L) 13.0 - 17.0 g/dL   HCT 38.1 (L) 39.0 - 52.0 %   MCV 96.0 78.0 - 100.0 fL   MCH 30.2 26.0 - 34.0 pg   MCHC 31.5 30.0 - 36.0 g/dL   RDW 14.3 11.5 - 15.5 %   Platelets 108 (L) 150 - 400 K/uL    EKG: 05/20/15- A flutter with 2:1 conduction  IMAGING: Echo pending  Scheduled Meds: . amiodarone  400 mg Oral BID  . antiseptic oral rinse  7 mL Mouth Rinse QID  . aspirin  325 mg Oral Q breakfast  . cefTRIAXone (ROCEPHIN)  IV  1 g Intravenous Q0600  . chlorhexidine gluconate (SAGE KIT)  15 mL Mouth Rinse BID  . clopidogrel  300 mg Oral Once  . clopidogrel  75 mg Oral Q breakfast  . feeding supplement (ENSURE ENLIVE)  237 mL Oral BID BM  . lisinopril  2.5 mg Oral Daily  . metoprolol succinate  25 mg Oral Daily  . pantoprazole (PROTONIX) IV  40 mg Intravenous Daily  . rosuvastatin  20 mg Oral Daily   Continuous Infusions: . fentaNYL infusion INTRAVENOUS Stopped (05/21/15 1318)  . phenylephrine (NEO-SYNEPHRINE) Adult  infusion Stopped (05/21/15 1800)  . propofol (DIPRIVAN) infusion     PRN Meds:.acetaminophen **OR** [DISCONTINUED] acetaminophen, fentaNYL, midazolam, midazolam, ondansetron (ZOFRAN) IV  IMPRESSION: Principal Problem:   CVA due to occlusion of cerebral artery- treated with LMCA stent 05/20/15 Active Problems:   Respiratory failure with hypoxia (HCC)   Atrial fibrillation with RVR (HCC)   Paroxysmal atrial fibrillation (HCC)   Hypertrophic obstructive cardiomyopathy (HCC)   Chronic anticoagulation-Eliquis prior to adm   CAD- S/P Dx1 DES Aug 2016   Tobacco abuse   Benign essential HTN   Thrombocytopenia (HCC)   SSS (sick sinus syndrome) ([place])   Cardiac pacemaker in situ-MDT- implanted 2008   Dyslipidemia   RECOMMENDATION: Amiodarone changed to po- 400 mg BID by Dr Erlinda Hong. Plavix added. Its concerning that the pt had an embolic CVA on Eliquis- the pt and son confirm he has been compliant. ? Heme consult. Check 12 lead in NSR, document TSH.   Time Spent Directly with Patient: 50 minutes  Kerin Ransom, [place] beeper 05/22/2015, 4:53 PM   Personally seen and examined. Agree with above.  75 year old male with paroxysmal atrial fibrillation, recurrent stroke, hypertrophic cardiomyopathy, pacemaker for sick sinus syndrome, drug eluting stent 09/2014 in the setting of non-STEMI admitted with acute left brain CVA, urgent stenting by Dr. Estanislado Pandy.   Paroxysmal atrial fibrillation -Agree with amiodarone use and we will change to 400 mg by mouth twice a day for continued load. After 5 days, decreased to 200 mg twice a day and then after 2 weeks decrease to 200 mg once a day. We will try to maintain sinus rhythm especially given his prior history of hypertrophic cardiomyopathy. -Continue to watch thyroid, liver functions, lung function as outpatient.  Chronic anticoagulation -Timing of reinitiation of Eliquis per neurology -I would restart Eliquis when able, 5 mg twice a day. -His  previous dose of Eliquis was 2.5 mg twice a day. He is not greater than 35 years old, creatinine is less than 1.5, weight is greater than 60 kg.   Coronary artery disease -DES diagonal August 2016, stable.  Pacemaker -2008-stable.  Hypertrophic cardiomyopathy -Awaiting echocardiogram  Acute stroke -As above. Some trouble with word finding. -When resuming Eliquis, I would discontinue aspirin since he is going to be on clopidogrel. Be careful with triple therapy especially with novel oral anticoagulant. -Question for neurology/Dr. Estanislado Pandy - how long does he need to be on Plavix? Could he possibly be on monotherapy soon with Eliquis only?  Candee Furbish, MD

## 2015-05-22 NOTE — Consult Note (Signed)
Physical Medicine and Rehabilitation Consult  Reason for Consult: Right sided weakness  Referring Physician: Dr. Roda Shutters   HPI: Luis Robinson is a 75 y.o. male with history of HCM, ablation,  HTN, back surgery with antalgic gait who was admitted on 05/20/15 after fall with acute onset of right sided weakness with inability to speak. He was found ot have A fib with RVR and IV tPA not given as patient had taken his dose of eliquis already. He underwent cerebral angio with complete revascularization of occluded L-MCA with single pass Solitaire and IA tPA and complete revascularization of L-ICA occlusion with IV tPA /stent assisted angioplasty.  He tolerated extubation post procedure and has had improvement in right sided weakness. Patient with resultant right inattention, right foot drop, kyphosis, severe paraphrasia's and perseveration affecting cognition/comprehension. CIR recommended for follow up therapy.    Review of Systems  Unable to perform ROS: medical condition    Past Medical History  Diagnosis Date  . Hypertrophic cardiomyopathy (HCC)   . Hyperlipidemia   . Hypertension   . Tobacco use disorder   . Dyspnea   . Dizziness     Past Surgical History  Procedure Laterality Date  . Back surgery x 2    . Rotator cuff surgery    . Insert / replace / remove pacemaker  2008  . Radiology with anesthesia N/A 05/20/2015    Procedure: RADIOLOGY WITH ANESTHESIA;  Surgeon: Medication Radiologist, MD;  Location: [facility] OR;  Service: Radiology;  Laterality: N/A;    Family History  Problem Relation Age of Onset  . Cancer Sister 35    Social History: Per reports that he has been smoking Cigarettes.  Per reports has been smoking about 0.50 packs per day. He has never used smokeless tobacco. Per reports that he does not drink alcohol or use illicit drugs.    Allergies  Allergen Reactions  . Bee Venom Anaphylaxis    Medications Prior to Admission  Medication Sig Dispense Refill  .  apixaban (ELIQUIS) 5 MG TABS tablet Take 2.5 mg by mouth 2 (two) times daily.    Marland Kitchen lisinopril (PRINIVIL,ZESTRIL) 2.5 MG tablet Take 2.5 mg by mouth daily.    . metoprolol (TOPROL-XL) 50 MG 24 hr tablet Take 25 mg by mouth daily.     . rosuvastatin (CRESTOR) 20 MG tablet Take 20 mg by mouth daily.        Home: Home Living Family/patient expects to be discharged to:: Inpatient rehab  Functional History: Prior Function Level of Independence: Independent Functional Status:  Mobility: Bed Mobility Overal bed mobility: Needs Assistance Bed Mobility: Supine to Sit Supine to sit: Min guard, HOB elevated General bed mobility comments: pt did have HOB elevated after SLP performed bedside swallow, but with increased time and effort was able to come to sitting with close guarding and A for lines and covers.   Transfers Overall transfer level: Needs assistance Equipment used: Rolling walker (2 wheeled) Transfers: Sit to/from Stand Sit to Stand: Min assist General transfer comment: Cues for UE use and A more for balance than power up to standing.   Ambulation/Gait Ambulation/Gait assistance: Min assist Ambulation Distance (Feet): 100 Feet Assistive device: Rolling walker (2 wheeled) Gait Pattern/deviations: Step-through pattern, Decreased step length - right, Decreased stance time - right, Decreased stride length, Decreased dorsiflexion - right, Trunk flexed General Gait Details: Multiple cues for more upright posture and positioning within RW.  pt tends to drift to R side and with max  cueing pt is able to attend to obstacles on R side, but not without cues.  Foot drop noted on R side and mildly antalgic gait. but family indicates that antalgic gait is normal for him, but foot drop is not.      ADL:    Cognition: Cognition Overall Cognitive Status: Difficult to assess Arousal/Alertness: Awake/alert Orientation Level: Oriented to person, Oriented to place Attention: Focused,  Sustained Focused Attention: Appears intact Sustained Attention: Appears intact Behaviors: Perseveration Cognition Arousal/Alertness: Awake/alert Behavior During Therapy: WFL for tasks assessed/performed Overall Cognitive Status: Difficult to assess Difficult to assess due to: Impaired communication   Blood pressure 117/79, pulse 77, temperature 99.5 F (37.5 C), temperature source Core (Comment), resp. rate 23, height 6' (1.829 m), weight 87 kg (191 lb 12.8 oz), SpO2 93 %. Physical Exam  Nursing note and vitals reviewed. Constitutional: He appears well-developed and well-nourished.  HENT:  Head: Normocephalic and atraumatic.  Mouth/Throat: Oropharynx is clear and moist.  Eyes: Conjunctivae and EOM are normal. Pupils are equal, round, and reactive to light.  Neck: Normal range of motion. Neck supple.  Cardiovascular: Normal rate and regular rhythm.   Respiratory: Effort normal and breath sounds normal. No respiratory distress. He has no wheezes.  GI: Soft. Bowel sounds are normal.  Musculoskeletal: He exhibits no edema or tenderness.  Neurological: He is alert.  Expressive > Receptive aphasia.   Unable to consistently follow simple motor commands. RUE resting tremors and RLE weakness noted.  Right facial weakness. Motor (limited due to aphasia): RUE: 4+-5/5 proximal to distal LUE: 5/5 proximal to distal LLE: 5/5 proximal to distal RLE: 4+-5 hip flexion, knee extension, 0/5 ankle dorsi/plantar flexion  Skin: Skin is warm and dry.  Psychiatric: He has a normal mood and affect. His behavior is normal.    Results for orders placed or performed during the hospital encounter of 05/20/15 (from the past 24 hour(s))  I-STAT 3, arterial blood gas (G3+)     Status: Abnormal   Collection Time: 05/21/15  5:34 PM  Result Value Ref Range   pH, Arterial 7.335 (L) 7.350 - 7.450   pCO2 arterial 40.9 35.0 - 45.0 mmHg   pO2, Arterial 129.0 (H) 80.0 - 100.0 mmHg   Bicarbonate 21.6 20.0 - 24.0  mEq/L   TCO2 23 0 - 100 mmol/L   O2 Saturation 99.0 %   Acid-base deficit 4.0 (H) 0.0 - 2.0 mmol/L   Patient temperature 37.7 C    Collection site RADIAL, ALLEN'S TEST ACCEPTABLE    Drawn by RT    Sample type ARTERIAL   Basic metabolic panel     Status: Abnormal   Collection Time: 05/22/15  2:14 AM  Result Value Ref Range   Sodium 139 135 - 145 mmol/L   Potassium 3.7 3.5 - 5.1 mmol/L   Chloride 109 101 - 111 mmol/L   CO2 21 (L) 22 - 32 mmol/L   Glucose, Bld 111 (H) 65 - 99 mg/dL   BUN 11 6 - 20 mg/dL   Creatinine, Ser 5.78 0.61 - 1.24 mg/dL   Calcium 8.5 (L) 8.9 - 10.3 mg/dL   GFR calc non Af Amer >60 >60 mL/min   GFR calc Af Amer >60 >60 mL/min   Anion gap 9 5 - 15  CBC     Status: Abnormal   Collection Time: 05/22/15  2:14 AM  Result Value Ref Range   WBC 15.0 (H) 4.0 - 10.5 K/uL   RBC 3.97 (L) 4.22 - 5.81 MIL/uL  Hemoglobin 12.0 (L) 13.0 - 17.0 g/dL   HCT 16.138.1 (L) 09.639.0 - 04.552.0 %   MCV 96.0 78.0 - 100.0 fL   MCH 30.2 26.0 - 34.0 pg   MCHC 31.5 30.0 - 36.0 g/dL   RDW 40.914.3 81.111.5 - 91.415.5 %   Platelets 108 (L) 150 - 400 K/uL   Ct Angio Head W/cm &/or Wo Cm  05/20/2015  CLINICAL DATA:  Right-sided weakness and aphasia. EXAM: CT ANGIOGRAPHY HEAD AND NECK TECHNIQUE: Multidetector CT imaging of the head and neck was performed using the standard protocol during bolus administration of intravenous contrast. Multiplanar CT image reconstructions and MIPs were obtained to evaluate the vascular anatomy. Carotid stenosis measurements (when applicable) are obtained utilizing NASCET criteria, using the distal internal carotid diameter as the denominator. CONTRAST:  50mL OMNIPAQUE IOHEXOL 350 MG/ML SOLN COMPARISON:  None. FINDINGS: CTA NECK Aortic arch: Normal variant 4 vessel aortic arch with the left vertebral artery arising directly from the arch. Moderate aortic arch atherosclerosis. Mild non stenotic plaque involving the brachiocephalic and right subclavian arteries. Moderate calcified plaque  in the proximal left subclavian artery resulting in less than 50% narrowing. Right carotid system: Moderate calcified plaque at the carotid bifurcation results in approximately 50% proximal ICA stenosis. There is also severe proximal ECA stenosis. Left carotid system: Common carotid artery is patent without significant stenosis. There is extensive calcified and noncalcified plaque at the carotid bifurcation, and the ICA is occluded at its origin without evidence of reconstitution in the neck. Vertebral arteries: The vertebral arteries are patent with the right being dominant. No significant right vertebral artery stenosis is seen. There is likely mild left vertebral artery origin stenosis. Skeleton: Mild multilevel cervical disc degeneration. Other neck: Partially visualized coronary artery atherosclerosis. Scattered venous air likely related to recent venipuncture. CTA HEAD Anterior circulation: Intracranial right ICA is patent with mild calcified plaque but no significant stenosis. The left ICA reconstitutes at the horizontal petrous segment but is irregular and poorly opacified distal to this with suspected moderate to severe focal stenosis in the anterior cavernous segment. The ICA terminus is patent, however there is occlusion of the proximal left M1 segment near its origin. Minimal collateral flow is present in distal MCA branch vessels. The right MCA is patent without evidence of significant proximal stenosis or major branch occlusion. The left A1 segment is hypoplastic. Right A1 segment is widely patent. A2 and more distal ACA branches demonstrate mild irregularity. No intracranial aneurysm is identified. Posterior circulation: Intracranial vertebral arteries are widely patent to the vertebrobasilar junction with the right being dominant. Right PICA origin is patent. Left PICA is not well seen. AICAs are also not well seen. SCA origins are patent. Basilar artery is patent without stenosis. There is a tiny  right posterior communicating artery. PCAs are patent with mild branch vessel irregularity but no significant proximal stenosis. Venous sinuses: Not well evaluated due to arterial phase contrast timing. IMPRESSION: 1. 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 Reconstitution intracranially but with evidence of poor flow. 2. Left M1 MCA occlusion near its origin. Critical Value/emergent results were called by telephone at the time of interpretation on 05/20/2015 at 9:55 pm to Dr. Ritta SlotMCNEILL KIRKPATRICK , who verbally acknowledged these results. Electronically Signed   By: Sebastian AcheAllen  Grady M.D.   On: 05/20/2015 22:13   Ct Head Wo Contrast  05/21/2015  CLINICAL DATA:  80Seventy-four year old hypertensive male with right-sided weakness. Post intervention yesterday. Subsequent encounter. EXAM: CT HEAD WITHOUT CONTRAST TECHNIQUE: Contiguous axial  images were obtained from the base of the skull through the vertex without intravenous contrast. COMPARISON:  05/21/2015 1:25 a.m. head CT.  05/20/2015. FINDINGS: Interval partial clearing of hyperdense material within the frontal lobe which may represent clearing of injected contrast or partial clearing of intracranial hemorrhage. Evolution of what appears to be a large left middle cerebral artery distribution infarct. Mild mass effect upon the left lateral ventricle. Prominent small vessel disease changes. No intracranial mass lesion noted on this unenhanced exam. Global atrophy without hydrocephalus. Mild exophthalmos. IMPRESSION: Interval partial clearing of hyperdense material within the frontal lobe which may represent clearing of injected contrast or partial clearing of intracranial hemorrhage. Evolution of what appears to be a large left middle cerebral artery distribution infarct. Mild mass effect upon the left lateral ventricle. Prominent small vessel disease changes. Electronically Signed   By: Lacy Duverney M.D.   On: 05/21/2015 13:13   Ct Head Wo Contrast  05/21/2015  CLINICAL  DATA:  Initial evaluation status post left common carotid arteriogram. EXAM: CT HEAD WITHOUT CONTRAST TECHNIQUE: Contiguous axial images were obtained from the base of the skull through the vertex without intravenous contrast. COMPARISON:  Prior studies from 05/20/2015 FINDINGS: Age-related cerebral atrophy with chronic microvascular ischemic disease again noted. There is new patchy hyperdensity involving the anterior left operculum and insular region, likely contrast staining from recent arteriogram. Small amount of hemorrhage 9 entirely excluded. Involvement of the left lentiform nucleus present. No other acute intracranial hemorrhage. No other acute large vessel territory infarct. No extra-axial fluid collection. No hydrocephalus. Scalp soft tissues within normal limits. No acute abnormality about the orbits. Mild mucosal thickening within the ethmoidal air cells. Paranasal sinuses are otherwise clear. No mastoid effusion. Calvarium intact. IMPRESSION: 1. Scattered parenchymal hyperdensity within the anterior left MCA distribution, likely contrast staining from recent catheter directed arteriogram. Small amount of hemorrhage not excluded. Attention at follow-up recommended. 2. Otherwise stable head CT. Results were called by telephone at the time of interpretation on 05/21/2015 at 1:57 am to Dr. Amada Jupiter, who verbally acknowledged these results. Electronically Signed   By: Rise Mu M.D.   On: 05/21/2015 02:04   Ct Head Wo Contrast  05/20/2015  CLINICAL DATA:  Code stroke. Right-sided paralysis and aphasia, acute onset. Initial encounter. EXAM: CT HEAD WITHOUT CONTRAST TECHNIQUE: Contiguous axial images were obtained from the base of the skull through the vertex without intravenous contrast. COMPARISON:  None. FINDINGS: There is no evidence of acute infarction, mass lesion, or intra- or extra-axial hemorrhage on CT. Prominence of ventricles and sulci reflects mild to moderate cortical volume loss.  Mild cerebellar atrophy is noted. Scattered periventricular and subcortical white matter change likely reflects small vessel ischemic microangiopathy. The brainstem and fourth ventricle are within normal limits. The basal ganglia are unremarkable in appearance. The cerebral hemispheres demonstrate grossly normal gray-white differentiation. No mass effect or midline shift is seen. There is no evidence of fracture; visualized osseous structures are unremarkable in appearance. The visualized portions of the orbits are within normal limits. The paranasal sinuses and mastoid air cells are well-aerated. No significant soft tissue abnormalities are seen. IMPRESSION: 1. No acute intracranial pathology seen on CT. 2. Mild to moderate cortical volume loss and scattered small vessel ischemic microangiopathy. These results were called by telephone at the time of interpretation on 05/20/2015 at 9:27 pm to Dr. Amada Jupiter, who verbally acknowledged these results. Electronically Signed   By: Roanna Raider M.D.   On: 05/20/2015 21:27   Ct Angio Neck  W/cm &/or Wo/cm  05/20/2015  CLINICAL DATA:  Right-sided weakness and aphasia. EXAM: CT ANGIOGRAPHY HEAD AND NECK TECHNIQUE: Multidetector CT imaging of the head and neck was performed using the standard protocol during bolus administration of intravenous contrast. Multiplanar CT image reconstructions and MIPs were obtained to evaluate the vascular anatomy. Carotid stenosis measurements (when applicable) are obtained utilizing NASCET criteria, using the distal internal carotid diameter as the denominator. CONTRAST:  50mL OMNIPAQUE IOHEXOL 350 MG/ML SOLN COMPARISON:  None. FINDINGS: CTA NECK Aortic arch: Normal variant 4 vessel aortic arch with the left vertebral artery arising directly from the arch. Moderate aortic arch atherosclerosis. Mild non stenotic plaque involving the brachiocephalic and right subclavian arteries. Moderate calcified plaque in the proximal left subclavian  artery resulting in less than 50% narrowing. Right carotid system: Moderate calcified plaque at the carotid bifurcation results in approximately 50% proximal ICA stenosis. There is also severe proximal ECA stenosis. Left carotid system: Common carotid artery is patent without significant stenosis. There is extensive calcified and noncalcified plaque at the carotid bifurcation, and the ICA is occluded at its origin without evidence of reconstitution in the neck. Vertebral arteries: The vertebral arteries are patent with the right being dominant. No significant right vertebral artery stenosis is seen. There is likely mild left vertebral artery origin stenosis. Skeleton: Mild multilevel cervical disc degeneration. Other neck: Partially visualized coronary artery atherosclerosis. Scattered venous air likely related to recent venipuncture. CTA HEAD Anterior circulation: Intracranial right ICA is patent with mild calcified plaque but no significant stenosis. The left ICA reconstitutes at the horizontal petrous segment but is irregular and poorly opacified distal to this with suspected moderate to severe focal stenosis in the anterior cavernous segment. The ICA terminus is patent, however there is occlusion of the proximal left M1 segment near its origin. Minimal collateral flow is present in distal MCA branch vessels. The right MCA is patent without evidence of significant proximal stenosis or major branch occlusion. The left A1 segment is hypoplastic. Right A1 segment is widely patent. A2 and more distal ACA branches demonstrate mild irregularity. No intracranial aneurysm is identified. Posterior circulation: Intracranial vertebral arteries are widely patent to the vertebrobasilar junction with the right being dominant. Right PICA origin is patent. Left PICA is not well seen. AICAs are also not well seen. SCA origins are patent. Basilar artery is patent without stenosis. There is a tiny right posterior communicating  artery. PCAs are patent with mild branch vessel irregularity but no significant proximal stenosis. Venous sinuses: Not well evaluated due to arterial phase contrast timing. IMPRESSION: 1. 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 Reconstitution intracranially but with evidence of poor flow. 2. Left M1 MCA occlusion near its origin. Critical Value/emergent results were called by telephone at the time of interpretation on 05/20/2015 at 9:55 pm to Dr. Ritta Slot , who verbally acknowledged these results. Electronically Signed   By: Sebastian Ache M.D.   On: 05/20/2015 22:13   Portable Chest Xray  05/22/2015  CLINICAL DATA:  Respiratory failure, hypoxia EXAM: PORTABLE CHEST 1 VIEW COMPARISON:  Portable chest x-ray of May 21, 2015 FINDINGS: There has been interval extubation of the trachea and of the esophagus. The lungs are adequately inflated. The interstitial markings are mildly prominent though stable. The retrocardiac region on the left remains dense. The cardiac silhouette remains enlarged. The central pulmonary vascularity remains mildly engorged. The permanent pacemaker is in stable position. The bony thorax exhibits no acute abnormality. IMPRESSION: Interval extubation of the trachea and esophagus.  Low-grade CHF superimposed on underlying COPD. Persistent left lower lobe atelectasis or pneumonia. Electronically Signed   By: David  [place] M.D.   On: 05/22/2015 07:19   Dg Chest Port 1 View  05/21/2015  CLINICAL DATA:  Endotracheal tube placement.  Initial encounter. EXAM: PORTABLE CHEST 1 VIEW COMPARISON:  None. FINDINGS: The patient's endotracheal tube is seen ending 4-5 cm above the carina. An enteric tube is noted extending below diaphragm. The lungs are well-aerated. Vascular congestion is noted, with minimal bilateral atelectasis. There is no evidence of pleural effusion or pneumothorax. The cardiomediastinal silhouette is borderline normal in size. A pacemaker is noted overlying the left chest wall,  with leads ending overlying the right atrium and right ventricle. No acute osseous abnormalities are seen. IMPRESSION: 1. Endotracheal tube seen ending 4-5 cm above the carina. 2. Vascular congestion, with minimal bilateral atelectasis. Electronically Signed   By: Roanna Raider M.D.   On: 05/21/2015 02:29   Dg Abd Portable 1v  05/21/2015  CLINICAL DATA:  Orogastric tube placement.  Initial encounter. EXAM: PORTABLE ABDOMEN - 1 VIEW COMPARISON:  None. FINDINGS: The patient's enteric tube is noted ending overlying the body of the stomach. Its side-port is noted about the fundus of the stomach. The visualized bowel gas pattern is grossly unremarkable. Contrast is noted within the right renal collecting system. No free intra-abdominal air is seen, though evaluation for free air is limited on a single supine view. No acute osseous abnormalities are identified. Pacemaker leads are partially imaged, with a left chest wall pacemaker seen. The visualized portions of the lungs are grossly clear. IMPRESSION: Enteric tube noted ending overlying the body of the stomach. Electronically Signed   By: Roanna Raider M.D.   On: 05/21/2015 02:30    Assessment/Plan: Diagnosis: L-MCA infarct Labs and images independently reviewed.  Records reviewed and summated above. Stroke: Continue secondary stroke prophylaxis and Risk Factor Modification listed below:   Antiplatelet therapy:   Blood Pressure Management:  Continue current medication with prn's with permisive HTN per primary team Statin Agent:   Pre-diabetes management:   Tobacco abuse:  Counsel when appropriate ? Right sided hemiparesis  1. Does the need for close, 24 hr/day medical supervision in concert with the patient's rehab needs make it unreasonable for this patient to be served in a less intensive setting? Yes  Co-Morbidities requiring supervision/potential complications: tobacco abuse (counsel when appropriate),  A fib with RVR (monitor HR with increased  physical activity), HCM (avoid UE resistance excercises), ablation, HTN (monitor and provide prns in accordance with increased physical exertion and pain), back surgery, ABLA (transfuse if necessary to ensure appropriate perfusion for increased activity tolerance), tachypnea (monitor RR and O2 Sats with increased physical exertion), leukocytosis (cont to monitor for signs and symptoms of infection, further workup if indicated), Thrombocytopenia (< 60,000/mm3 no resistive exercise), Pre-diabetes (Monitor in accordance with exercise and adjust meds as necessary) 2. Due to bladder management, safety, disease management, medication administration, pain management and patient education, does the patient require 24 hr/day rehab nursing? Yes 3. Does the patient require coordinated care of a physician, rehab nurse, PT (1-2 hrs/day, 5 days/week), OT (1-2 hrs/day, 5 days/week) and SLP (1-2 hrs/day, 5 days/week) to address physical and functional deficits in the context of the above medical diagnosis(es)? Yes Addressing deficits in the following areas: balance, endurance, locomotion, strength, transferring, bowel/bladder control, bathing, feeding, toileting, cognition, speech, language and swallowing 4. Can the patient actively participate in an intensive therapy program of at least 3  hrs of therapy per day at least 5 days per week? Yes 5. The potential for patient to make measurable gains while on inpatient rehab is excellent 6. Anticipated functional outcomes upon discharge from inpatient rehab are supervision  with PT, modified independent and supervision with OT, modified independent and supervision with SLP. 7. Estimated rehab length of stay to reach the above functional goals is: 16-19 day s. 8. Does the patient have adequate social supports and living environment to accommodate these discharge functional goals? Yes 9. Anticipated D/C setting: Home 10. Anticipated post D/C treatments: HH therapy and Home  excercise program 11. Overall Rehab/Functional Prognosis: good  RECOMMENDATIONS: This patient's condition is appropriate for continued rehabilitative care in the following setting: CIR once medically stable Patient has agreed to participate in recommended program. Potentially Note that insurance prior authorization may be required for reimbursement for recommended care.  Comment: Rehab Admissions Coordinator to follow up.  Maryla Morrow, MD 05/22/2015

## 2015-05-22 NOTE — Progress Notes (Signed)
PULMONARY / CRITICAL CARE MEDICINE   Name: Luis Robinson MRN: [id number] DOB: 01-09-1941    ADMISSION DATE:  05/20/2015 CONSULTATION DATE:  05/20/15  REFERRING MD:  Amada JupiterKirkpatrick  CHIEF COMPLAINT:  R sided weakness  HISTORY OF PRESENT ILLNESS:  Pt is aphasic; therefore, this HPI is obtained from chart review. Luis Robinson is a 75 y.o. male with PMH as outlined below. [facility] due to sudden onset right sided weakness and aphasia.  Symptoms began suddenly that evening and son initially thought pt was having difficult time swallowing; however, turns out he wasn't able to speak.  Son was with him but had left the room and upon leaving, thought he heard something fall.  When he returned, he noticed the weakness and aphasia.  In ED, he had CTA of teh brain which revealed acute left MCA infarct.  IR was consulted and pt is to be taken to IR suite for possible mechanical thrombectomy performed.   In IR, he had complete revascularization of occluded left MCA and left ICA as well as stent placement to left ICA.  Post procedure, he returned to the ICU on the ventilator and PCCM was called for vent management.  SUBJECTIVE:  Extubated and doing well but difficulty with word.  VITAL SIGNS: BP 116/71 mmHg  Pulse 61  Temp(Src) 99.3 F (37.4 C) (Core (Comment))  Resp 26  Ht 6' (1.829 m)  Wt 87 kg (191 lb 12.8 oz)  BMI 26.01 kg/m2  SpO2 99%  HEMODYNAMICS:    VENTILATOR SETTINGS: Vent Mode:  [-] PSV;CPAP FiO2 (%):  [40 %] 40 % Set Rate:  [14 bmp] 14 bmp Vt Set:  [620 mL] 620 mL PEEP:  [5 cmH20] 5 cmH20 Pressure Support:  [5 cmH20] 5 cmH20 Plateau Pressure:  [27 cmH20] 27 cmH20  INTAKE / OUTPUT: I/O last 3 completed shifts: In: 5959.1 [I.V.:5509.1; IV Piggyback:450] Out: 1355 [Urine:1330; Blood:25]  PHYSICAL EXAMINATION: General: Adult male, in NAD. Neuro: Awake.  Aphasic.  RUE and RLE weakness. HEENT: [place]/AT. PERRL, sclerae anicteric. Cardiovascular: IRIR,  no M/R/G.  Lungs: Respirations even and unlabored.  CTA bilaterally, No W/R/R. Abdomen: BS x 4, soft, NT/ND.  Musculoskeletal: No gross deformities, no edema.  Skin: Intact, warm, no rashes.  LABS:  BMET  Recent Labs Lab 05/20/15 2115 05/21/15 0523 05/22/15 0214  NA 138 137 139  K 4.1 3.8 3.7  CL 106 109 109  CO2 22 22 21*  BUN 20 15 11   CREATININE 1.07 0.88 0.86  GLUCOSE 165* 138* 111*   Electrolytes  Recent Labs Lab 05/20/15 2115 05/21/15 0523 05/22/15 0214  CALCIUM 9.2 8.1* 8.5*   CBC  Recent Labs Lab 05/20/15 2115 05/21/15 0523 05/22/15 0214  WBC 12.2* 13.0* 15.0*  HGB 14.9 12.2* 12.0*  HCT 45.7 37.9* 38.1*  PLT 140* 115* 108*   Coag's  Recent Labs Lab 05/20/15 2115  APTT 31  INR 1.21   Sepsis Markers No results for input(s): LATICACIDVEN, PROCALCITON, O2SATVEN in the last 168 hours.  ABG  Recent Labs Lab 05/21/15 0259 05/21/15 1734  PHART 7.406 7.335*  PCO2ART 40.8 40.9  PO2ART 434.0* 129.0*    Liver Enzymes  Recent Labs Lab 05/20/15 2115  AST 32  ALT 22  ALKPHOS 49  BILITOT 0.6  ALBUMIN 3.6    Cardiac Enzymes No results for input(s): TROPONINI, PROBNP in the last 168 hours.  Glucose  Recent Labs Lab 05/20/15 2158  GLUCAP 140*    Imaging Ct Head Wo  Contrast  05/21/2015  CLINICAL DATA:  75 year old hypertensive male with right-sided weakness. Post intervention yesterday. Subsequent encounter. EXAM: CT HEAD WITHOUT CONTRAST TECHNIQUE: Contiguous axial images were obtained from the base of the skull through the vertex without intravenous contrast. COMPARISON:  05/21/2015 1:25 a.m. head CT.  05/20/2015. FINDINGS: Interval partial clearing of hyperdense material within the frontal lobe which may represent clearing of injected contrast or partial clearing of intracranial hemorrhage. Evolution of what appears to be a large left middle cerebral artery distribution infarct. Mild mass effect upon the left lateral  ventricle. Prominent small vessel disease changes. No intracranial mass lesion noted on this unenhanced exam. Global atrophy without hydrocephalus. Mild exophthalmos. IMPRESSION: Interval partial clearing of hyperdense material within the frontal lobe which may represent clearing of injected contrast or partial clearing of intracranial hemorrhage. Evolution of what appears to be a large left middle cerebral artery distribution infarct. Mild mass effect upon the left lateral ventricle. Prominent small vessel disease changes. Electronically Signed   By: Lacy Duverney M.D.   On: 05/21/2015 13:13   Portable Chest Xray  05/22/2015  CLINICAL DATA:  Respiratory failure, hypoxia EXAM: PORTABLE CHEST 1 VIEW COMPARISON:  Portable chest x-ray of May 21, 2015 FINDINGS: There has been interval extubation of the trachea and of the esophagus. The lungs are adequately inflated. The interstitial markings are mildly prominent though stable. The retrocardiac region on the left remains dense. The cardiac silhouette remains enlarged. The central pulmonary vascularity remains mildly engorged. The permanent pacemaker is in stable position. The bony thorax exhibits no acute abnormality. IMPRESSION: Interval extubation of the trachea and esophagus. Low-grade CHF superimposed on underlying COPD. Persistent left lower lobe atelectasis or pneumonia. Electronically Signed   By: David  [place] M.D.   On: 05/22/2015 07:19   STUDIES:  CTA head 03/23 > left ICA occlusion, left M1 MCA occlusion. CT head 03/23 > no acute process. MRI brain 03/28 > Echo 03/28 >  CULTURES: Urine 03/28 >  ANTIBIOTICS: None.  SIGNIFICANT EVENTS: 03/27 > admitted with acute left MCA infarct. 03/28 > to IR for complete revascularization of occluded left MCA and left ICA as well as stent placement to left ICA.   LINES/TUBES: ETT 03/27 >3/28  I reviewed CXR myself, no evidence of acute pulmonary disease.  COPD and some pulmonary edema,  improved.  DISCUSSION: 74 y.o. M admitted 03/27 with acute left MCA infarct.  Taken to IR for mechanical thrombectomy then returned to the ICU on the ventilator.  ASSESSMENT / PLAN:  NEUROLOGIC A:   Acute left MCA infarct - s/p IR where he had complete revascularization of occluded left MCA and left ICA as well as stent placement to left ICA. Acute encephalopathy - due to sedation. P:   Per neuro.  CARDIOVASCULAR A:  A.fib with RVR. Hx a.flutter s/p ablation 2016, HCM, HLD, HTN. P:  Amio gtt for RVR, may switch to PO when able to swallow. Hold outpatient toprol-xl, rosuvastatin, clopidogrel, lisinopril, apixaban.  PULMONARY A: VDRF - due to inability to protect airway in the setting of acute left MCA infarct. Tobacco use disorder. P:   Titrate O2 for sat of 88-92%. Swallow evaluation. IS per RT protocol. Mobilize. Tobacco cessation counseling once extubated.  RENAL A:   No acute issues. P:   Decrease NS @ 50 ml/hr. BMP in AM. Monitor for foley need Replace electrolytes as indicated.  GASTROINTESTINAL A:   GI prophylaxis. Nutrition. P:   SUP: Pantoprazole. Swallow evaluation.  HEMATOLOGIC A:  VTE Prophylaxis. P:  SCD's. CBC in AM.  INFECTIOUS A:   UTI. P:   Abx as above (Ceftriaxone), recommend complete a total of 3 days of rocephin then D/C. Follow cultures.  ENDOCRINE A:   No acute issues.   P:   Monitor glucose on BMP.  Family updated: Son and sister in law at bedside 3/29.  Discussed with neuro, will ambulate, ok to transfer out of the ICU in the afternoon after swallow evaluation.  PCCM will sign off, please call back if needed.  Alyson Reedy, M.D. [facility] Pulmonary/Critical Care Medicine. Pager: [phone]. After hours pager: [phone].  05/22/2015, 9:39 AM

## 2015-05-22 NOTE — Evaluation (Signed)
Clinical/Bedside Swallow Evaluation Patient Details  Name: Luis Robinson MRN: [id number] Date of Birth: [birth year]  Today's Date: 05/22/2015 Time: SLP Start Time (ACUTE ONLY): [id number] SLP Stop Time (ACUTE ONLY): 0956 SLP Time Calculation (min) (ACUTE ONLY): 22 min  Past Medical History:  Past Medical History  Diagnosis Date  . Hypertrophic cardiomyopathy (HCC)   . Hyperlipidemia   . Hypertension   . Tobacco use disorder   . Dyspnea   . Dizziness    Past Surgical History:  Past Surgical History  Procedure Laterality Date  . Back surgery x 2    . Rotator cuff surgery    . Insert / replace / remove pacemaker  2008  . Radiology with anesthesia N/A 05/20/2015    Procedure: RADIOLOGY WITH ANESTHESIA;  Surgeon: Medication Radiologist, MD;  Location: [facility] OR;  Service: Radiology;  Laterality: N/A;   HPI:  Luis Robinson is a 75 y.o. male with PMH as outlined below. [facility] due to sudden onset right sided weakness and aphasia. Symptoms began suddenly that evening and son initially thought pt was having difficult time swallowing; however, turns out he wasn't able to speak. Son was with him but had left the room and upon leaving, thought he heard something fall. When he returned, he noticed the weakness and aphasia. he had CTA of teh brain which revealed acute left MCA infarct (cannot have MRI). In IR, he had complete revascularization of occluded left MCA and left ICA as well as stent placement to left ICA. Intubated from 3/27 to 3/28.    Assessment / Plan / Recommendation Clinical Impression  Pt demonstrates no signs of aspiration with thin liquids over 8 oz of intake. He is able to consume purees adequately, but does struggle with mastication, particularly on the right side. Strength is WNL, but coordination may be impaired and pt is unable to report sensation adequately due to aphasia. SLp provided verbal and visual cues to encourage lingual sweep to right side with  solid intake and reported to RN. Recommend pt initiate a dys 2 /thin liquids diet with full suerpvision as pt will need assist for feeding.     Aspiration Risk  Mild aspiration risk    Diet Recommendation Dysphagia 2 (Fine chop);Thin liquid   Liquid Administration via: Cup;Straw Supervision: Full supervision/cueing for compensatory strategies;Staff to assist with self feeding Compensations: Slow rate;Small sips/bites;Lingual sweep for clearance of pocketing Postural Changes: Seated upright at 90 degrees    Other  Recommendations     Follow up Recommendations       Frequency and Duration            Prognosis        Swallow Study   General HPI: Luis Robinson is a 75 y.o. male with PMH as outlined below. [facility] due to sudden onset right sided weakness and aphasia. Symptoms began suddenly that evening and son initially thought pt was having difficult time swallowing; however, turns out he wasn't able to speak. Son was with him but had left the room and upon leaving, thought he heard something fall. When he returned, he noticed the weakness and aphasia. he had CTA of teh brain which revealed acute left MCA infarct (cannot have MRI). In IR, he had complete revascularization of occluded left MCA and left ICA as well as stent placement to left ICA. Intubated from 3/27 to 3/28.  Type of Study: Bedside Swallow Evaluation Diet Prior to this Study: NPO  Temperature Spikes Noted: No Respiratory Status: Nasal cannula History of Recent Intubation: Yes Length of Intubations (days): 2 days Date extubated: 05/21/15 Behavior/Cognition: Alert;Cooperative;Pleasant mood Oral Cavity Assessment: Within Functional Limits Oral Care Completed by SLP: Yes Oral Cavity - Dentition: Dentures, top Vision: Functional for self-feeding Self-Feeding Abilities: Able to feed self;Needs assist Patient Positioning: Upright in bed Baseline Vocal Quality: Normal Volitional Cough:  Strong Volitional Swallow: Able to elicit    Oral/Motor/Sensory Function Overall Oral Motor/Sensory Function: Within functional limits (seems to have some sensory deficit or ataxia on the right)   Ice Chips     Thin Liquid Thin Liquid: Within functional limits Presentation: Cup;Straw;Self Fed    Nectar Thick Nectar Thick Liquid: Not tested   Honey Thick Honey Thick Liquid: Not tested   Puree Puree: Within functional limits   Solid   GO   Solid: Impaired Oral Phase Impairments: Reduced lingual movement/coordination Oral Phase Functional Implications: Right lateral sulci pocketing;Prolonged oral transit;Oral residue;Oral holding       Harlon Ditty, MA CCC-SLP [phone]  Demika Langenderfer, Riley Nearing 05/22/2015,10:12 AM

## 2015-05-22 NOTE — Progress Notes (Signed)
Nutrition Follow-up  INTERVENTION:   Ensure Enlive po BID, each supplement provides 350 kcal and 20 grams of protein  NUTRITION DIAGNOSIS:   Inadequate oral intake related to lethargy/confusion as evidenced by per patient/family report. Ongoing.   GOAL:   Patient will meet greater than or equal to 90% of their needs Progressing.   MONITOR:   PO intake, Supplement acceptance, Weight trends  ASSESSMENT:   Luis Robinson is a 75 y.o. male with a history of cardiomyopathy as well as "history of ablation." Who presents with new onset right-sided weakness and aphasia occurring in the setting of atrial fibrillation. He has a history of "a flutter in his heart"  Spoke with daughter at bedside. She reports no recent weight changes and good appetite PTA. Pt confused and reports not hungry. Daughter feels pt might drink ensure. Diet just advanced after SLP eval.  3/28 extubated Labs reviewed.   Diet Order:  DIET DYS 2 Room service appropriate?: Yes; Fluid consistency:: Thin  Skin:  Reviewed, no issues  Last BM:  unknown  Height:   Ht Readings from Last 1 Encounters:  05/21/15 6' (1.829 m)    Weight:   Wt Readings from Last 1 Encounters:  05/21/15 191 lb 12.8 oz (87 kg)    Ideal Body Weight:  80.9 kg  BMI:  Body mass index is 26.01 kg/(m^2).  Estimated Nutritional Needs:   Kcal:  1900-2100  Protein:  100-115 grams  Fluid:  > 1.9 L/day  EDUCATION NEEDS:   No education needs identified at this time  Kendell BaneHeather Hermie Reagor RD, LDN, CNSC [phone] Pager [phone] After Hours Pager

## 2015-05-22 NOTE — Progress Notes (Signed)
Inpatient Rehabilitation  Patient was screened by Hana Trippett for appropriateness for an Inpatient Acute Rehab consult.  At this time, we are recommending Inpatient Rehab consult.  Please order consult if you are agreeable.  Burgandy Hackworth PT Inpatient Rehab Admissions Coordinator Cell [phone] Office [phone]    

## 2015-05-23 ENCOUNTER — Ambulatory Visit (HOSPITAL_COMMUNITY): Payer: Medicare HMO

## 2015-05-23 ENCOUNTER — Inpatient Hospital Stay (HOSPITAL_COMMUNITY): Payer: Medicare HMO

## 2015-05-23 DIAGNOSIS — I6789 Other cerebrovascular disease: Secondary | ICD-10-CM

## 2015-05-23 LAB — URINE CULTURE

## 2015-05-23 LAB — BASIC METABOLIC PANEL
Anion gap: 8 (ref 5–15)
BUN: 11 mg/dL (ref 6–20)
CALCIUM: 8.7 mg/dL — AB (ref 8.9–10.3)
CO2: 23 mmol/L (ref 22–32)
CREATININE: 0.89 mg/dL (ref 0.61–1.24)
Chloride: 107 mmol/L (ref 101–111)
GFR calc Af Amer: >60 mL/min (ref >60)
GLUCOSE: 129 mg/dL — AB (ref 65–99)
Potassium: 3.4 mmol/L — ABNORMAL LOW (ref 3.5–5.1)
Sodium: 138 mmol/L (ref 135–145)

## 2015-05-23 LAB — CBC
HEMATOCRIT: 36.5 % — AB (ref 39.0–52.0)
Hemoglobin: 11.9 g/dL — ABNORMAL LOW (ref 13.0–17.0)
MCH: 30.4 pg (ref 26.0–34.0)
MCHC: 32.6 g/dL (ref 30.0–36.0)
MCV: 93.4 fL (ref 78.0–100.0)
PLATELETS: 107 10*3/uL — AB (ref 150–400)
RBC: 3.91 MIL/uL — ABNORMAL LOW (ref 4.22–5.81)
RDW: 13.8 % (ref 11.5–15.5)
WBC: 17.4 10*3/uL — AB (ref 4.0–10.5)

## 2015-05-23 LAB — ECHOCARDIOGRAM COMPLETE
Height: 72 in
WEIGHTICAEL: 3068.8 [oz_av]

## 2015-05-23 LAB — TSH: TSH: 0.791 u[IU]/mL (ref 0.350–4.500)

## 2015-05-23 MED ORDER — METOPROLOL SUCCINATE ER 25 MG PO TB24
50.0000 mg | ORAL_TABLET | Freq: Every day | ORAL | Status: DC
  Administered 2015-05-23 – 2015-05-24 (×2): 50 mg via ORAL
  Filled 2015-05-23 (×2): qty 2

## 2015-05-23 MED ORDER — LISINOPRIL 20 MG PO TABS
20.0000 mg | ORAL_TABLET | Freq: Every day | ORAL | Status: DC
  Administered 2015-05-23 – 2015-05-24 (×2): 20 mg via ORAL
  Filled 2015-05-23 (×2): qty 1

## 2015-05-23 MED ORDER — CHLORHEXIDINE GLUCONATE 0.12 % MT SOLN
OROMUCOSAL | Status: AC
  Filled 2015-05-23: qty 15

## 2015-05-23 NOTE — H&P (Signed)
Physical Medicine and Rehabilitation Admission H&P    Chief Complaint  Patient presents with  . Code Stroke  : HPI: Luis Robinson is a 75 y.o. male with history of Hypertrophic cardiomyopathy/PAF with ablation/pacemaker maintained on Eliquis and followed by Dr. Bettina Gavia at [facility], HTN, tobacco abuse, back surgery 2 with antalgic gait who was admitted on 05/20/15 after fall with acute onset of right sided weakness with inability to speak. 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 Mild mass effect upon the left lateral ventricle. He was found ot have A fib with RVR and IV tPA not given as patient had taken his dose of eliquis already. He underwent cerebral angio with complete revascularization of occluded L-MCA with single pass Solitaire and IA tPA and complete revascularization of L-ICA occlusion with IV tPA /stent assisted angioplasty. He tolerated extubation post procedure and has had improvement in right sided weakness. Patient with resultant right inattention, right foot drop, kyphosis, severe paraphrasia's and perseveration affecting cognition/comprehension. Neurology consulted with follow-up and currently maintained on aspirin and Plavix for CVA prophylaxis..Plan to repeat CT of the head in 1 week if hemorrhage resolving resume Eliquis to Plavix and stop aspirin at that time. Cardiology service is consulted in regards to history of atrial fibrillation currently maintained on amiodarone 400 mg twice daily with rate control and planned taper to 200 mg twice daily after 5 days then after 2 weeks decreased to 200 mg daily. Maintain on a dysphagia #2 thin liquid diet CIR recommended for follow up therapy. Patient was admitted for comprehensive rehabilitation program  ROS Review of Systems  Unable to perform ROS: medical condition   Past Medical History  Diagnosis Date  . Hypertrophic cardiomyopathy ([place])   . Hyperlipidemia   . Hypertension   .  Tobacco use disorder   . Dyspnea   . Dizziness   . PAF (paroxysmal atrial fibrillation) ([place])   . SSS (sick sinus syndrome) ([place])   . CAD S/P percutaneous coronary angioplasty Aug 2016    Dx1 DES, no other significant dis  . Chronic anticoagulation     Eliquis   Past Surgical History  Procedure Laterality Date  . Back surgery x 2    . Rotator cuff surgery    . Insert / replace / remove pacemaker  2008    MDT  . Radiology with anesthesia N/A 05/20/2015    Procedure: RADIOLOGY WITH ANESTHESIA;  Surgeon: Medication Radiologist, MD;  Location: [place];  Service: Radiology;  Laterality: N/A;  . Ablation  Nov 2016    [facility]   Family History  Problem Relation Age of Onset  . Cancer Sister 37   Social History:  reports that he has been smoking Cigarettes.  He has been smoking about 0.50 packs per day. He has never used smokeless tobacco. He reports that he does not drink alcohol or use illicit drugs. Allergies:  Allergies  Allergen Reactions  . Bee Venom Anaphylaxis   Medications Prior to Admission  Medication Sig Dispense Refill  . apixaban (ELIQUIS) 5 MG TABS tablet Take 2.5 mg by mouth 2 (two) times daily.    Marland Kitchen lisinopril (PRINIVIL,ZESTRIL) 2.5 MG tablet Take 2.5 mg by mouth daily.    . metoprolol (TOPROL-XL) 50 MG 24 hr tablet Take 25 mg by mouth daily.     . rosuvastatin (CRESTOR) 20 MG tablet Take 20 mg by mouth daily.        Home: Home Living Family/patient expects to be  discharged to:: Inpatient rehab Additional Comments: lives with son   Functional History: Prior Function Level of Independence: Independent  Functional Status:  Mobility: Bed Mobility Overal bed mobility: Needs Assistance Bed Mobility: Supine to Sit Supine to sit: Min guard Sit to supine: Min guard General bed mobility comments: pt up in recliner on arrival Transfers Overall transfer level: Needs assistance Equipment used: Rolling walker (2 wheeled) Transfers: Sit to/from Stand Sit to Stand: Min  assist, Mod assist Stand pivot transfers: Mod assist General transfer comment: pt continues with posterior bias; when pt has the back of his legs braced against the edge of bed, he required min assist to balance, visual and verbal cues for hand placement Ambulation/Gait Ambulation/Gait assistance: Mod assist Ambulation Distance (Feet): 120 Feet Assistive device: Rolling walker (2 wheeled) Gait Pattern/deviations: Step-through pattern, Trunk flexed, Decreased stride length General Gait Details: Multiple cues for more upright posture and remaining closer to RW and min assist for steadying; pt did not use a RW PTA; mod assist for balance with last 30 feet due to use of hand rail (no RW) for more challenge to balance, pt with poor R ankle DF during swing (also less R active DF then L when pt asked to tap toes in sitting) Gait velocity interpretation: Below normal speed for age/gender    ADL: ADL Overall ADL's : Needs assistance/impaired Eating/Feeding: Set up, Sitting Grooming: Wash/dry hands, Wash/dry face, Oral care, Standing, Minimal assistance Grooming Details (indicate cue type and reason): Pt requires min A for balance.  He required mod cues to sequence how to brush teeth.  He initially did not apply toothpaste to toothbrush, then proceeded to brush teeth.  Required therapist intervention to appley toothpaste.  He then required cues to spit and rinse mouth.  Pt also with difficulty sequencing washing face.  He applied soap to dry washcloth and proceeded to wash face, then made no attempt to rinse soap off face - mod cues provided.  Upper Body Bathing: Minimal assitance, Sitting Lower Body Bathing: Moderate assistance, Sit to/from stand Lower Body Bathing Details (indicate cue type and reason): Assist for thoroughness and sequencing  Upper Body Dressing : Minimal assistance, Sitting Lower Body Dressing: Minimal assistance, Sit to/from stand Lower Body Dressing Details (indicate cue type and  reason): able to don/doff socks  Toilet Transfer: Ambulation, Comfort height toilet, Grab bars, RW, Moderate assistance Toilet Transfer Details (indicate cue type and reason): mod A to maneuver in small locations  Toileting- Clothing Manipulation and Hygiene: Moderate assistance, Sit to/from stand Toileting - Clothing Manipulation Details (indicate cue type and reason): for thoroughness  Functional mobility during ADLs: Minimal assistance, Moderate assistance, Rolling walker General ADL Comments: Pt requries mod A to turn with walker and to negotiate small spaces such as the bathroom   Cognition: Cognition Overall Cognitive Status: Difficult to assess Arousal/Alertness: Awake/alert Orientation Level: Oriented to person, Oriented to time Attention: Focused, Sustained Focused Attention: Appears intact Sustained Attention: Appears intact Behaviors: Perseveration Cognition Arousal/Alertness: Awake/alert Behavior During Therapy: WFL for tasks assessed/performed Overall Cognitive Status: Difficult to assess Area of Impairment: Orientation, Attention, Problem solving, Safety/judgement, Following commands Orientation Level: Disoriented to, Place, Time, Situation Current Attention Level: Sustained Following Commands: Follows one step commands inconsistently Safety/Judgement: Decreased awareness of safety Problem Solving: Slow processing, Requires verbal cues, Difficulty sequencing, Requires tactile cues General Comments: Pt required mod verbal cues to sequence through familiar grooming tasks  Difficult to assess due to: Impaired communication  Physical Exam: Blood pressure 130/86, pulse 70, temperature 96.8  F (36 C), temperature source Oral, resp. rate 18, height 6' (1.829 m), weight 87 kg (191 lb 12.8 oz), SpO2 93 %. Physical Exam Constitutional: He appears well-developed and well-nourished.  HENT:   Head: Normocephalic and atraumatic.  Mouth/Throat: Oropharynx is clear and moist.    Eyes: Conjunctivae and EOM are normal. Pupils are equal, round, and reactive to light.  Neck: Normal range of motion. Neck supple.  Cardiovascular: Normal rate and regular rhythm. systolic murmur Respiratory: Appears to be working to breath, using accessory muscles at times but breath sounds normal. Denies respiratory distress. He has no wheezes, rales, or rhonchi.  GI: Soft. Bowel sounds are normal.  Musculoskeletal: He exhibits no edema or tenderness.  Neurological: He is alert.  Expressive > Receptive aphasia.  Unable to consistently follow simple motor commands. RUE resting tremors and RLE weakness noted.  Right central 7. Apraxic. Motor (limited due to aphasia): RUE: 4+-5/5 proximal to distal LUE: 5/5 proximal to distal LLE: 5/5 proximal to distal RLE: 4-/5 hip flexion, 4/5 knee extension, 2-2+/5 ankle dorsi/plantar flexion but is inconsistent with effort. Skin: Skin is warm and dry.  Psychiatric: He has a normal mood and affect. His behavior is normal. pleasant   Results for orders placed or performed during the hospital encounter of 05/20/15 (from the past 48 hour(s))  CBC     Status: Abnormal   Collection Time: 05/23/15  3:20 AM  Result Value Ref Range   WBC 17.4 (H) 4.0 - 10.5 K/uL   RBC 3.91 (L) 4.22 - 5.81 MIL/uL   Hemoglobin 11.9 (L) 13.0 - 17.0 g/dL   HCT 36.5 (L) 39.0 - 52.0 %   MCV 93.4 78.0 - 100.0 fL   MCH 30.4 26.0 - 34.0 pg   MCHC 32.6 30.0 - 36.0 g/dL   RDW 13.8 11.5 - 15.5 %   Platelets 107 (L) 150 - 400 K/uL    Comment: CONSISTENT WITH PREVIOUS RESULT  Basic metabolic panel     Status: Abnormal   Collection Time: 05/23/15  3:20 AM  Result Value Ref Range   Sodium 138 135 - 145 mmol/L   Potassium 3.4 (L) 3.5 - 5.1 mmol/L   Chloride 107 101 - 111 mmol/L   CO2 23 22 - 32 mmol/L   Glucose, Bld 129 (H) 65 - 99 mg/dL   BUN 11 6 - 20 mg/dL   Creatinine, Ser 0.89 0.61 - 1.24 mg/dL   Calcium 8.7 (L) 8.9 - 10.3 mg/dL   GFR calc non Af Amer >60 >60 mL/min    GFR calc Af Amer >60 >60 mL/min    Comment: (NOTE) The eGFR has been calculated using the CKD EPI equation. This calculation has not been validated in all clinical situations. eGFR's persistently <60 mL/min signify possible Chronic Kidney Disease.    Anion gap 8 5 - 15  TSH     Status: None   Collection Time: 05/23/15  3:20 AM  Result Value Ref Range   TSH 0.791 0.350 - 4.500 uIU/mL  Urinalysis with microscopic (not at [facility])     Status: Abnormal   Collection Time: 05/24/15  2:02 AM  Result Value Ref Range   Color, Urine AMBER (A) YELLOW    Comment: BIOCHEMICALS MAY BE AFFECTED BY COLOR   APPearance HAZY (A) CLEAR   Specific Gravity, Urine 1.018 1.005 - 1.030   pH 5.5 5.0 - 8.0   Glucose, UA NEGATIVE NEGATIVE mg/dL   Hgb urine dipstick LARGE (A) NEGATIVE   Bilirubin Urine  NEGATIVE NEGATIVE   Ketones, ur 40 (A) NEGATIVE mg/dL   Protein, ur 100 (A) NEGATIVE mg/dL   Nitrite NEGATIVE NEGATIVE   Leukocytes, UA SMALL (A) NEGATIVE   WBC, UA 6-30 0 - 5 WBC/hpf   RBC / HPF TOO NUMEROUS TO COUNT 0 - 5 RBC/hpf   Bacteria, UA FEW (A) NONE SEEN   Squamous Epithelial / LPF 0-5 (A) NONE SEEN   Casts HYALINE CASTS (A) NEGATIVE   Urine-Other MUCOUS PRESENT   CBC     Status: Abnormal   Collection Time: 05/24/15  5:47 AM  Result Value Ref Range   WBC 17.8 (H) 4.0 - 10.5 K/uL   RBC 3.83 (L) 4.22 - 5.81 MIL/uL   Hemoglobin 12.0 (L) 13.0 - 17.0 g/dL   HCT 35.4 (L) 39.0 - 52.0 %   MCV 92.4 78.0 - 100.0 fL   MCH 31.3 26.0 - 34.0 pg   MCHC 33.9 30.0 - 36.0 g/dL   RDW 13.9 11.5 - 15.5 %   Platelets 117 (L) 150 - 400 K/uL    Comment: CONSISTENT WITH PREVIOUS RESULT  Basic metabolic panel     Status: Abnormal   Collection Time: 05/24/15  5:47 AM  Result Value Ref Range   Sodium 137 135 - 145 mmol/L   Potassium 3.6 3.5 - 5.1 mmol/L   Chloride 104 101 - 111 mmol/L   CO2 24 22 - 32 mmol/L   Glucose, Bld 114 (H) 65 - 99 mg/dL   BUN 11 6 - 20 mg/dL   Creatinine, Ser 0.92 0.61 - 1.24 mg/dL    Calcium 8.6 (L) 8.9 - 10.3 mg/dL   GFR calc non Af Amer >60 >60 mL/min   GFR calc Af Amer >60 >60 mL/min    Comment: (NOTE) The eGFR has been calculated using the CKD EPI equation. This calculation has not been validated in all clinical situations. eGFR's persistently <60 mL/min signify possible Chronic Kidney Disease.    Anion gap 9 5 - 15   Ct Head Wo Contrast  05/23/2015  ADDENDUM REPORT: 05/23/2015 07:03 ADDENDUM: Results were were discussed by telephone on 05/23/2015 at 6:50 am to Dr. Leonel Ramsay , who verbally acknowledged these results. Electronically Signed   By: Jeannine Boga M.D.   On: 05/23/2015 07:03  05/23/2015  CLINICAL DATA:  Followup for acute stroke. EXAM: CT HEAD WITHOUT CONTRAST TECHNIQUE: Contiguous axial images were obtained from the base of the skull through the vertex without intravenous contrast. COMPARISON:  Prior CT from 05/21/2015. FINDINGS: Continued interval evolution of large anterior left MCA distribution infarct, now more well-defined and hypodense in appearance as compared to prior study. Increased mass effect extending towards the left basal ganglia with mild effacement of the frontal horn of the left lateral ventricle. No midline shift. Basilar cisterns remain patent. Scattered parenchymal hyperdensities now evident within the area of infarction, increased from prior study, and worrisome for hemorrhage. Atrophy with chronic small vessel ischemic disease again noted. No other infarct. No mass lesion. Scalp soft tissues demonstrate no acute abnormality. No acute abnormality about the globes and orbits. Mild scattered mucosal thickening within the ethmoidal air cells. Paranasal sinuses are otherwise clear. Minimal layering opacity within the posterior right mastoid air cells. Left mastoid air cells clear. Calvarium intact. IMPRESSION: 1. Continued interval evolution of large anterior left MCA distribution infarct, with increased localized mass effect without midline  shift. 2. New/increased scattered hyperdensity within the area of infarction, consistent with hemorrhage. I have paged to communicate these results  at the time of this dictation. An addendum will be made once communicated. Electronically Signed: By: Jeannine Boga M.D. On: 05/23/2015 06:32   Dg Chest Port 1 View  05/24/2015  CLINICAL DATA:  Shortness of breath with cough today. EXAM: PORTABLE CHEST 1 VIEW COMPARISON:  05/22/2015 and 05/21/2015. FINDINGS: Left subclavian pacemaker leads appear unchanged, although the tip of the ventricular lead is not visualized. There is stable cardiomegaly and aortic atherosclerosis. There is new pulmonary edema consistent with congestive heart failure. No pneumothorax or significant pleural effusion identified. The bones appear unchanged with postsurgical changes at the right humeral head. IMPRESSION: Cardiomegaly with new pulmonary edema consistent with congestive heart failure. Electronically Signed   By: Richardean Sale M.D.   On: 05/24/2015 08:01       Medical Problem List and Plan: 1.  Right hemiparesis/aphasia  secondary to left MCA infarct secondary to left ICA /MCA occlusion status post revascularization with mechanical thrombectomy   -begin CIR therapies 2.  DVT Prophylaxis/Anticoagulation: SCDs  3. Pain Management/history of multiple back surgeries: Tylenol as needed   -pain appears controlled at present 4. Dysphagia. Dysphagia #2 thin liquids. Follow-up speech therapy and advance as appropriate 5. Neuropsych: This patient is  capable of making decisions on his  own behalf. 6. Skin/Wound Care: Routine skin checks  7. Fluids/Electrolytes/Nutrition: Routine I&O with follow-up chemistries upon admit 8. Atrial fibrillation/hypertropic cardiomyopathy/pacemaker. Follow-up per cardiology services. Heart rate controlled at present. Continue amiodarone 400 mg twice daily for 5 days then decrease to 200 mg twice daily and then after 2 weeks decrease to 200  mg daily 9. Hypertension. Lisinopril 20 mg daily, Toprol 50 mg daily. Monitor with increased mobility 10. Hyperlipidemia. Crestor 11. Appearance of labored breathing: family reports that he appears this way at baseline. Lungs sound clear and oxygen sats have been in the 90's. Pt denies any shortness of breath.    Post Admission Physician Evaluation: 1. Functional deficits secondary  to left MCA infarct. 2. Patient is admitted to receive collaborative, interdisciplinary care between the physiatrist, rehab nursing staff, and therapy team. 3. Patient's level of medical complexity and substantial therapy needs in context of that medical necessity cannot be provided at a lesser intensity of care such as a SNF. 4. Patient has experienced substantial functional loss from his/her baseline which was documented above under the "Functional History" and "Functional Status" headings.  Judging by the patient's diagnosis, physical exam, and functional history, the patient has potential for functional progress which will result in measurable gains while on inpatient rehab.  These gains will be of substantial and practical use upon discharge  in facilitating mobility and self-care at the household level. 5. Physiatrist will provide 24 hour management of medical needs as well as oversight of the therapy plan/treatment and provide guidance as appropriate regarding the interaction of the two. 6. 24 hour rehab nursing will assist with bladder management, bowel management, safety, skin/wound care, disease management, medication administration, pain management and patient education  and help integrate therapy concepts, techniques,education, etc. 7. PT will assess and treat for/with: Lower extremity strength, range of motion, stamina, balance, functional mobility, safety, adaptive techniques and equipment, NMR, cognitive-perceptual rx, stroke education, ego support and postiive reinforcement.   Goals are: mod I to  supervision. 8. OT will assess and treat for/with: ADL's, functional mobility, safety, upper extremity strength, adaptive techniques and equipment, NMR, cognitive perceptual rx, stroke education, community reintegration.   Goals are: mod I to supervision. Therapy may proceed with showering this  patient. 9. SLP will assess and treat for/with: cognition, language, swallowing, communication, language.  Goals are: mod I with swallowing to min assist with language. 10. Case Management and Social Worker will assess and treat for psychological issues and discharge planning. 11. Team conference will be held weekly to assess progress toward goals and to determine barriers to discharge. 12. Patient will receive at least 3 hours of therapy per day at least 5 days per week. 13. ELOS: 7-10 days       14. Prognosis:  excellent     Meredith Staggers, MD, [place] [facility] 05/24/2015   05/24/2015

## 2015-05-23 NOTE — Progress Notes (Signed)
  Echocardiogram 2D Echocardiogram has been performed.  Dorothey BasemanReel, Aman Batley M 05/23/2015, 10:07 AM

## 2015-05-23 NOTE — Evaluation (Signed)
Occupational Therapy Evaluation Patient Details Name: Luis SoloKenneth Robinson MRN: [id number] DOB: 03-30-1940 Today's Date: 05/23/2015    History of Present Illness pt presents with L MCA Infarct with L MCA and ICA REvascularization and L ICA Stent placed.  pt with hx of HTN, Cardiomyopathy, Back Surgery, Rotator Cuff Surgery, and Pacemaker inserted, replaced, and then removed.     Clinical Impression   Pt admitted with above. He demonstrates the below listed deficits and will benefit from continued OT to maximize safety and independence with BADLs.  Pt presents to OT with Rt hemiparesis, communication deficits, and impaired balance.  He requires min - mod A for ADLs.   He requires mod A to maneuver walker in small spaces and when turning to prevent falls.   He demonstrates DOE 3/4 on RA and with 02 sats 93% on RA and 97% on 2L.  Family is supportive.  Feel he would benefit from CIR to allow him to return home with supervision.       Follow Up Recommendations  CIR;Supervision/Assistance - 24 hour    Equipment Recommendations  3 in 1 bedside comode    Recommendations for Other Services Rehab consult     Precautions / Restrictions Precautions Precautions: Fall      Mobility Bed Mobility Overal bed mobility: Needs Assistance Bed Mobility: Supine to Sit     Supine to sit: Min guard        Transfers Overall transfer level: Needs assistance   Transfers: Sit to/from Stand;Stand Pivot Transfers Sit to Stand: Min assist Stand pivot transfers: Min assist       General transfer comment: Min A to steady as balance is impaired     Balance Overall balance assessment: Needs assistance Sitting-balance support: Feet supported Sitting balance-Leahy Scale: Good     Standing balance support: No upper extremity supported;During functional activity Standing balance-Leahy Scale: Poor Standing balance comment: requires min a                            ADL Overall ADL's :  Needs assistance/impaired Eating/Feeding: Set up;Sitting   Grooming: Wash/dry hands;Wash/dry face;Oral care;Brushing hair;Minimal assistance;Standing   Upper Body Bathing: Minimal assitance;Sitting   Lower Body Bathing: Sit to/from stand;Moderate assistance Lower Body Bathing Details (indicate cue type and reason): mod A for thoroughness  Upper Body Dressing : Minimal assistance;Sitting   Lower Body Dressing: Minimal assistance;Sit to/from stand Lower Body Dressing Details (indicate cue type and reason): able to don/doff socks  Toilet Transfer: Ambulation;Comfort height toilet;Grab bars;RW;Moderate assistance Toilet Transfer Details (indicate cue type and reason): mod A to maneuver in small locations  Toileting- Clothing Manipulation and Hygiene: Moderate assistance;Sit to/from stand Toileting - Clothing Manipulation Details (indicate cue type and reason): for thoroughness      Functional mobility during ADLs: Minimal assistance;Moderate assistance;Rolling walker General ADL Comments: Pt requries mod A to turn with walker and to negotiate small spaces such as the bathroom      Vision Vision Assessment?: Yes Eye Alignment: Within Functional Limits Ocular Range of Motion: Within Functional Limits Alignment/Gaze Preference: Within Defined Limits Tracking/Visual Pursuits: Able to track stimulus in all quads without difficulty Additional Comments: Pt unable to follow complex commands for visual field assessment.  He locates items in both Lt and Rt visual fields.  He was able to read clock on wall accurately (digital clock )   Perception Perception Perception Tested?: Yes Spatial deficits: no obvious inattention noted    Praxis Praxis  Praxis tested?: Within functional limits    Pertinent Vitals/Pain Pain Assessment: No/denies pain     Hand Dominance Right   Extremity/Trunk Assessment Upper Extremity Assessment Upper Extremity Assessment: RUE deficits/detail RUE Deficits /  Details: grossly 4/5 RUE Coordination: decreased fine motor   Lower Extremity Assessment Lower Extremity Assessment: Defer to PT evaluation   Cervical / Trunk Assessment Cervical / Trunk Assessment: Kyphotic   Communication Communication Communication: Expressive difficulties;Receptive difficulties   Cognition Arousal/Alertness: Awake/alert Behavior During Therapy: WFL for tasks assessed/performed Overall Cognitive Status: Difficult to assess                     General Comments       Exercises       Shoulder Instructions      Home Living Family/patient expects to be discharged to:: Inpatient rehab                                 Additional Comments: lives with son      Prior Functioning/Environment Level of Independence: Independent             OT Diagnosis: Generalized weakness;Hemiplegia dominant side;Other (comment) (communication deficits )   OT Problem List: Decreased strength;Decreased activity tolerance;Impaired balance (sitting and/or standing);Decreased coordination;Decreased safety awareness;Decreased knowledge of use of DME or AE;Cardiopulmonary status limiting activity;Impaired UE functional use   OT Treatment/Interventions: Self-care/ADL training;Therapeutic exercise;Neuromuscular education;DME and/or AE instruction;Energy conservation;Therapeutic activities;Patient/family education;Balance training    OT Goals(Current goals can be found in the care plan section) Acute Rehab OT Goals Patient Stated Goal: Pt did not state  OT Goal Formulation: With patient Time For Goal Achievement: 06/06/15 Potential to Achieve Goals: Good ADL Goals Pt Will Perform Grooming: with supervision;standing Pt Will Perform Upper Body Bathing: with supervision;sitting Pt Will Perform Lower Body Bathing: with supervision;sit to/from stand Pt Will Perform Upper Body Dressing: with supervision;sitting Pt Will Perform Lower Body Dressing: with  supervision;sit to/from stand Pt Will Transfer to Toilet: with supervision;ambulating;regular height toilet;bedside commode;grab bars Pt Will Perform Toileting - Clothing Manipulation and hygiene: with supervision;sit to/from stand  OT Frequency: Min 2X/week   Barriers to D/C:            Co-evaluation              End of Session Equipment Utilized During Treatment: Rolling walker;Gait belt Nurse Communication: Mobility status  Activity Tolerance: Patient tolerated treatment well Patient left: in chair;with call bell/phone within reach;with chair alarm set   Time: 1610-9604 OT Time Calculation (min): 38 min Charges:  OT General Charges $OT Visit: 1 Procedure OT Evaluation $OT Eval Moderate Complexity: 1 Procedure OT Treatments $Self Care/Home Management : 8-22 mins $Therapeutic Activity: 8-22 mins G-Codes:    Luis Robinson M 06/13/2015, 12:53 PM

## 2015-05-23 NOTE — Progress Notes (Signed)
Physical Therapy Treatment Patient Details Name: Luis SoloKenneth Orsini MRN: [id number] DOB: [birth year] Today's Date: 05/23/2015    History of Present Illness pt presents with L MCA Infarct with L MCA and ICA REvascularization and L ICA Stent placed.  pt with hx of HTN, Cardiomyopathy, Back Surgery, Rotator Cuff Surgery, and Pacemaker inserted, replaced, and then removed.      PT Comments    Patient very motivated. At times, pt doesn't understand verbal or visual cues; and other times he immediately follows the verbal command. Remains very unsteady with need for moderate assist at least 10 times during PT session. (Tending to lose balance posteriorly with very delayed balance reactions). Family present and report pt was able to walk grocery store distances with no device and mild dyspnea. Patient with incr respirations with effortful movements (supine to sit; sit to stand), however on room air SaO2 96-99%. RN made aware and OK with leaving pt on room air.   Follow Up Recommendations  CIR     Equipment Recommendations  Other (comment) (TBA at next venue)    Recommendations for Other Services Rehab consult     Precautions / Restrictions Precautions Precautions: Fall Restrictions Weight Bearing Restrictions: No    Mobility  Bed Mobility Overal bed mobility: Needs Assistance Bed Mobility: Supine to Sit;Sit to Supine     Supine to sit: Min assist Sit to supine: Min guard   General bed mobility comments: HOB flat, no rail; incr effort, time and with vc's  Transfers Overall transfer level: Needs assistance Equipment used: None Transfers: Sit to/from Stand Sit to Stand: Min assist;Mod assist Stand pivot transfers: Min assist       General transfer comment: pt with posterior bias; when pt has the back of his legs braced against the edge of bed, he required min assist to balance, However, when he does not use posterior thighs to "leverage" up he requires mod assist (some for powering  up to stand and mostly due to posterior bias); repeated x 4  Ambulation/Gait Ambulation/Gait assistance: Mod assist Ambulation Distance (Feet): 12 Feet Assistive device: 1 person hand held assist Gait Pattern/deviations: Step-through pattern;Decreased step length - left;Decreased stance time - right;Decreased dorsiflexion - right;Leaning posteriorly;Wide base of support   Gait velocity interpretation: Below normal speed for age/gender General Gait Details: Multiple cues for more upright posture; pt did not use a RW PTA; required mod assist when turning to his rt due to loss of balance   Stairs            Wheelchair Mobility    Modified Rankin (Stroke Patients Only) Modified Rankin (Stroke Patients Only) Pre-Morbid Rankin Score: No significant disability Modified Rankin: Moderately severe disability     Balance Overall balance assessment: Needs assistance Sitting-balance support: No upper extremity supported;Feet supported Sitting balance-Leahy Scale: Good     Standing balance support: No upper extremity supported Standing balance-Leahy Scale: Zero Standing balance comment: strong posterior bias repeatedly re-occurs as pt works through Consulting civil engineervarious balance challenges         Rhomberg - Eyes Opened:  (could not follow instructions or visual demonstration)   High level balance activites: Side stepping;Direction changes;Turns;Sudden stops High Level Balance Comments: pt with difficulty following most commands (verbal and sometimes visual).     Cognition Arousal/Alertness: Awake/alert Behavior During Therapy: WFL for tasks assessed/performed Overall Cognitive Status: Difficult to assess                      Exercises General Exercises -  Lower Extremity Hip Flexion/Marching: AAROM;Both;10 reps;Standing Toe Raises: AAROM;Left;5 reps;Standing;Limitations Toe Raises Limitations: Unable to lift rt toes off floor; delayed reaction when losing his balance and often  required assist Heel Raises: AAROM;Both;5 reps;Standing    General Comments       Pertinent Vitals/Pain Pain Assessment: No/denies pain    Home Living Family/patient expects to be discharged to:: Inpatient rehab               Additional Comments: lives with son    Prior Function Level of Independence: Independent          PT Goals (current goals can now be found in the care plan section) Acute Rehab PT Goals Patient Stated Goal: Per family for pt to be able to come home. Time For Goal Achievement: 06/05/15 Progress towards PT goals: Progressing toward goals    Frequency  Min 4X/week    PT Plan Current plan remains appropriate    Co-evaluation             End of Session Equipment Utilized During Treatment: Gait belt Activity Tolerance: Patient tolerated treatment well Patient left: with call bell/phone within reach;with family/visitor present;in bed;with bed alarm set     Time: 1610-9604 PT Time Calculation (min) (ACUTE ONLY): 33 min  Charges:  $Therapeutic Activity: 23-37 mins                    G Codes:      Chanson Teems 06-21-15, 3:49 PM Pager [phone]

## 2015-05-23 NOTE — Progress Notes (Signed)
Inpatient Rehabilitation  Met with patient, son, and daughter to discuss team's recommendation for IP Rehab post acute care stay.  Will initiate insurance authorization and follow along for medical readiness.  Please call with questions.  Carmelia Roller., CCC/SLP Admission Coordinator  [facility]  Cell [phone]

## 2015-05-23 NOTE — Care Management Important Message (Signed)
Important Message  Patient Details  Name: Luis Robinson MRN: [id number] Date of Birth: [birth year]   Medicare Important Message Given:  Yes    Luis Robinson Luis Robinson 05/23/2015, 11:40 AM

## 2015-05-23 NOTE — Progress Notes (Signed)
STROKE TEAM PROGRESS NOTE   SUBJECTIVE (INTERVAL HISTORY) Patient had foley taken out around 6a - this am with bleeding from penis this am, clots. Incontinent, soaked bed per nurse. comfortable now. His family is at the bedside. Bladder scan without urinary retention.    OBJECTIVE Temp:  [98.1 F (36.7 C)-100.6 F (38.1 C)] 98.6 F (37 C) (03/30 1035) Pulse Rate:  [56-84] 70 (03/30 1035) Cardiac Rhythm:  [-] Normal sinus rhythm (03/29 2000) Resp:  [18-118] 118 (03/30 1035) BP: (105-177)/(61-98) 131/71 mmHg (03/30 1035) SpO2:  [91 %-99 %] 97 % (03/30 1035) FiO2 (%):  [3 %] 3 % (03/30 1035)  CBC:   Recent Labs Lab 05/20/15 2115 05/21/15 0523 05/22/15 0214 05/23/15 0320  WBC 12.2* 13.0* 15.0* 17.4*  NEUTROABS 9.9* 11.2*  --   --   HGB 14.9 12.2* 12.0* 11.9*  HCT 45.7 37.9* 38.1* 36.5*  MCV 94.4 94.8 96.0 93.4  PLT 140* 115* 108* 107*    Basic Metabolic Panel:   Recent Labs Lab 05/22/15 0214 05/23/15 0320  NA 139 138  K 3.7 3.4*  CL 109 107  CO2 21* 23  GLUCOSE 111* 129*  BUN 11 11  CREATININE 0.86 0.89  CALCIUM 8.5* 8.7*    Lipid Panel:     Component Value Date/Time   CHOL 84 05/21/2015 0522   TRIG 59 05/21/2015 0522   HDL 29* 05/21/2015 0522   CHOLHDL 2.9 05/21/2015 0522   VLDL 12 05/21/2015 0522   LDLCALC 43 05/21/2015 0522   HgbA1c:  Lab Results  Component Value Date   HGBA1C 5.8* 05/21/2015   Urine Drug Screen:     Component Value Date/Time   LABOPIA NONE DETECTED 05/20/2015 2142   COCAINSCRNUR NONE DETECTED 05/20/2015 2142   LABBENZ NONE DETECTED 05/20/2015 2142   AMPHETMU NONE DETECTED 05/20/2015 2142   THCU NONE DETECTED 05/20/2015 2142   LABBARB NONE DETECTED 05/20/2015 2142      IMAGING I have personally reviewed the radiological images below and agree with the radiology interpretations.  Ct Angio Head and neck W/cm &/or Wo Cm  05/20/2015  IMPRESSION: 1. 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 Reconstitution intracranially but with  evidence of poor flow. 2. Left M1 MCA occlusion near its origin.   Ct Head Wo Contrast  05/21/2015  IMPRESSION: 1. Continued interval evolution of large anterior left MCA distribution infarct, with increased localized mass effect without midline shift. 2. New/increased scattered hyperdensity within the area of infarction, consistent with hemorrhage.   05/21/2015  IMPRESSION: 1. Scattered parenchymal hyperdensity within the anterior left MCA distribution, likely contrast staining from recent catheter directed arteriogram. Small amount of hemorrhage not excluded. Attention at follow-up recommended. 2. Otherwise stable head CT.  05/20/2015  IMPRESSION: 1. No acute intracranial pathology seen on CT. 2. Mild to moderate cortical volume loss and scattered small vessel ischemic microangiopathy.   Cerebral angio -  S/P Lt common carotid arteriogram,followed by complete revascularization of occluded LT MCA M1 seg with x1 pass with the Solitaire FR514mm x 40 mm retrieval device and a total of 2 mg of superselective IA Integrelin and complete revascularization of symptomatic LT ICA occlusion with stent assisted angioplasty and 6 mg of IA Integrelin  2D echo   - Left ventricle: The cavity size was normal. There was moderate concentric hypertrophy. Systolic function was normal. The estimated ejection fraction was in the range of 60% to 65%. Wall motion was normal; there were no regional wall motion abnormalities. Diastolic dysfunction with elevated LV  filling pressure. - Aortic valve: Mildly calcified leaflets. There was no stenosis. There was trivial regurgitation. - Mitral valve: Heavily calcified annulus. Mildly thickened leaflets . Mild stenosis. There was moderate regurgitation. - Left atrium: Severely dilated at 62 ml/m2. - Tricuspid valve: There was moderate regurgitation. - Pulmonary arteries: PA peak pressure: 54 mm Hg (S). - Systemic veins: The IVC measures >2.1 cm, but collapses more than 50%,  suggesting an RA pressure of 8 mmHG. Impressions:   LVEF 60-65%, moderate LVH with moderate to severe focal basal septal hypertrophy. There was no evidence for outflow tract obstruction. Normal wall motion, diastolic dysfunction wtih elevated LV filling pressure, heavy mitral annular calcification with mild MS and mild to moderate MR, severe LAE, moderate TR,  RVSP of 54 mmHg (moderate pulmonary hypertension), dilated IVC with estimated RA pressure of 8 mmHg.  Chest x-ray  05/22/2015 Interval extubation of the trachea and esophagus. Low-grade CHF superimposed on underlying COPD. Persistent left lower lobe atelectasis or pneumonia.   Physical exam  Temp:  [98.1 F (36.7 C)-100.6 F (38.1 C)] 98.6 F (37 C) (03/30 1035) Pulse Rate:  [56-84] 70 (03/30 1035) Resp:  [18-118] 118 (03/30 1035) BP: (105-177)/(61-98) 131/71 mmHg (03/30 1035) SpO2:  [91 %-99 %] 97 % (03/30 1035) FiO2 (%):  [3 %] 3 % (03/30 1035)  General - Well nourished, well developed, no acute distress.  Ophthalmologic - Fundi not visualized due to noncooperation.  Cardiovascular - NSR   Physical exam  Temp:  [98 F (36.7 C)-98.6 F (37 C)] 98.2 F (36.8 C) (03/30 2142) Pulse Rate:  [63-84] 70 (03/30 2142) Resp:  [18] 18 (03/30 2142) BP: (107-177)/(66-92) 141/75 mmHg (03/30 2142) SpO2:  [91 %-100 %] 93 % (03/30 2142)  General - Well nourished, well developed, in no apparent distress.  Ophthalmologic - Fundi not visualized due to noncooperation.  Cardiovascular - Regular rate and rhythm, not in afib.  Mental Status -  Awake alert, orientation to age, self, people, but not orientated to time. Language showed mild to moderate transcortical motor aphasia, able to repeat, naming 4/5. follows all simple commands.   Cranial Nerves II - XII - II - Visual field intact OU. III, IV, VI - Extraocular movements intact. V - Facial sensation intact bilaterally. VII - Facial movement intact bilaterally. VIII - Hearing &  vestibular intact bilaterally. X - Palate elevates symmetrically. XI - Chin turning & shoulder shrug intact bilaterally. XII - Tongue protrusion intact.  Motor Strength - The patient's strength was 4+/5 in all extremities and pronator drift was absent.  Bulk was normal and fasciculations were absent.   Motor Tone - Muscle tone was assessed at the neck and appendages and was normal.  Reflexes - The patient's reflexes were 1+ in all extremities and he had no pathological reflexes.  Sensory - Light touch, temperature/pinprick were assessed and were symmetrical.    Coordination - The patient had normal movements in the hands with no ataxia or dysmetria.  Tremor was absent.  Gait and Station - deferred to PT in room   ASSESSMENT/PLAN Luis Robinson is a 75 y.o. male with history of atrial fibrillation on anticoagulation presenting with right-sided weakness. He did not receive IV t-PA due to being on anticoagulation. He received complete revascularization of L M1 with mechanical thrombectomy and IA Integrilin; and complete revascularization of left ICA with stent-assisted angioplasty and IA Integrilin.  Stroke:  Dominant left MCA infarct secondary to left ICA/MCA occlusion, embolic secondary to known atrial fibrillation. s/p  complete revascularization with mechanical thrombectomy, stent-assisted angioplasty and IA Integrilin  Resultant  Aphasia, improving  MRI / MRA - pacemaker  CTA head and neck - left ICA and MCA occlusion  CT head repeat - left MCA infarct with expected evolution with localized mass effect, however new hemorrhagic transformation within infarct  2D Echo  EF 60-65%, no SOE  LDL 43  HgbA1c 5.8  SCDs for VTE prophylaxis DIET DYS 2 Room service appropriate?: Yes; Fluid consistency:: Thin  Eliquis (apixaban) daily prior to admission, now on aspirin 81 mg daily and clopidogrel 75 mg daily. Due to hemorrhagic transformation. Will repeat CT in 1 week, if hemorrhage  resolving, add eliquis to plavix and stop aspirin  Patient counseled to be compliant with his antithrombotic medications  Ongoing aggressive stroke risk factor management  Therapy recommendations:  CIR  Disposition:  Pending.   Paroxysmal atrial Fibrillation with RVR  Home anticoagulation:  Eliquis (apixaban) daily   RVR, uncontrolled. Started on amiodarone after failed beta blockers  Transition from IV to po amiodarone - 400 mg by mouth twice a day for continued load. After 5 days, decreased to 200 mg twice a day and then after 2 weeks decrease to 200 mg once a day.  Cardiology consult appreciated  afib RVR switched to NSR   Resume home meds   On aspirin and plavix now, plan eliquis once hemorrhage is resolving   Left ICA occlusion  S/p mechanical thrombectomy  S/p left ICA stent  On DAPT  Once resume eliquis, will continue plavix and d/c ASA. Plavix needs to be on for at least 3 months. Ideally 6 months.    BP management  BP stable  BP goal 120-140  Resume home meds  Hyperlipidemia  Home meds:  Crestor 20 mg daily  LDL 43, goal < 70  Resume Crestor   UTI  UA WBC TNTC  On rocephin  Urine culture pending, culture re-incubated  Repeat UA  Other Stroke Risk Factors  Advanced age  Cigarette smoker, advised to stop smoking  Hypertrophic cardiomyopathy  Other Active Problems  S/p pacer  Low-grade CHF  CXR - COPD, Left lower lobe atelectasis/pneumonia  Hematuria  Likely Traumatic post foley removal this am  Monitor  Leukocytosis 12.2-13.0-15.0-17.4 - CBC and CXR in am  Hospital day # 3  Marvel Plan, MD PhD Stroke Neurology 05/23/2015 11:11 PM    To contact Stroke Continuity provider, please refer to [email or web address]. After hours, contact General Neurology

## 2015-05-23 NOTE — Progress Notes (Signed)
Blood in foley tubing from pt pulling on foley.

## 2015-05-24 ENCOUNTER — Inpatient Hospital Stay (HOSPITAL_COMMUNITY): Payer: Medicare HMO

## 2015-05-24 ENCOUNTER — Inpatient Hospital Stay (HOSPITAL_COMMUNITY)
Admission: RE | Admit: 2015-05-24 | Discharge: 2015-06-05 | DRG: 057 | Disposition: A | Discharge status: Home Health | Payer: Medicare HMO | Source: Intra-hospital | Attending: Physical Medicine & Rehabilitation | Admitting: Physical Medicine & Rehabilitation

## 2015-05-24 DIAGNOSIS — Z7901 Long term (current) use of anticoagulants: Secondary | ICD-10-CM | POA: Diagnosis not present

## 2015-05-24 DIAGNOSIS — I2510 Atherosclerotic heart disease of native coronary artery without angina pectoris: Secondary | ICD-10-CM | POA: Diagnosis not present

## 2015-05-24 DIAGNOSIS — I69351 Hemiplegia and hemiparesis following cerebral infarction affecting right dominant side: Principal | ICD-10-CM

## 2015-05-24 DIAGNOSIS — Z950 Presence of cardiac pacemaker: Secondary | ICD-10-CM | POA: Diagnosis not present

## 2015-05-24 DIAGNOSIS — R159 Full incontinence of feces: Secondary | ICD-10-CM | POA: Insufficient documentation

## 2015-05-24 DIAGNOSIS — I63512 Cerebral infarction due to unspecified occlusion or stenosis of left middle cerebral artery: Secondary | ICD-10-CM | POA: Diagnosis present

## 2015-05-24 DIAGNOSIS — I422 Other hypertrophic cardiomyopathy: Secondary | ICD-10-CM | POA: Diagnosis not present

## 2015-05-24 DIAGNOSIS — G8191 Hemiplegia, unspecified affecting right dominant side: Secondary | ICD-10-CM

## 2015-05-24 DIAGNOSIS — M21371 Foot drop, right foot: Secondary | ICD-10-CM

## 2015-05-24 DIAGNOSIS — F17210 Nicotine dependence, cigarettes, uncomplicated: Secondary | ICD-10-CM

## 2015-05-24 DIAGNOSIS — I10 Essential (primary) hypertension: Secondary | ICD-10-CM | POA: Diagnosis not present

## 2015-05-24 DIAGNOSIS — R1310 Dysphagia, unspecified: Secondary | ICD-10-CM | POA: Diagnosis not present

## 2015-05-24 DIAGNOSIS — I69398 Other sequelae of cerebral infarction: Secondary | ICD-10-CM | POA: Diagnosis not present

## 2015-05-24 DIAGNOSIS — I639 Cerebral infarction, unspecified: Secondary | ICD-10-CM | POA: Diagnosis not present

## 2015-05-24 DIAGNOSIS — R030 Elevated blood-pressure reading, without diagnosis of hypertension: Secondary | ICD-10-CM | POA: Diagnosis not present

## 2015-05-24 DIAGNOSIS — IMO0002 Reserved for concepts with insufficient information to code with codable children: Secondary | ICD-10-CM

## 2015-05-24 DIAGNOSIS — J811 Chronic pulmonary edema: Secondary | ICD-10-CM

## 2015-05-24 DIAGNOSIS — D62 Acute posthemorrhagic anemia: Secondary | ICD-10-CM | POA: Diagnosis not present

## 2015-05-24 DIAGNOSIS — I63312 Cerebral infarction due to thrombosis of left middle cerebral artery: Secondary | ICD-10-CM | POA: Diagnosis not present

## 2015-05-24 DIAGNOSIS — R269 Unspecified abnormalities of gait and mobility: Secondary | ICD-10-CM | POA: Diagnosis not present

## 2015-05-24 DIAGNOSIS — I69391 Dysphagia following cerebral infarction: Secondary | ICD-10-CM

## 2015-05-24 DIAGNOSIS — I480 Paroxysmal atrial fibrillation: Secondary | ICD-10-CM | POA: Diagnosis not present

## 2015-05-24 DIAGNOSIS — I69320 Aphasia following cerebral infarction: Secondary | ICD-10-CM

## 2015-05-24 DIAGNOSIS — E785 Hyperlipidemia, unspecified: Secondary | ICD-10-CM | POA: Diagnosis not present

## 2015-05-24 DIAGNOSIS — R32 Unspecified urinary incontinence: Secondary | ICD-10-CM | POA: Diagnosis not present

## 2015-05-24 DIAGNOSIS — Z79899 Other long term (current) drug therapy: Secondary | ICD-10-CM | POA: Diagnosis not present

## 2015-05-24 DIAGNOSIS — Z955 Presence of coronary angioplasty implant and graft: Secondary | ICD-10-CM | POA: Diagnosis not present

## 2015-05-24 DIAGNOSIS — I619 Nontraumatic intracerebral hemorrhage, unspecified: Secondary | ICD-10-CM

## 2015-05-24 DIAGNOSIS — R0989 Other specified symptoms and signs involving the circulatory and respiratory systems: Secondary | ICD-10-CM | POA: Insufficient documentation

## 2015-05-24 DIAGNOSIS — Z8673 Personal history of transient ischemic attack (TIA), and cerebral infarction without residual deficits: Secondary | ICD-10-CM

## 2015-05-24 HISTORY — DX: Cerebral infarction due to unspecified occlusion or stenosis of left middle cerebral artery: I63.512

## 2015-05-24 LAB — BASIC METABOLIC PANEL
Anion gap: 9 (ref 5–15)
BUN: 11 mg/dL (ref 6–20)
CALCIUM: 8.6 mg/dL — AB (ref 8.9–10.3)
CO2: 24 mmol/L (ref 22–32)
CREATININE: 0.92 mg/dL (ref 0.61–1.24)
Chloride: 104 mmol/L (ref 101–111)
GFR calc non Af Amer: >60 mL/min (ref >60)
GLUCOSE: 114 mg/dL — AB (ref 65–99)
Potassium: 3.6 mmol/L (ref 3.5–5.1)
Sodium: 137 mmol/L (ref 135–145)

## 2015-05-24 LAB — CBC
HEMATOCRIT: 35.4 % — AB (ref 39.0–52.0)
Hemoglobin: 12 g/dL — ABNORMAL LOW (ref 13.0–17.0)
MCH: 31.3 pg (ref 26.0–34.0)
MCHC: 33.9 g/dL (ref 30.0–36.0)
MCV: 92.4 fL (ref 78.0–100.0)
Platelets: 117 10*3/uL — ABNORMAL LOW (ref 150–400)
RBC: 3.83 MIL/uL — ABNORMAL LOW (ref 4.22–5.81)
RDW: 13.9 % (ref 11.5–15.5)
WBC: 17.8 10*3/uL — ABNORMAL HIGH (ref 4.0–10.5)

## 2015-05-24 LAB — URINALYSIS W MICROSCOPIC (NOT AT ARMC)
BILIRUBIN URINE: NEGATIVE
GLUCOSE, UA: NEGATIVE mg/dL
KETONES UR: 40 mg/dL — AB
Nitrite: NEGATIVE
PH: 5.5 (ref 5.0–8.0)
Protein, ur: 100 mg/dL — AB
SPECIFIC GRAVITY, URINE: 1.018 (ref 1.005–1.030)

## 2015-05-24 MED ORDER — FUROSEMIDE 10 MG/ML IJ SOLN
40.0000 mg | Freq: Once | INTRAMUSCULAR | Status: AC
  Administered 2015-05-24: 40 mg via INTRAVENOUS
  Filled 2015-05-24: qty 4

## 2015-05-24 MED ORDER — PANTOPRAZOLE SODIUM 40 MG PO TBEC
40.0000 mg | DELAYED_RELEASE_TABLET | Freq: Every day | ORAL | Status: DC
  Administered 2015-05-25 – 2015-06-05 (×12): 40 mg via ORAL
  Filled 2015-05-24 (×12): qty 1

## 2015-05-24 MED ORDER — AMIODARONE HCL 200 MG PO TABS
400.0000 mg | ORAL_TABLET | Freq: Two times a day (BID) | ORAL | Status: DC
  Administered 2015-05-24 – 2015-05-27 (×7): 400 mg via ORAL
  Filled 2015-05-24 (×7): qty 2

## 2015-05-24 MED ORDER — LISINOPRIL 20 MG PO TABS
20.0000 mg | ORAL_TABLET | Freq: Every day | ORAL | Status: DC
  Administered 2015-05-25: 20 mg via ORAL
  Filled 2015-05-24 (×3): qty 1

## 2015-05-24 MED ORDER — ACETAMINOPHEN 325 MG PO TABS
650.0000 mg | ORAL_TABLET | Freq: Four times a day (QID) | ORAL | Status: DC | PRN
  Administered 2015-05-31: 650 mg via ORAL
  Filled 2015-05-24: qty 2

## 2015-05-24 MED ORDER — SORBITOL 70 % SOLN: 30.0000 mL | Freq: Every day | Status: DC | PRN

## 2015-05-24 MED ORDER — ASPIRIN EC 81 MG PO TBEC
81.0000 mg | DELAYED_RELEASE_TABLET | Freq: Every day | ORAL | Status: DC
  Administered 2015-05-25: 81 mg via ORAL
  Filled 2015-05-24: qty 1

## 2015-05-24 MED ORDER — CLOPIDOGREL BISULFATE 75 MG PO TABS
75.0000 mg | ORAL_TABLET | Freq: Every day | ORAL | Status: DC
  Administered 2015-05-25 – 2015-06-05 (×12): 75 mg via ORAL
  Filled 2015-05-24 (×12): qty 1

## 2015-05-24 MED ORDER — ROSUVASTATIN CALCIUM 20 MG PO TABS
20.0000 mg | ORAL_TABLET | Freq: Every day | ORAL | Status: DC
  Administered 2015-05-25 – 2015-06-05 (×12): 20 mg via ORAL
  Filled 2015-05-24 (×12): qty 1

## 2015-05-24 MED ORDER — ONDANSETRON HCL 4 MG/2ML IJ SOLN: 4.0000 mg | Freq: Four times a day (QID) | INTRAMUSCULAR | Status: DC | PRN

## 2015-05-24 MED ORDER — METOPROLOL SUCCINATE ER 50 MG PO TB24
50.0000 mg | ORAL_TABLET | Freq: Every day | ORAL | Status: DC
  Administered 2015-05-25: 50 mg via ORAL
  Filled 2015-05-24: qty 1

## 2015-05-24 MED ORDER — ALBUTEROL SULFATE (2.5 MG/3ML) 0.083% IN NEBU: 2.5000 mg | INHALATION_SOLUTION | Freq: Four times a day (QID) | RESPIRATORY_TRACT | Status: DC | PRN

## 2015-05-24 MED ORDER — ASPIRIN EC 81 MG PO TBEC
81.0000 mg | DELAYED_RELEASE_TABLET | Freq: Every day | ORAL | Status: DC
  Administered 2015-05-24: 81 mg via ORAL
  Filled 2015-05-24: qty 1

## 2015-05-24 MED ORDER — ONDANSETRON HCL 4 MG PO TABS
4.0000 mg | ORAL_TABLET | Freq: Four times a day (QID) | ORAL | Status: DC | PRN
Start: 2015-05-24 — End: 2015-06-05

## 2015-05-24 NOTE — Progress Notes (Signed)
Will transferred to RM 1. Family at bedside.

## 2015-05-24 NOTE — Discharge Summary (Signed)
Stroke Discharge Summary  Patient ID: Luis Robinson   MRN: [id number]      DOB: 1941/02/01  Date of Admission: 05/20/2015 Date of Discharge: 05/24/2015  Attending Physician:  Rosalin Hawking, MD, Stroke MD Consulting Physician(s):  Treatment Team:  Md Stroke, MD cardiology and rehabilitation medicine Dr. Marlou Porch and Dr. Posey Pronto Patient's PCP:  No primary care provider on file.  Discharge Diagnoses: Left no cerebral artery infarct with mechanical thrombectomy and stent assisted angioplasty as well as intra-arterial Integrelin. Principal Problem:   CVA due to occlusion of cerebral artery- treated with LMCA stent 05/20/15 Active Problems:   Respiratory failure with hypoxia (HCC)   Tobacco abuse   Paroxysmal atrial fibrillation (HCC)   Benign essential HTN   Hypertrophic obstructive cardiomyopathy (HCC)   Thrombocytopenia (HCC)   SSS (sick sinus syndrome) ([place])   Cardiac pacemaker in situ-MDT- implanted 2008   Chronic anticoagulation-Eliquis prior to adm   Atrial fibrillation with RVR ([place])   CAD- S/P Dx1 DES Aug 2016   Dyslipidemia BMI  Body mass index is 26.01 kg/(m^2).  Past Medical History  Diagnosis Date  . Hypertrophic cardiomyopathy ([place])   . Hyperlipidemia   . Hypertension   . Tobacco use disorder   . Dyspnea   . Dizziness   . PAF (paroxysmal atrial fibrillation) ([place])   . SSS (sick sinus syndrome) ([place])   . CAD S/P percutaneous coronary angioplasty Aug 2016    Dx1 DES, no other significant dis  . Chronic anticoagulation     Eliquis   Past Surgical History  Procedure Laterality Date  . Back surgery x 2    . Rotator cuff surgery    . Insert / replace / remove pacemaker  2008    MDT  . Radiology with anesthesia N/A 05/20/2015    Procedure: RADIOLOGY WITH ANESTHESIA;  Surgeon: Medication Radiologist, MD;  Location: [place];  Service: Radiology;  Laterality: N/A;  . Ablation  Nov 2016    [facility]    Medications to be continued on Rehab . amiodarone  400 mg Oral BID  .  aspirin EC  81 mg Oral Daily  . chlorhexidine gluconate (SAGE KIT)  15 mL Mouth Rinse BID  . clopidogrel  300 mg Oral Once  . clopidogrel  75 mg Oral Q breakfast  . feeding supplement (ENSURE ENLIVE)  237 mL Oral BID BM  . lisinopril  20 mg Oral Daily  . metoprolol succinate  50 mg Oral Daily  . pantoprazole  40 mg Oral Daily  . rosuvastatin  20 mg Oral Daily    LABORATORY STUDIES CBC    Component Value Date/Time   WBC 17.8* 05/24/2015 0547   RBC 3.83* 05/24/2015 0547   HGB 12.0* 05/24/2015 0547   HCT 35.4* 05/24/2015 0547   PLT 117* 05/24/2015 0547   MCV 92.4 05/24/2015 0547   MCH 31.3 05/24/2015 0547   MCHC 33.9 05/24/2015 0547   RDW 13.9 05/24/2015 0547   LYMPHSABS 1.0 05/21/2015 0523   MONOABS 0.6 05/21/2015 0523   EOSABS 0.1 05/21/2015 0523   BASOSABS 0.0 05/21/2015 0523   CMP    Component Value Date/Time   NA 137 05/24/2015 0547   K 3.6 05/24/2015 0547   CL 104 05/24/2015 0547   CO2 24 05/24/2015 0547   GLUCOSE 114* 05/24/2015 0547   BUN 11 05/24/2015 0547   CREATININE 0.92 05/24/2015 0547   CALCIUM 8.6* 05/24/2015 0547   PROT 5.9* 05/20/2015 2115   ALBUMIN 3.6 05/20/2015 2115  AST 32 05/20/2015 2115   ALT 22 05/20/2015 2115   ALKPHOS 49 05/20/2015 2115   BILITOT 0.6 05/20/2015 2115   GFRNONAA >60 05/24/2015 0547   GFRAA >60 05/24/2015 0547   COAGS Lab Results  Component Value Date   INR 1.21 05/20/2015   Lipid Panel    Component Value Date/Time   CHOL 84 05/21/2015 0522   TRIG 59 05/21/2015 0522   HDL 29* 05/21/2015 0522   CHOLHDL 2.9 05/21/2015 0522   VLDL 12 05/21/2015 0522   LDLCALC 43 05/21/2015 0522   HgbA1C  Lab Results  Component Value Date   HGBA1C 5.8* 05/21/2015   Cardiac Panel (last 3 results) No results for input(s): CKTOTAL, CKMB, TROPONINI, RELINDX in the last 72 hours. Urinalysis    Component Value Date/Time   COLORURINE AMBER* 05/24/2015 0202   APPEARANCEUR HAZY* 05/24/2015 0202   LABSPEC 1.018 05/24/2015 0202    PHURINE 5.5 05/24/2015 0202   GLUCOSEU NEGATIVE 05/24/2015 0202   HGBUR LARGE* 05/24/2015 0202   BILIRUBINUR NEGATIVE 05/24/2015 0202   KETONESUR 40* 05/24/2015 0202   PROTEINUR 100* 05/24/2015 0202   NITRITE NEGATIVE 05/24/2015 0202   LEUKOCYTESUR SMALL* 05/24/2015 0202   Urine Drug Screen     Component Value Date/Time   LABOPIA NONE DETECTED 05/20/2015 2142   COCAINSCRNUR NONE DETECTED 05/20/2015 2142   LABBENZ NONE DETECTED 05/20/2015 2142   AMPHETMU NONE DETECTED 05/20/2015 2142   THCU NONE DETECTED 05/20/2015 2142   LABBARB NONE DETECTED 05/20/2015 2142    Alcohol Level No results found for: ETH   SIGNIFICANT DIAGNOSTIC STUDIES  Ct Angio Head and neck W/cm &/or Wo Cm  05/20/2015 IMPRESSION: 1. 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 Reconstitution intracranially but with evidence of poor flow. 2. Left M1 MCA occlusion near its origin.   Ct Head Wo Contrast  05/21/2015 IMPRESSION: 1. Continued interval evolution of large anterior left MCA distribution infarct, with increased localized mass effect without midline shift. 2. New/increased scattered hyperdensity within the area of infarction, consistent with hemorrhage.   05/21/2015 IMPRESSION: 1. Scattered parenchymal hyperdensity within the anterior left MCA distribution, likely contrast staining from recent catheter directed arteriogram. Small amount of hemorrhage not excluded. Attention at follow-up recommended. 2. Otherwise stable head CT.  05/20/2015 IMPRESSION: 1. No acute intracranial pathology seen on CT. 2. Mild to moderate cortical volume loss and scattered small vessel ischemic microangiopathy.   Cerebral angio -  S/P Lt common carotid arteriogram,followed by complete revascularization of occluded LT MCA M1 seg with x1 pass with the Solitaire FR46m x 40 mm retrieval device and a total of 2 mg of superselective IA Integrelin and complete revascularization of symptomatic LT ICA occlusion with stent assisted angioplasty  and 6 mg of IA Integrelin  2D echo   - Left ventricle: The cavity size was normal. There was moderate concentric hypertrophy. Systolic function was normal. The estimated ejection fraction was in the range of 60% to 65%. Wall motion was normal; there were no regional wall motion abnormalities. Diastolic dysfunction with elevated LV filling pressure. - Aortic valve: Mildly calcified leaflets. There was no stenosis. There was trivial regurgitation. - Mitral valve: Heavily calcified annulus. Mildly thickened leaflets . Mild stenosis. There was moderate regurgitation. - Left atrium: Severely dilated at 62 ml/m2. - Tricuspid valve: There was moderate regurgitation. - Pulmonary arteries: PA peak pressure: 54 mm Hg (S). - Systemic veins: The IVC measures >2.1 cm, but collapses more than 50%, suggesting an RA pressure of 8 mmHG. Impressions: LVEF 60-65%,  moderate LVH with moderate to severe focal basal septal hypertrophy. There was no evidence for outflow tract obstruction. Normal wall motion, diastolic dysfunction wtih elevated LV filling pressure, heavy mitral annular calcification with mild MS and mild to moderate MR, severe LAE, moderate TR, RVSP of 54 mmHg (moderate pulmonary hypertension), dilated IVC with estimated RA pressure of 8 mmHg.  Chest x-ray  05/22/2015 Interval extubation of the trachea and esophagus. Low-grade CHF superimposed on underlying COPD. Persistent left lower lobe atelectasis or pneumonia.  Portable chest x-ray 1 view 05/24/2015 Cardiomegaly with new pulmonary edema consistent with congestive heart failure.     HISTORY OF PRESENT ILLNES Luis Robinson is a 75 y.o. male with a history of cardiomyopathy as well as "history of ablation." who presents with new onset right-sided weakness and aphasia occurring in the setting of atrial fibrillation. He has a history of "a flutter in his heart". His son was with him at onset, left the room for a minute and heard something fall  and on his return found him to be weak on the right side with difficulty speaking.  LKW: 7:30 PM tpa given?: no, on anticoagulation - Eliquis Premorbid modified rankin scale: 1  HOSPITAL COURSE [name] is a 75 y.o. male with history of atrial fibrillation on anticoagulation presenting with right-sided weakness. He did not receive IV t-PA due to being on anticoagulation. He received complete revascularization of L M1 with mechanical thrombectomy and IA Integrilin; and complete revascularization of left ICA with stent-assisted angioplasty and IA Integrilin. He was admitted to the neuro intensive care unit for monitoring, further evaluation and treatment.  Stroke: Dominant left MCA infarct secondary to left ICA/MCA occlusion, embolic secondary to known atrial fibrillation. s/p complete revascularization with mechanical thrombectomy, stent-assisted angioplasty and IA Integrilin  Resultant Aphasia, improving  MRI / MRA - pacemaker  CTA head and neck - left ICA and MCA occlusion  CT head repeat - left MCA infarct with expected evolution with localized mass effect, however new hemorrhagic transformation within infarct  2D Echo EF 60-65%, no SOE  LDL 43  HgbA1c 5.8  SCDs for VTE prophylaxis  DIET DYS 2 Room service appropriate?: Yes; Fluid consistency:: Thin  Eliquis (apixaban) daily prior to admission, now on aspirin 81 mg daily and clopidogrel 75 mg daily. Due to hemorrhagic transformation. Will repeat CT in 1 week, if hemorrhage resolving, add eliquis to plavix and stop aspirin  Patient counseled to be compliant with his antithrombotic medications  Ongoing aggressive stroke risk factor management  Therapy recommendations: CIR  Disposition: Discharged to inpatient rehabilitation.  Paroxysmal atrial Fibrillation with RVR  Home anticoagulation: Eliquis (apixaban) daily   RVR, uncontrolled. Started on amiodarone after failed beta blockers  Transition from IV to  po amiodarone - 400 mg by mouth twice a day for continued load. After 5 days, decreased to 200 mg twice a day and then after 2 weeks decrease to 200 mg once a day.  Cardiology consult appreciated  afib RVR switched to NSR   Resume home meds   On aspirin and plavix now, plan eliquis once hemorrhage is resolving  Left ICA occlusion  S/p mechanical thrombectomy  S/p left ICA stent  On DAPT  Once resume eliquis, will continue plavix and d/c ASA. Plavix needs to be on for at least 3 months. Ideally 6 months.  BP management  BP stable  BP goal 120-140  Resume home meds  Hyperlipidemia  Home meds: Crestor 20 mg daily  LDL 43, goal <  70  Resume Crestor  UTI  UA WBC TNTC  On rocephin  Urine culture 05/20/2015 greater than 100,000 colonies of Klebsiella pneumoniae. IV Rocephin x 3 dose.   Repeat UA 05/24/2015 - WBC 6-30  Other Stroke Risk Factors  Advanced age  Cigarette smoker, advised to stop smoking  Hypertrophic cardiomyopathy  Other Active Problems  S/p pacer  CHF - CXR 05/24/2015 - Cardiomegaly with new pulmonary edema consistent with congestive heart failure.  CXR - COPD, Left lower lobe atelectasis/pneumonia  Hematuria  Likely Traumatic post foley removal this am  Monitor  Leukocytosis 12.2-13.0-15.0-17.4-17.8   DISCHARGE EXAM Blood pressure 130/86, pulse 70, temperature 96.8 F (36 C), temperature source Oral, resp. rate 18, height 6' (1.829 m), weight 87 kg (191 lb 12.8 oz), SpO2 93 %.  General - Well nourished, well developed, in no apparent distress.  Ophthalmologic - Fundi not visualized due to noncooperation.  Cardiovascular - Regular rate and rhythm, not in afib.  Mental Status -  Awake alert, orientation to age, self, people, but not orientated to time. Language showed mild to moderate transcortical motor aphasia, able to repeat, naming 4/5. follows all simple commands.   Cranial Nerves II - XII - II -  Visual field intact OU. III, IV, VI - Extraocular movements intact. V - Facial sensation intact bilaterally. VII - Facial movement intact bilaterally. VIII - Hearing & vestibular intact bilaterally. X - Palate elevates symmetrically. XI - Chin turning & shoulder shrug intact bilaterally. XII - Tongue protrusion intact.  Motor Strength - The patient's strength was 4+/5 in all extremities and pronator drift was absent. Bulk was normal and fasciculations were absent.  Motor Tone - Muscle tone was assessed at the neck and appendages and was normal.  Reflexes - The patient's reflexes were 1+ in all extremities and he had no pathological reflexes.  Sensory - Light touch, temperature/pinprick were assessed and were symmetrical.   Coordination - The patient had normal movements in the hands with no ataxia or dysmetria. Tremor was absent.  Gait and Station - deferred to PT in room Discharge Diet  DIET DYS 2 Room service appropriate?: Yes; Fluid consistency:: Thin liquids  DISCHARGE PLAN  Disposition:  Transfer to [place] for ongoing PT, OT and ST  aspirin 81 mg daily and clopidogrel 75 mg daily for secondary stroke prevention.  Recommend ongoing risk factor control by Primary Care Physician at time of discharge from inpatient rehabilitation.  Follow-up No primary care provider on file. in 2 weeks following discharge from rehab.  Follow-up with Dr. Rosalin Hawking in the Stroke Clinic in 2 months.   40 minutes were spent preparing discharge.  Mikey Bussing PA-C [facility] Pager [phone] 05/24/2015, 3:13 PM  I, the attending vascular neurologist, have personally obtained a history, examined the patient, evaluated laboratory data, individually viewed imaging studies and agree with radiology interpretations. Together with the NP/PA, we formulated the assessment and plan of care which reflects our mutual decision.  I have made any additions or  clarifications directly to the above note and agree with the findings and plan as currently documented.    Rosalin Hawking, MD PhD Stroke Neurology 05/24/2015 10:07 PM

## 2015-05-24 NOTE — Progress Notes (Signed)
Inpatient Rehabilitation  Received insurance authorization for IP Rehab admission today. Called children to notify, with no answer. We await clarification on when to start Eliquis and MD clearance.  Please call with questions.       Charlane FerrettiMelissa Isaah Furry, M.A., CCC/SLP Admission Coordinator  [facility]  Cell [phone]

## 2015-05-24 NOTE — Care Management Note (Signed)
Case Management Note  Patient Details  Name: Luis Robinson MRN: [id number] Date of Birth: 11/26/1940  Subjective/Objective:                    Action/Plan: Patient is discharging to CIR today. No further needs per CM.   Expected Discharge Date:                  Expected Discharge Plan:  IP Rehab Facility  In-House Referral:     Discharge planning Services  CM Consult  Post Acute Care Choice:    Choice offered to:     DME Arranged:    DME Agency:     HH Arranged:    HH Agency:     Status of Service:  Completed, signed off  Medicare Important Message Given:  Yes Date Medicare IM Given:    Medicare IM give by:    Date Additional Medicare IM Given:    Additional Medicare Important Message give by:     If discussed at Long Length of Stay Meetings, dates discussed:    Additional Comments:  Kermit BaloKelli F Lesbia Ottaway, RN 05/24/2015, 11:40 AM

## 2015-05-24 NOTE — Progress Notes (Signed)
Patient admitted to rehab at 1945. Family at bedside, explained rehab process. Patient alert, but with expressive aphasia, so difficult to assess orientation. Bedalarm in use. Family reports patient does not call for assistance. At 2210, appeared SOB with audible wheeze. O2 sat 91% RA. Paged Dr. Wynn BankerKirsteins , new orders received. Will continue to monitor. Alfredo MartinezMurray, Bader Stubblefield A

## 2015-05-24 NOTE — Progress Notes (Signed)
Inpatient Rehabilitation  Received medical clearance to admit patient to IP Rehab today.  Family in agreement.  Plan for Rehab this afternoon.  Please call with questions.  Charlane FerrettiMelissa Keithan Dileonardo, M.A., CCC/SLP Admission Coordinator  [facility]  Cell [phone]

## 2015-05-24 NOTE — Progress Notes (Signed)
A follow-up chest x-ray ordered this morning while on acute per Dr.Xu of neurology services showed cardiomegaly with new pulmonary edema consistent with congestive heart failure. Patient is being admitted today to inpatient rehabilitation services. Patient well known to cardiology services will consult in regards to latest chest x-ray findings question need for diuresis. Continue amiodarone for now.

## 2015-05-24 NOTE — Progress Notes (Signed)
Speech Language Pathology Treatment: Dysphagia  Patient Details Name: Luis Robinson MRN: [id number] DOB: Jul 08, 1940 Today's Date: 05/24/2015 Time: [id number] SLP Time Calculation (min) (ACUTE ONLY): 10 min  Assessment / Plan / Recommendation Clinical Impression  Patient seen to address readiness for diet texture upgrade beyond current (Dys 2, Thin liquids), during lunch meal.  Patient with severe expressive aphasia, but did not seem to be bothered by this texture and when SLP asked him if he liked it, he responded, "yeah". Patient had tray of Dys 2 and clinician gave him a graham cracker to observed how he tolerated that. Patient exhibited prolonged mastication and oral transit of hard solid but no overt s/s of aspiration. He did exhibit more rapid and short nose breathing pattern during mastication of hard solids, but did not appear to be in significant distress. Plan is for patient to discharge to Inpatient Rehab today. Recommend continue with current diet consistency with trial trays of upgrade solids with SLP after he has transitioned to CIR unit.   HPI HPI: Luis SoloKenneth Froh is a 75 y.o. male with PMH as outlined below. [facility] due to sudden onset right sided weakness and aphasia. Symptoms began suddenly that evening and son initially thought pt was having difficult time swallowing; however, turns out he wasn't able to speak. Son was with him but had left the room and upon leaving, thought he heard something fall. When he returned, he noticed the weakness and aphasia. he had CTA of teh brain which revealed acute left MCA infarct (cannot have MRI). In IR, he had complete revascularization of occluded left MCA and left ICA as well as stent placement to left ICA. Intubated from 3/27 to 3/28.       SLP Plan  Continue with current plan of care     Recommendations  Diet recommendations: Dysphagia 2 (fine chop);Thin liquid Supervision: Patient able to self feed;Intermittent  supervision to cue for compensatory strategies Compensations: Slow rate;Small sips/bites;Lingual sweep for clearance of pocketing;Minimize environmental distractions Postural Changes and/or Swallow Maneuvers: Seated upright 90 degrees             Oral Care Recommendations: Oral care BID Follow up Recommendations: Inpatient Rehab Plan: Continue with current plan of care     GO                Pablo Lawrencereston, Kynsli Haapala Tarrell 05/24/2015, 2:02 PM  Angela NevinJohn T. Estevan Kersh, MA, CCC-SLP 05/24/2015 2:08 PM

## 2015-05-24 NOTE — Progress Notes (Signed)
Physical Therapy Treatment Patient Details Name: Luis Robinson MRN: [id number] DOB: [birth year] Today's Date: 05/24/2015    History of Present Illness Pt presents with L MCA Infarct with L MCA and ICA REvascularization and L ICA Stent placed.  pt with hx of HTN, Cardiomyopathy, Back Surgery, Rotator Cuff Surgery, and Pacemaker inserted, replaced, and then removed.      PT Comments    Pt assisted with ambulation and presents with balance deficits.  Also observed decreased R ankle clearance for swing during gait which occasionally disrupted pt's balance requiring assist to steady.  Plan is for pt to d/c to CIR hopefully today.     Follow Up Recommendations  CIR     Equipment Recommendations  Other (comment) (TBA next venue)    Recommendations for Other Services       Precautions / Restrictions Precautions Precautions: Fall    Mobility  Bed Mobility Overal bed mobility: Needs Assistance Bed Mobility: Supine to Sit     Supine to sit: Min guard     General bed mobility comments: pt up in recliner on arrival  Transfers Overall transfer level: Needs assistance Equipment used: Rolling walker (2 wheeled) Transfers: Sit to/from Stand Sit to Stand: Min assist;Mod assist Stand pivot transfers: Mod assist       General transfer comment: pt continues with posterior bias; when pt has the back of his legs braced against the edge of bed, he required min assist to balance, visual and verbal cues for hand placement  Ambulation/Gait Ambulation/Gait assistance: Mod assist Ambulation Distance (Feet): 120 Feet Assistive device: Rolling walker (2 wheeled) Gait Pattern/deviations: Step-through pattern;Trunk flexed;Decreased stride length     General Gait Details: Multiple cues for more upright posture and remaining closer to RW and min assist for steadying; pt did not use a RW PTA; mod assist for balance with last 30 feet due to use of hand rail (no RW) for more challenge to balance,  pt with poor R ankle DF during swing (also less R active DF then L when pt asked to tap toes in sitting)   Stairs            Wheelchair Mobility    Modified Rankin (Stroke Patients Only) Modified Rankin (Stroke Patients Only) Pre-Morbid Rankin Score: No significant disability Modified Rankin: Moderately severe disability     Balance Overall balance assessment: Needs assistance Sitting-balance support: Feet supported Sitting balance-Leahy Scale: Good     Standing balance support: During functional activity Standing balance-Leahy Scale: Poor Standing balance comment: min-mod assist for balance during gait, requires UE support                    Cognition Arousal/Alertness: Awake/alert Behavior During Therapy: WFL for tasks assessed/performed Overall Cognitive Status: Difficult to assess Area of Impairment: Orientation;Attention;Problem solving;Safety/judgement;Following commands Orientation Level: Disoriented to;Place;Time;Situation Current Attention Level: Sustained   Following Commands: Follows one step commands inconsistently Safety/Judgement: Decreased awareness of safety   Problem Solving: Slow processing;Requires verbal cues;Difficulty sequencing;Requires tactile cues General Comments: Pt required mod verbal cues to sequence through familiar grooming tasks     Exercises General Exercises - Lower Extremity Long Arc Quad: AROM;Both;10 reps;Seated Hip ABduction/ADduction: AROM;Right;10 reps;Standing;Other (comment) (all standing exercises performed with BIL UE support) Hip Flexion/Marching: AROM;Right;10 reps;Seated Heel Raises: AROM;Standing;Both;10 reps    General Comments        Pertinent Vitals/Pain Pain Assessment: No/denies pain    Home Living  Prior Function            PT Goals (current goals can now be found in the care plan section) Progress towards PT goals: Progressing toward goals    Frequency  Min  4X/week    PT Plan Current plan remains appropriate    Co-evaluation             End of Session Equipment Utilized During Treatment: Gait belt Activity Tolerance: Patient tolerated treatment well Patient left: with call bell/phone within reach;in chair;with chair alarm set     Time: 1136-1155 PT Time Calculation (min) (ACUTE ONLY): 19 min  Charges:  $Gait Training: 8-22 mins                    G Codes:      Vaun Hyndman,KATHrine E Jun 17, 2015, 12:21 PM Zenovia Jarred, PT, DPT 06/17/2015 Pager: [phone]

## 2015-05-24 NOTE — Progress Notes (Signed)
Luis Karis Juba, Luis Robinson Physician Signed Physical Medicine and Rehabilitation Consult Note 05/22/2015 11:56 AM  Related encounter: ED to Hosp-Admission (Current) from 05/20/2015 in [facility] 89[facility]    Expand All Collapse All        Physical Medicine and Rehabilitation Consult  Reason for Consult: Right sided weakness  Referring Physician: Dr. Roda Shutters   HPI: Luis Robinson is a 75 y.o. male with history of HCM, ablation, HTN, back surgery with antalgic gait who was admitted on 05/20/15 after fall with acute onset of right sided weakness with inability to speak. He was found ot have A fib with RVR and IV tPA not given as patient had taken his dose of eliquis already. He underwent cerebral angio with complete revascularization of occluded L-MCA with single pass Solitaire and IA tPA and complete revascularization of L-ICA occlusion with IV tPA /stent assisted angioplasty. He tolerated extubation post procedure and has had improvement in right sided weakness. Patient with resultant right inattention, right foot drop, kyphosis, severe paraphrasia's and perseveration affecting cognition/comprehension. CIR recommended for follow up therapy.   Review of Systems  Unable to perform ROS: medical condition    Past Medical History  Diagnosis Date  . Hypertrophic cardiomyopathy (HCC)   . Hyperlipidemia   . Hypertension   . Tobacco use disorder   . Dyspnea   . Dizziness     Past Surgical History  Procedure Laterality Date  . Back surgery x 2    . Rotator cuff surgery    . Insert / replace / remove pacemaker  2008  . Radiology with anesthesia N/A 05/20/2015    Procedure: RADIOLOGY WITH ANESTHESIA; Surgeon: Medication Radiologist, Luis Robinson; Location: [facility] OR; Service: Radiology; Laterality: N/A;    Family History  Problem Relation Age of Onset  . Cancer Sister 2    Social History: Per reports that he has been  smoking Cigarettes. Per reports has been smoking about 0.50 packs per day. He has never used smokeless tobacco. Per reports that he does not drink alcohol or use illicit drugs.    Allergies  Allergen Reactions  . Bee Venom Anaphylaxis    Medications Prior to Admission  Medication Sig Dispense Refill  . apixaban (ELIQUIS) 5 MG TABS tablet Take 2.5 mg by mouth 2 (two) times daily.    Marland Kitchen lisinopril (PRINIVIL,ZESTRIL) 2.5 MG tablet Take 2.5 mg by mouth daily.    . metoprolol (TOPROL-XL) 50 MG 24 hr tablet Take 25 mg by mouth daily.     . rosuvastatin (CRESTOR) 20 MG tablet Take 20 mg by mouth daily.       Home: Home Living Family/patient expects to be discharged to:: Inpatient rehab  Functional History: Prior Function Level of Independence: Independent Functional Status:  Mobility: Bed Mobility Overal bed mobility: Needs Assistance Bed Mobility: Supine to Sit Supine to sit: Min guard, HOB elevated General bed mobility comments: pt did have HOB elevated after SLP performed bedside swallow, but with increased time and effort was able to come to sitting with close guarding and A for lines and covers.  Transfers Overall transfer level: Needs assistance Equipment used: Rolling walker (2 wheeled) Transfers: Sit to/from Stand Sit to Stand: Min assist General transfer comment: Cues for UE use and A more for balance than power up to standing.  Ambulation/Gait Ambulation/Gait assistance: Min assist Ambulation Distance (Feet): 100 Feet Assistive device: Rolling walker (2 wheeled) Gait Pattern/deviations: Step-through pattern, Decreased step length - right, Decreased stance time - right, Decreased stride  length, Decreased dorsiflexion - right, Trunk flexed General Gait Details: Multiple cues for more upright posture and positioning within RW. pt tends to drift to R side and with max cueing pt is able to attend to obstacles on R side, but not without  cues. Foot drop noted on R side and mildly antalgic gait. but family indicates that antalgic gait is normal for him, but foot drop is not.     ADL:    Cognition: Cognition Overall Cognitive Status: Difficult to assess Arousal/Alertness: Awake/alert Orientation Level: Oriented to person, Oriented to place Attention: Focused, Sustained Focused Attention: Appears intact Sustained Attention: Appears intact Behaviors: Perseveration Cognition Arousal/Alertness: Awake/alert Behavior During Therapy: WFL for tasks assessed/performed Overall Cognitive Status: Difficult to assess Difficult to assess due to: Impaired communication   Blood pressure 117/79, pulse 77, temperature 99.5 F (37.5 C), temperature source Core (Comment), resp. rate 23, height 6' (1.829 m), weight 87 kg (191 lb 12.8 oz), SpO2 93 %. Physical Exam  Nursing note and vitals reviewed. Constitutional: He appears well-developed and well-nourished.  HENT:  Head: Normocephalic and atraumatic.  Mouth/Throat: Oropharynx is clear and moist.  Eyes: Conjunctivae and EOM are normal. Pupils are equal, round, and reactive to light.  Neck: Normal range of motion. Neck supple.  Cardiovascular: Normal rate and regular rhythm.  Respiratory: Effort normal and breath sounds normal. No respiratory distress. He has no wheezes.  GI: Soft. Bowel sounds are normal.  Musculoskeletal: He exhibits no edema or tenderness.  Neurological: He is alert.  Expressive > Receptive aphasia.  Unable to consistently follow simple motor commands. RUE resting tremors and RLE weakness noted.  Right facial weakness. Motor (limited due to aphasia): RUE: 4+-5/5 proximal to distal LUE: 5/5 proximal to distal LLE: 5/5 proximal to distal RLE: 4+-5 hip flexion, knee extension, 0/5 ankle dorsi/plantar flexion  Skin: Skin is warm and dry.  Psychiatric: He has a normal mood and affect. His behavior is normal.     Lab Results Last 24 Hours    Results for  orders placed or performed during the hospital encounter of 05/20/15 (from the past 24 hour(s))  I-STAT 3, arterial blood gas (G3+) Status: Abnormal   Collection Time: 05/21/15 5:34 PM  Result Value Ref Range   pH, Arterial 7.335 (L) 7.350 - 7.450   pCO2 arterial 40.9 35.0 - 45.0 mmHg   pO2, Arterial 129.0 (H) 80.0 - 100.0 mmHg   Bicarbonate 21.6 20.0 - 24.0 mEq/L   TCO2 23 0 - 100 mmol/L   O2 Saturation 99.0 %   Acid-base deficit 4.0 (H) 0.0 - 2.0 mmol/L   Patient temperature 37.7 C    Collection site RADIAL, ALLEN'S TEST ACCEPTABLE    Drawn by RT    Sample type ARTERIAL   Basic metabolic panel Status: Abnormal   Collection Time: 05/22/15 2:14 AM  Result Value Ref Range   Sodium 139 135 - 145 mmol/L   Potassium 3.7 3.5 - 5.1 mmol/L   Chloride 109 101 - 111 mmol/L   CO2 21 (L) 22 - 32 mmol/L   Glucose, Bld 111 (H) 65 - 99 mg/dL   BUN 11 6 - 20 mg/dL   Creatinine, Ser 1.61 0.61 - 1.24 mg/dL   Calcium 8.5 (L) 8.9 - 10.3 mg/dL   GFR calc non Af Amer >60 >60 mL/min   GFR calc Af Amer >60 >60 mL/min   Anion gap 9 5 - 15  CBC Status: Abnormal   Collection Time: 05/22/15 2:14 AM  Result Value  Ref Range   WBC 15.0 (H) 4.0 - 10.5 K/uL   RBC 3.97 (L) 4.22 - 5.81 MIL/uL   Hemoglobin 12.0 (L) 13.0 - 17.0 g/dL   HCT 16.1 (L) 09.6 - 04.5 %   MCV 96.0 78.0 - 100.0 fL   MCH 30.2 26.0 - 34.0 pg   MCHC 31.5 30.0 - 36.0 g/dL   RDW 40.9 81.1 - 91.4 %   Platelets 108 (L) 150 - 400 K/uL      Imaging Results (Last 48 hours)    Ct Angio Head W/cm &/or Wo Cm  05/20/2015 CLINICAL DATA: Right-sided weakness and aphasia. EXAM: CT ANGIOGRAPHY HEAD AND NECK TECHNIQUE: Multidetector CT imaging of the head and neck was performed using the standard protocol during bolus administration of intravenous contrast. Multiplanar CT image reconstructions  and MIPs were obtained to evaluate the vascular anatomy. Carotid stenosis measurements (when applicable) are obtained utilizing NASCET criteria, using the distal internal carotid diameter as the denominator. CONTRAST: 50mL OMNIPAQUE IOHEXOL 350 MG/ML SOLN COMPARISON: None. FINDINGS: CTA NECK Aortic arch: Normal variant 4 vessel aortic arch with the left vertebral artery arising directly from the arch. Moderate aortic arch atherosclerosis. Mild non stenotic plaque involving the brachiocephalic and right subclavian arteries. Moderate calcified plaque in the proximal left subclavian artery resulting in less than 50% narrowing. Right carotid system: Moderate calcified plaque at the carotid bifurcation results in approximately 50% proximal ICA stenosis. There is also severe proximal ECA stenosis. Left carotid system: Common carotid artery is patent without significant stenosis. There is extensive calcified and noncalcified plaque at the carotid bifurcation, and the ICA is occluded at its origin without evidence of reconstitution in the neck. Vertebral arteries: The vertebral arteries are patent with the right being dominant. No significant right vertebral artery stenosis is seen. There is likely mild left vertebral artery origin stenosis. Skeleton: Mild multilevel cervical disc degeneration. Other neck: Partially visualized coronary artery atherosclerosis. Scattered venous air likely related to recent venipuncture. CTA HEAD Anterior circulation: Intracranial right ICA is patent with mild calcified plaque but no significant stenosis. The left ICA reconstitutes at the horizontal petrous segment but is irregular and poorly opacified distal to this with suspected moderate to severe focal stenosis in the anterior cavernous segment. The ICA terminus is patent, however there is occlusion of the proximal left M1 segment near its origin. Minimal collateral flow is present in distal MCA branch vessels. The right MCA is patent  without evidence of significant proximal stenosis or major branch occlusion. The left A1 segment is hypoplastic. Right A1 segment is widely patent. A2 and more distal ACA branches demonstrate mild irregularity. No intracranial aneurysm is identified. Posterior circulation: Intracranial vertebral arteries are widely patent to the vertebrobasilar junction with the right being dominant. Right PICA origin is patent. Left PICA is not well seen. AICAs are also not well seen. SCA origins are patent. Basilar artery is patent without stenosis. There is a tiny right posterior communicating artery. PCAs are patent with mild branch vessel irregularity but no significant proximal stenosis. Venous sinuses: Not well evaluated due to arterial phase contrast timing. IMPRESSION: 1. 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 Reconstitution intracranially but with evidence of poor flow. 2. Left M1 MCA occlusion near its origin. Critical Value/emergent results were called by telephone at the time of interpretation on 05/20/2015 at 9:55 pm to Dr. Ritta Slot , who verbally acknowledged these results. Electronically Signed By: Sebastian Ache M.D. On: 05/20/2015 22:13   Ct Head Wo Contrast  05/21/2015 CLINICAL  DATA: 75 year old hypertensive male with right-sided weakness. Post intervention yesterday. Subsequent encounter. EXAM: CT HEAD WITHOUT CONTRAST TECHNIQUE: Contiguous axial images were obtained from the base of the skull through the vertex without intravenous contrast. COMPARISON: 05/21/2015 1:25 a.m. head CT. 05/20/2015. FINDINGS: Interval partial clearing of hyperdense material within the frontal lobe which may represent clearing of injected contrast or partial clearing of intracranial hemorrhage. Evolution of what appears to be a large left middle cerebral artery distribution infarct. Mild mass effect upon the left lateral ventricle. Prominent small vessel disease changes. No intracranial mass lesion noted on this  unenhanced exam. Global atrophy without hydrocephalus. Mild exophthalmos. IMPRESSION: Interval partial clearing of hyperdense material within the frontal lobe which may represent clearing of injected contrast or partial clearing of intracranial hemorrhage. Evolution of what appears to be a large left middle cerebral artery distribution infarct. Mild mass effect upon the left lateral ventricle. Prominent small vessel disease changes. Electronically Signed By: Lacy Duverney M.D. On: 05/21/2015 13:13   Ct Head Wo Contrast  05/21/2015 CLINICAL DATA: Initial evaluation status post left common carotid arteriogram. EXAM: CT HEAD WITHOUT CONTRAST TECHNIQUE: Contiguous axial images were obtained from the base of the skull through the vertex without intravenous contrast. COMPARISON: Prior studies from 05/20/2015 FINDINGS: Age-related cerebral atrophy with chronic microvascular ischemic disease again noted. There is new patchy hyperdensity involving the anterior left operculum and insular region, likely contrast staining from recent arteriogram. Small amount of hemorrhage 9 entirely excluded. Involvement of the left lentiform nucleus present. No other acute intracranial hemorrhage. No other acute large vessel territory infarct. No extra-axial fluid collection. No hydrocephalus. Scalp soft tissues within normal limits. No acute abnormality about the orbits. Mild mucosal thickening within the ethmoidal air cells. Paranasal sinuses are otherwise clear. No mastoid effusion. Calvarium intact. IMPRESSION: 1. Scattered parenchymal hyperdensity within the anterior left MCA distribution, likely contrast staining from recent catheter directed arteriogram. Small amount of hemorrhage not excluded. Attention at follow-up recommended. 2. Otherwise stable head CT. Results were called by telephone at the time of interpretation on 05/21/2015 at 1:57 am to Dr. Amada Jupiter, who verbally acknowledged these results. Electronically  Signed By: Rise Mu M.D. On: 05/21/2015 02:04   Ct Head Wo Contrast  05/20/2015 CLINICAL DATA: Code stroke. Right-sided paralysis and aphasia, acute onset. Initial encounter. EXAM: CT HEAD WITHOUT CONTRAST TECHNIQUE: Contiguous axial images were obtained from the base of the skull through the vertex without intravenous contrast. COMPARISON: None. FINDINGS: There is no evidence of acute infarction, mass lesion, or intra- or extra-axial hemorrhage on CT. Prominence of ventricles and sulci reflects mild to moderate cortical volume loss. Mild cerebellar atrophy is noted. Scattered periventricular and subcortical white matter change likely reflects small vessel ischemic microangiopathy. The brainstem and fourth ventricle are within normal limits. The basal ganglia are unremarkable in appearance. The cerebral hemispheres demonstrate grossly normal gray-white differentiation. No mass effect or midline shift is seen. There is no evidence of fracture; visualized osseous structures are unremarkable in appearance. The visualized portions of the orbits are within normal limits. The paranasal sinuses and mastoid air cells are well-aerated. No significant soft tissue abnormalities are seen. IMPRESSION: 1. No acute intracranial pathology seen on CT. 2. Mild to moderate cortical volume loss and scattered small vessel ischemic microangiopathy. These results were called by telephone at the time of interpretation on 05/20/2015 at 9:27 pm to Dr. Amada Jupiter, who verbally acknowledged these results. Electronically Signed By: Roanna Raider M.D. On: 05/20/2015 21:27   Ct Angio Neck W/cm &/  or Wo/cm  05/20/2015 CLINICAL DATA: Right-sided weakness and aphasia. EXAM: CT ANGIOGRAPHY HEAD AND NECK TECHNIQUE: Multidetector CT imaging of the head and neck was performed using the standard protocol during bolus administration of intravenous contrast. Multiplanar CT image reconstructions and MIPs were obtained to  evaluate the vascular anatomy. Carotid stenosis measurements (when applicable) are obtained utilizing NASCET criteria, using the distal internal carotid diameter as the denominator. CONTRAST: 50mL OMNIPAQUE IOHEXOL 350 MG/ML SOLN COMPARISON: None. FINDINGS: CTA NECK Aortic arch: Normal variant 4 vessel aortic arch with the left vertebral artery arising directly from the arch. Moderate aortic arch atherosclerosis. Mild non stenotic plaque involving the brachiocephalic and right subclavian arteries. Moderate calcified plaque in the proximal left subclavian artery resulting in less than 50% narrowing. Right carotid system: Moderate calcified plaque at the carotid bifurcation results in approximately 50% proximal ICA stenosis. There is also severe proximal ECA stenosis. Left carotid system: Common carotid artery is patent without significant stenosis. There is extensive calcified and noncalcified plaque at the carotid bifurcation, and the ICA is occluded at its origin without evidence of reconstitution in the neck. Vertebral arteries: The vertebral arteries are patent with the right being dominant. No significant right vertebral artery stenosis is seen. There is likely mild left vertebral artery origin stenosis. Skeleton: Mild multilevel cervical disc degeneration. Other neck: Partially visualized coronary artery atherosclerosis. Scattered venous air likely related to recent venipuncture. CTA HEAD Anterior circulation: Intracranial right ICA is patent with mild calcified plaque but no significant stenosis. The left ICA reconstitutes at the horizontal petrous segment but is irregular and poorly opacified distal to this with suspected moderate to severe focal stenosis in the anterior cavernous segment. The ICA terminus is patent, however there is occlusion of the proximal left M1 segment near its origin. Minimal collateral flow is present in distal MCA branch vessels. The right MCA is patent without evidence of  significant proximal stenosis or major branch occlusion. The left A1 segment is hypoplastic. Right A1 segment is widely patent. A2 and more distal ACA branches demonstrate mild irregularity. No intracranial aneurysm is identified. Posterior circulation: Intracranial vertebral arteries are widely patent to the vertebrobasilar junction with the right being dominant. Right PICA origin is patent. Left PICA is not well seen. AICAs are also not well seen. SCA origins are patent. Basilar artery is patent without stenosis. There is a tiny right posterior communicating artery. PCAs are patent with mild branch vessel irregularity but no significant proximal stenosis. Venous sinuses: Not well evaluated due to arterial phase contrast timing. IMPRESSION: 1. 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 Reconstitution intracranially but with evidence of poor flow. 2. Left M1 MCA occlusion near its origin. Critical Value/emergent results were called by telephone at the time of interpretation on 05/20/2015 at 9:55 pm to Dr. Ritta Slot , who verbally acknowledged these results. Electronically Signed By: Sebastian Ache M.D. On: 05/20/2015 22:13   Portable Chest Xray  05/22/2015 CLINICAL DATA: Respiratory failure, hypoxia EXAM: PORTABLE CHEST 1 VIEW COMPARISON: Portable chest x-ray of May 21, 2015 FINDINGS: There has been interval extubation of the trachea and of the esophagus. The lungs are adequately inflated. The interstitial markings are mildly prominent though stable. The retrocardiac region on the left remains dense. The cardiac silhouette remains enlarged. The central pulmonary vascularity remains mildly engorged. The permanent pacemaker is in stable position. The bony thorax exhibits no acute abnormality. IMPRESSION: Interval extubation of the trachea and esophagus. Low-grade CHF superimposed on underlying COPD. Persistent left lower lobe atelectasis or pneumonia.  Electronically Signed By: David Swaziland M.D. On:  05/22/2015 07:19   Dg Chest Port 1 View  05/21/2015 CLINICAL DATA: Endotracheal tube placement. Initial encounter. EXAM: PORTABLE CHEST 1 VIEW COMPARISON: None. FINDINGS: The patient's endotracheal tube is seen ending 4-5 cm above the carina. An enteric tube is noted extending below diaphragm. The lungs are well-aerated. Vascular congestion is noted, with minimal bilateral atelectasis. There is no evidence of pleural effusion or pneumothorax. The cardiomediastinal silhouette is borderline normal in size. A pacemaker is noted overlying the left chest wall, with leads ending overlying the right atrium and right ventricle. No acute osseous abnormalities are seen. IMPRESSION: 1. Endotracheal tube seen ending 4-5 cm above the carina. 2. Vascular congestion, with minimal bilateral atelectasis. Electronically Signed By: Roanna Raider M.D. On: 05/21/2015 02:29   Dg Abd Portable 1v  05/21/2015 CLINICAL DATA: Orogastric tube placement. Initial encounter. EXAM: PORTABLE ABDOMEN - 1 VIEW COMPARISON: None. FINDINGS: The patient's enteric tube is noted ending overlying the body of the stomach. Its side-port is noted about the fundus of the stomach. The visualized bowel gas pattern is grossly unremarkable. Contrast is noted within the right renal collecting system. No free intra-abdominal air is seen, though evaluation for free air is limited on a single supine view. No acute osseous abnormalities are identified. Pacemaker leads are partially imaged, with a left chest wall pacemaker seen. The visualized portions of the lungs are grossly clear. IMPRESSION: Enteric tube noted ending overlying the body of the stomach. Electronically Signed By: Roanna Raider M.D. On: 05/21/2015 02:30     Assessment/Plan: Diagnosis: L-MCA infarct Labs and images independently reviewed. Records reviewed and summated above. Stroke: Continue secondary stroke prophylaxis and Risk Factor Modification listed below:   Antiplatelet therapy:  Blood Pressure Management: Continue current medication with prn's with permisive HTN per primary team Statin Agent:  Pre-diabetes management:  Tobacco abuse: Counsel when appropriate ? Right sided hemiparesis  1. Does the need for close, 24 hr/day medical supervision in concert with the patient's rehab needs make it unreasonable for this patient to be served in a less intensive setting? Yes 12. Co-Morbidities requiring supervision/potential complications: tobacco abuse (counsel when appropriate), A fib with RVR (monitor HR with increased physical activity), HCM (avoid UE resistance excercises), ablation, HTN (monitor and provide prns in accordance with increased physical exertion and pain), back surgery, ABLA (transfuse if necessary to ensure appropriate perfusion for increased activity tolerance), tachypnea (monitor RR and O2 Sats with increased physical exertion), leukocytosis (cont to monitor for signs and symptoms of infection, further workup if indicated), Thrombocytopenia (< 60,000/mm3 no resistive exercise), Pre-diabetes (Monitor in accordance with exercise and adjust meds as necessary) 2. Due to bladder management, safety, disease management, medication administration, pain management and patient education, does the patient require 24 hr/day rehab nursing? Yes 3. Does the patient require coordinated care of a physician, rehab nurse, PT (1-2 hrs/day, 5 days/week), OT (1-2 hrs/day, 5 days/week) and SLP (1-2 hrs/day, 5 days/week) to address physical and functional deficits in the context of the above medical diagnosis(es)? Yes Addressing deficits in the following areas: balance, endurance, locomotion, strength, transferring, bowel/bladder control, bathing, feeding, toileting, cognition, speech, language and swallowing 4. Can the patient actively participate in an intensive therapy program of at least 3 hrs of therapy per day at least 5 days per week? Yes 5. The  potential for patient to make measurable gains while on inpatient rehab is excellent 6. Anticipated functional outcomes upon discharge from inpatient rehab are supervision with PT, modified independent  and supervision with OT, modified independent and supervision with SLP. 7. Estimated rehab length of stay to reach the above functional goals is: 16-19 day s. 8. Does the patient have adequate social supports and living environment to accommodate these discharge functional goals? Yes 9. Anticipated D/C setting: Home 10. Anticipated post D/C treatments: HH therapy and Home excercise program 11. Overall Rehab/Functional Prognosis: good  RECOMMENDATIONS: This patient's condition is appropriate for continued rehabilitative care in the following setting: CIR once medically stable Patient has agreed to participate in recommended program. Potentially Note that insurance prior authorization may be required for reimbursement for recommended care.  Comment: Rehab Admissions Coordinator to follow up.  Luis MorrowAnkit Patel, Luis Robinson 05/22/2015       Revision History     Date/Time User Provider Type Action   05/22/2015 3:40 PM Luis Karis JubaAnil Patel, Luis Robinson Physician Sign   05/22/2015 12:59 PM Jacquelynn CreePamela S Love, PA-C Physician Assistant Share   View Details Report

## 2015-05-24 NOTE — PMR Pre-admission (Signed)
PMR Admission Coordinator Pre-Admission Assessment  Patient: Luis Robinson is an 75 y.o., male MRN: [id number] DOB: 1940-07-05 Height: 6' (182.9 cm) Weight: 87 kg (191 lb 12.8 oz)              Insurance Information HMO:     PPO: X     PCP:      IPA:      80/20:      OTHER:  PRIMARY: [employer] Medicare      Policy#: [id number]      Subscriber: Self CM Name: Maylon Peppers     Phone#: [phone] [phone]      Fax#: [phone] Pre-Cert#: [id number]      Employer: Retired Benefits:  Phone #: [phone]     Name: Lynnette Caffey. Date: 02/24/15     Deduct: 0      Out of Pocket Max: $4950.00      Life Max: N/A CIR: $295.00 days 1-6, Max $1770.00      SNF: $0 (days1-20) $164.00 (days 21-100) Outpatient: OT/PT/SLP     Co-Pay: $40 Home Health: OT/PT/SLP      Co-Pay: $0 DME: 80%     Co-Pay: 20% Providers: in network   Medicaid Application Date:       Case Manager:  Disability Application Date:       Case Worker:   Emergency Contact Information Contact Information    Name Relation Home Work Mobile   [place] Son [phone]     [place] Daughter   [phone]     Current Medical History  Patient Admitting Diagnosis: L-MCA infarct  History of Present Illness: Luis Robinson is a 75 y.o. male with history of Hypertrophic cardiomyopathy/PAF with ablation/pacemaker maintained on Eliquis and followed by Dr. Bettina Gavia at [facility], HTN, tobacco abuse, back surgery 2 with antalgic gait who was admitted on 05/20/15 after fall with acute onset of right sided weakness with inability to speak. 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 Mild mass effect upon the left lateral ventricle. He was found ot have A fib with RVR and IV tPA not given as patient had taken his dose of eliquis already. He underwent cerebral angio with complete revascularization of occluded L-MCA with single pass Solitaire and IA tPA and complete revascularization of L-ICA  occlusion with IV tPA /stent assisted angioplasty. He tolerated extubation post procedure and has had improvement in right sided weakness. Patient with resultant right inattention, right foot drop, kyphosis, severe paraphrasia's and perseveration affecting cognition/comprehension. Neurology consulted with follow-up and currently maintained on aspirin and Plavix for CVA prophylaxis..Plan to repeat CT of the head in 1 week if hemorrhage resolving resume Eliquis to Plavix and stop aspirin at that time. Cardiology service is consulted in regards to history of atrial fibrillation currently maintained on amiodarone 400 mg twice daily with rate control and planned taper to 200 mg twice daily after 5 days then after 2 weeks decreased to 200 mg daily. Maintain on a dysphagia #2 thin liquid diet CIR recommended for follow up therapy. Patient was admitted for comprehensive rehabilitation program.  NIH Total: 3    Past Medical History  Past Medical History  Diagnosis Date  . Hypertrophic cardiomyopathy ([place])   . Hyperlipidemia   . Hypertension   . Tobacco use disorder   . Dyspnea   . Dizziness   . PAF (paroxysmal atrial fibrillation) ([place])   . SSS (sick sinus syndrome) ([place])   . CAD S/P percutaneous coronary angioplasty Aug 2016  Dx1 DES, no other significant dis  . Chronic anticoagulation     Eliquis    Family History  family history includes Cancer (age of onset: 89) in his sister.  Prior Rehab/Hospitalizations:  Has the patient had major surgery during 100 days prior to admission? No; however, had pacemaker placed in November   Current Medications   Current facility-administered medications:  .  acetaminophen (TYLENOL) tablet 650 mg, 650 mg, Oral, Q6H PRN **OR** [DISCONTINUED] acetaminophen (TYLENOL) suppository 650 mg, 650 mg, Rectal, Q6H PRN, Luanne Bras, MD .  amiodarone (PACERONE) tablet 400 mg, 400 mg, Oral, BID, Rosalin Hawking, MD, 400 mg at 05/24/15 0843 .  chlorhexidine gluconate  (SAGE KIT) (PERIDEX) 0.12 % solution 15 mL, 15 mL, Mouth Rinse, BID, Greta Doom, MD, 15 mL at 05/23/15 2011 .  clopidogrel (PLAVIX) tablet 300 mg, 300 mg, Oral, Once, Luanne Bras, MD .  clopidogrel (PLAVIX) tablet 75 mg, 75 mg, Oral, Q breakfast, Luanne Bras, MD, 75 mg at 05/24/15 0844 .  feeding supplement (ENSURE ENLIVE) (ENSURE ENLIVE) liquid 237 mL, 237 mL, Oral, BID BM, Rosalin Hawking, MD, 237 mL at 05/24/15 0845 .  lisinopril (PRINIVIL,ZESTRIL) tablet 20 mg, 20 mg, Oral, Daily, Rosalin Hawking, MD, 20 mg at 05/24/15 0844 .  metoprolol succinate (TOPROL-XL) 24 hr tablet 50 mg, 50 mg, Oral, Daily, Rosalin Hawking, MD, 50 mg at 05/24/15 0843 .  ondansetron (ZOFRAN) injection 4 mg, 4 mg, Intravenous, Q6H PRN, Luanne Bras, MD .  pantoprazole (PROTONIX) EC tablet 40 mg, 40 mg, Oral, Daily, Rosalin Hawking, MD, 40 mg at 05/24/15 0843 .  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  Out daily visiting, running errands, walking and exercising.  Home Assistive Devices / Equipment    Prior Device Use: Indicate devices/aids used by the patient prior to current illness, exacerbation or injury? None of the above  Prior Functional Level Prior Function Level of Independence: Independent  Self Care: Did the patient need help bathing, dressing, using the toilet or eating?  Independent  Indoor Mobility: Did the patient need assistance with walking from room to room (with or without device)? Independent  Stairs: Did the patient  need assistance with internal or external stairs (with or without device)? Independent  Functional Cognition: Did the patient need help planning regular tasks such as shopping or remembering to take medications? Independent  Current Functional Level Cognition  Arousal/Alertness: Awake/alert Overall Cognitive Status: Difficult to assess Difficult to assess due to: Impaired communication Current Attention Level: Sustained Orientation Level: Oriented to person, Oriented to time Following Commands: Follows one step commands inconsistently Safety/Judgement: Decreased awareness of safety General Comments: Pt required mod verbal cues to sequence through familiar grooming tasks  Attention: Focused, Sustained Focused Attention: Appears intact Sustained Attention: Appears intact Behaviors: Perseveration    Extremity Assessment (includes Sensation/Coordination)  Upper Extremity Assessment: RUE deficits/detail RUE Deficits / Details: grossly 4/5 RUE Coordination: decreased fine motor  Lower Extremity Assessment: Defer to PT evaluation RLE Deficits / Details: Generally weak ~4/5.  Difficult to assess sensation as pt  perseverative and with expressive deficits.  Pain sensation appears intact.  Decreased coordination and ataxic movements.   RLE Sensation: decreased light touch RLE Coordination: decreased fine motor, decreased gross motor    ADLs  Overall ADL's : Needs assistance/impaired Eating/Feeding: Set up, Sitting Grooming: Wash/dry hands, Wash/dry face, Oral care, Standing, Minimal assistance Grooming Details (indicate cue type and reason): Pt requires min A for balance.  He required mod cues to sequence how to brush teeth.  He initially did not apply toothpaste to toothbrush, then proceeded to brush teeth.  Required therapist intervention to appley toothpaste.  He then required cues to spit and rinse mouth.  Pt also with difficulty sequencing washing face.  He applied soap to dry washcloth  and proceeded to wash face, then made no attempt to rinse soap off face - mod cues provided.  Upper Body Bathing: Minimal assitance, Sitting Lower Body Bathing: Moderate assistance, Sit to/from stand Lower Body Bathing Details (indicate cue type and reason): Assist for thoroughness and sequencing  Upper Body Dressing : Minimal assistance, Sitting Lower Body Dressing: Minimal assistance, Sit to/from stand Lower Body Dressing Details (indicate cue type and reason): able to don/doff socks  Toilet Transfer: Ambulation, Comfort height toilet, Grab bars, RW, Moderate assistance Toilet Transfer Details (indicate cue type and reason): mod A to maneuver in small locations  Toileting- Clothing Manipulation and Hygiene: Moderate assistance, Sit to/from stand Toileting - Clothing Manipulation Details (indicate cue type and reason): for thoroughness  Functional mobility during ADLs: Minimal assistance, Moderate assistance, Rolling walker General ADL Comments: Pt requries mod A to turn with walker and to negotiate small spaces such as the bathroom     Mobility  Overal bed mobility: Needs Assistance Bed Mobility: Supine to Sit Supine to sit: Min guard Sit to supine: Min guard General bed mobility comments: pt up in recliner on arrival    Transfers  Overall transfer level: Needs assistance Equipment used: Rolling walker (2 wheeled) Transfers: Sit to/from Stand Sit to Stand: Min assist, Mod assist Stand pivot transfers: Mod assist General transfer comment: pt continues with posterior bias; when pt has the back of his legs braced against the edge of bed, he required min assist to balance, visual and verbal cues for hand placement    Ambulation / Gait / Stairs / Wheelchair Mobility  Ambulation/Gait Ambulation/Gait assistance: Mod assist Ambulation Distance (Feet): 120 Feet Assistive device: Rolling walker (2 wheeled) Gait Pattern/deviations: Step-through pattern, Trunk flexed, Decreased stride  length General Gait Details: Multiple cues for more upright posture and remaining closer to RW and min assist for steadying; pt did not use a RW PTA; mod assist for balance with last 30 feet due to use of hand rail (no RW) for more challenge to balance, pt with poor R ankle DF during swing (also less R active DF then L when pt asked to tap toes in sitting) Gait velocity interpretation: Below normal speed for age/gender    Posture / Balance Static Standing Balance [place] - Eyes Opened:  (could not follow instructions or visual demonstration) Balance Overall balance assessment: Needs assistance Sitting-balance support: Feet supported Sitting balance-Leahy Scale: Good Standing balance support: During functional activity Standing balance-Leahy Scale: Poor Standing balance comment: min-mod assist for balance during gait, requires UE support Rhomberg - Eyes Opened:  (could not follow instructions or visual demonstration) High level balance activites: Side stepping, Direction changes, Turns, Sudden stops High Level Balance Comments: pt with difficulty following most commands (verbal and sometimes visual).  Special needs/care consideration BiPAP/CPAP: No CPM: No Continuous Drip IV: No Dialysis: No        Life Vest: No Oxygen: No Special Bed: No  Trach Size: N/A Wound Vac (area): N/A  Skin: WDL  Bowel mgmt: no documented BM since admission  Bladder mgmt:incontinent  Diabetic mgmt: N/A     Previous Home Environment Additional Comments: lives with son  Discharge Living Setting Plans for Discharge Living Setting: Patient's home Type of Home at Discharge: Mobile home Discharge Home Layout: One level Discharge Home Access: Stairs to enter Entrance Stairs-Rails: Right Entrance Stairs-Number of Steps: 4 Discharge Bathroom Shower/Tub: Walk-in shower, Door Discharge Bathroom Toilet: Standard Discharge Bathroom Accessibility: Yes How Accessible: Accessible via walker Does the patient  have any problems obtaining your medications?: No  Social/Family/Support Systems Patient Roles: Other (Comment) (Father) Contact Information: Daughter: Lenna Sciara [phone] Anticipated Caregiver: Son: Edoardo Laforte. Anticipated Caregiver's Contact Information: [phone] Ability/Limitations of Caregiver: Son disabled and can only provide Supervision; daughter able to provide physical assist in the evenings as needed. Caregiver Availability: Other (Comment) (Son able to provide supervision 24/7 and daughter int.) Discharge Plan Discussed with Primary Caregiver: Yes Is Caregiver In Agreement with Plan?: Yes Does Caregiver/Family have Issues with Lodging/Transportation while Pt is in Rehab?: No   Goals/Additional Needs Patient/Family Goal for Rehab: PT/OT/SLP Supervision  Expected length of stay: 16-19 days Cultural Considerations: Catholic not eating meat on Fridays during [place] Needs: No meat on Fridays until Easter Equipment Needs: TBD Special Service Needs: None Additional Information: N/A Pt/Family Agrees to Admission and willing to participate: Yes Program Orientation Provided & Reviewed with Pt/Caregiver Including Roles  & Responsibilities: Yes Additional Information Needs: None Information Needs to be Provided By: N/A   Decrease burden of Care through IP rehab admission: Not anticipated   Possible need for SNF placement upon discharge: Not anticipated   Patient Condition: This patient's condition remains as documented in the consult dated 05/22/15 @ 1540, in which the Rehabilitation Physician determined and documented that the patient's condition is appropriate for intensive rehabilitative care in an inpatient rehabilitation facility. Will admit to inpatient rehab today.  Preadmission Screen Completed By:  Gunnar Fusi, 05/24/2015 2:01 PM ______________________________________________________________________   Discussed status with Dr. Naaman Plummer on 05/24/15 at 1445  and received telephone approval for admission today.  Admission Coordinator:  Gunnar Fusi, time 1445/Date 05/24/15

## 2015-05-24 NOTE — Progress Notes (Signed)
Gunnar Fusi Rehab Admission Coordinator Signed Physical Medicine and Rehabilitation PMR Pre-admission 05/24/2015 2:01 PM  Related encounter: ED to Hosp-Admission (Current) from 05/20/2015 in [place] Collapse All   PMR Admission Coordinator Pre-Admission Assessment  Patient: Luis Robinson is an 75 y.o., male MRN: [id number] DOB: 09/09/1940 Height: 6' (182.9 cm) Weight: 87 kg (191 lb 12.8 oz)  Insurance Information HMO: PPO: X PCP: IPA: 80/20: OTHER:  PRIMARY: [employer] Medicare Policy#: [id number] Subscriber: Self CM Name: Luis Robinson Phone#: [phone] Fax#: [phone] Pre-Cert#: [id number] Employer: Retired Benefits: Phone #: [phone] Name: Luis Robinson. Date: 02/24/15 Deduct: 0 Out of Pocket Max: $4950.00 Life Max: N/A CIR: $295.00 days 1-6, Max $1770.00 SNF: $0 (days1-20) $164.00 (days 21-100) Outpatient: OT/PT/SLP Co-Pay: $40 Home Health: OT/PT/SLP Co-Pay: $0 DME: 80% Co-Pay: 20% Providers: in network   Medicaid Application Date: Case Manager:  Disability Application Date: Case Worker:   Emergency Contact Information Contact Information    Name Relation Home Work Mobile   [place] Son [phone]     [place], Daughter   [phone]     Current Medical History  Patient Admitting Diagnosis: L-MCA infarct  History of Present Illness: Luis Robinson is a 75 y.o. male with history of Hypertrophic cardiomyopathy/PAF with ablation/pacemaker maintained on Eliquis and followed by Dr. Bettina Gavia at [facility], HTN, tobacco abuse, back surgery 2 with antalgic gait who was admitted on 05/20/15 after fall with acute onset of right sided weakness with  inability to speak. 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 Mild mass effect upon the left lateral ventricle. He was found ot have A fib with RVR and IV tPA not given as patient had taken his dose of eliquis already. He underwent cerebral angio with complete revascularization of occluded L-MCA with single pass Solitaire and IA tPA and complete revascularization of L-ICA occlusion with IV tPA /stent assisted angioplasty. He tolerated extubation post procedure and has had improvement in right sided weakness. Patient with resultant right inattention, right foot drop, kyphosis, severe paraphrasia's and perseveration affecting cognition/comprehension. Neurology consulted with follow-up and currently maintained on aspirin and Plavix for CVA prophylaxis..Plan to repeat CT of the head in 1 week if hemorrhage resolving resume Eliquis to Plavix and stop aspirin at that time. Cardiology service is consulted in regards to history of atrial fibrillation currently maintained on amiodarone 400 mg twice daily with rate control and planned taper to 200 mg twice daily after 5 days then after 2 weeks decreased to 200 mg daily. Maintain on a dysphagia #2 thin liquid diet CIR recommended for follow up therapy. Patient was admitted for comprehensive rehabilitation program.  NIH Total: 3    Past Medical History  Past Medical History  Diagnosis Date  . Hypertrophic cardiomyopathy ([place])   . Hyperlipidemia   . Hypertension   . Tobacco use disorder   . Dyspnea   . Dizziness   . PAF (paroxysmal atrial fibrillation) ([place])   . SSS (sick sinus syndrome) ([place])   . CAD S/P percutaneous coronary angioplasty Aug 2016    Dx1 DES, no other significant dis  . Chronic anticoagulation     Eliquis    Family History  family history includes Cancer (age of onset: 34) in his sister.  Prior Rehab/Hospitalizations:  Has the patient had major  surgery during 100 days prior to admission? No; however, had pacemaker placed in November   Current Medications   Current facility-administered medications:  .  acetaminophen (TYLENOL) tablet 650 mg, 650 mg, Oral, Q6H PRN **OR** [DISCONTINUED] acetaminophen (TYLENOL) suppository 650 mg, 650 mg, Rectal, Q6H PRN, Luanne Bras, MD . amiodarone (PACERONE) tablet 400 mg, 400 mg, Oral, BID, Rosalin Hawking, MD, 400 mg at 05/24/15 0843 . chlorhexidine gluconate (SAGE KIT) (PERIDEX) 0.12 % solution 15 mL, 15 mL, Mouth Rinse, BID, Greta Doom, MD, 15 mL at 05/23/15 2011 . clopidogrel (PLAVIX) tablet 300 mg, 300 mg, Oral, Once, Luanne Bras, MD . clopidogrel (PLAVIX) tablet 75 mg, 75 mg, Oral, Q breakfast, Luanne Bras, MD, 75 mg at 05/24/15 0844 . feeding supplement (ENSURE ENLIVE) (ENSURE ENLIVE) liquid 237 mL, 237 mL, Oral, BID BM, Rosalin Hawking, MD, 237 mL at 05/24/15 0845 . lisinopril (PRINIVIL,ZESTRIL) tablet 20 mg, 20 mg, Oral, Daily, Rosalin Hawking, MD, 20 mg at 05/24/15 0844 . metoprolol succinate (TOPROL-XL) 24 hr tablet 50 mg, 50 mg, Oral, Daily, Rosalin Hawking, MD, 50 mg at 05/24/15 0843 . ondansetron (ZOFRAN) injection 4 mg, 4 mg, Intravenous, Q6H PRN, Luanne Bras, MD . pantoprazole (PROTONIX) EC tablet 40 mg, 40 mg, Oral, Daily, Rosalin Hawking, MD, 40 mg at 05/24/15 0843 . 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 Out daily visiting, running errands, walking and exercising.  Home Assistive Devices /  Equipment    Prior Device Use: Indicate devices/aids used by the patient prior to current illness, exacerbation or injury? None of the above  Prior Functional Level Prior Function Level of Independence: Independent  Self Care: Did the patient need help bathing, dressing, using the toilet or eating? Independent  Indoor Mobility: Did the patient need assistance with walking from room to room (with or without device)? Independent  Stairs: Did the patient need assistance with internal or external stairs (with or without device)? Independent  Functional Cognition: Did the patient need help planning regular tasks such as shopping or remembering to take medications? Independent  Current Functional Level Cognition  Arousal/Alertness: Awake/alert Overall Cognitive Status: Difficult to assess Difficult to assess due to: Impaired communication Current Attention Level: Sustained Orientation Level: Oriented to person, Oriented to time Following Commands: Follows one step commands inconsistently Safety/Judgement: Decreased awareness of safety General Comments: Pt required mod verbal cues to sequence through familiar grooming tasks  Attention: Focused, Sustained Focused Attention: Appears intact Sustained Attention: Appears intact Behaviors: Perseveration   Extremity Assessment (includes Sensation/Coordination)  Upper Extremity Assessment: RUE deficits/detail RUE Deficits / Details: grossly 4/5 RUE Coordination: decreased fine motor  Lower Extremity Assessment: Defer to PT evaluation RLE Deficits / Details: Generally weak ~4/5. Difficult to assess sensation as pt perseverative and with expressive deficits. Pain sensation appears intact. Decreased coordination and ataxic movements.  RLE Sensation: decreased light touch RLE Coordination: decreased fine motor, decreased gross motor    ADLs  Overall ADL's : Needs assistance/impaired Eating/Feeding: Set up, Sitting Grooming:  Wash/dry hands, Wash/dry face, Oral care, Standing, Minimal assistance Grooming Details (indicate cue type and reason): Pt requires min A for balance. He required mod cues to sequence how to brush teeth. He initially did not apply toothpaste to  toothbrush, then proceeded to brush teeth. Required therapist intervention to appley toothpaste. He then required cues to spit and rinse mouth. Pt also with difficulty sequencing washing face. He applied soap to dry washcloth and proceeded to wash face, then made no attempt to rinse soap off face - mod cues provided.  Upper Body Bathing: Minimal assitance, Sitting Lower Body Bathing: Moderate assistance, Sit to/from stand Lower Body Bathing Details (indicate cue type and reason): Assist for thoroughness and sequencing  Upper Body Dressing : Minimal assistance, Sitting Lower Body Dressing: Minimal assistance, Sit to/from stand Lower Body Dressing Details (indicate cue type and reason): able to don/doff socks  Toilet Transfer: Ambulation, Comfort height toilet, Grab bars, RW, Moderate assistance Toilet Transfer Details (indicate cue type and reason): mod A to maneuver in small locations  Toileting- Clothing Manipulation and Hygiene: Moderate assistance, Sit to/from stand Toileting - Clothing Manipulation Details (indicate cue type and reason): for thoroughness  Functional mobility during ADLs: Minimal assistance, Moderate assistance, Rolling walker General ADL Comments: Pt requries mod A to turn with walker and to negotiate small spaces such as the bathroom     Mobility  Overal bed mobility: Needs Assistance Bed Mobility: Supine to Sit Supine to sit: Min guard Sit to supine: Min guard General bed mobility comments: pt up in recliner on arrival    Transfers  Overall transfer level: Needs assistance Equipment used: Rolling walker (2 wheeled) Transfers: Sit to/from Stand Sit to Stand: Min assist, Mod assist Stand pivot transfers: Mod  assist General transfer comment: pt continues with posterior bias; when pt has the back of his legs braced against the edge of bed, he required min assist to balance, visual and verbal cues for hand placement    Ambulation / Gait / Stairs / Wheelchair Mobility  Ambulation/Gait Ambulation/Gait assistance: Mod assist Ambulation Distance (Feet): 120 Feet Assistive device: Rolling walker (2 wheeled) Gait Pattern/deviations: Step-through pattern, Trunk flexed, Decreased stride length General Gait Details: Multiple cues for more upright posture and remaining closer to RW and min assist for steadying; pt did not use a RW PTA; mod assist for balance with last 30 feet due to use of hand rail (no RW) for more challenge to balance, pt with poor R ankle DF during swing (also less R active DF then L when pt asked to tap toes in sitting) Gait velocity interpretation: Below normal speed for age/gender    Posture / Balance Static Standing Balance Rhomberg - Eyes Opened: (could not follow instructions or visual demonstration) Balance Overall balance assessment: Needs assistance Sitting-balance support: Feet supported Sitting balance-Leahy Scale: Good Standing balance support: During functional activity Standing balance-Leahy Scale: Poor Standing balance comment: min-mod assist for balance during gait, requires UE support Rhomberg - Eyes Opened: (could not follow instructions or visual demonstration) High level balance activites: Side stepping, Direction changes, Turns, Sudden stops High Level Balance Comments: pt with difficulty following most commands (verbal and sometimes visual).     Special needs/care consideration BiPAP/CPAP: No CPM: No Continuous Drip IV: No Dialysis: No  Life Vest: No Oxygen: No Special Bed: No  Trach Size: N/A Wound Vac (area): N/A  Skin: WDL  Bowel mgmt: no documented BM since admission  Bladder mgmt:incontinent  Diabetic mgmt: N/A      Previous Home Environment Additional Comments: lives with son  Discharge Living Setting Plans for Discharge Living Setting: Patient's home Type of Home at Discharge: Mobile home Discharge Home Layout: One level Discharge Home Access: Stairs to enter Entrance Stairs-Rails: Right Entrance  Stairs-Number of Steps: 4 Discharge Bathroom Shower/Tub: Walk-in shower, Door Discharge Bathroom Toilet: Standard Discharge Bathroom Accessibility: Yes How Accessible: Accessible via walker Does the patient have any problems obtaining your medications?: No  Social/Family/Support Systems Patient Roles: Other (Comment) (Father) Contact Information: Daughter: Luis Robinson [phone] Anticipated Caregiver: Son: Luis Robinson. Anticipated Caregiver's Contact Information: [phone] Ability/Limitations of Caregiver: Son disabled and can only provide Supervision; daughter able to provide physical assist in the evenings as needed. Caregiver Availability: Other (Comment) (Son able to provide supervision 24/7 and daughter int.) Discharge Plan Discussed with Primary Caregiver: Yes Is Caregiver In Agreement with Plan?: Yes Does Caregiver/Family have Issues with Lodging/Transportation while Pt is in Rehab?: No   Goals/Additional Needs Patient/Family Goal for Rehab: PT/OT/SLP Supervision  Expected length of stay: 16-19 days Cultural Considerations: Catholic not eating meat on Fridays during [place] Needs: No meat on Fridays until Easter Equipment Needs: TBD Special Service Needs: None Additional Information: N/A Pt/Family Agrees to Admission and willing to participate: Yes Program Orientation Provided & Reviewed with Pt/Caregiver Including Roles & Responsibilities: Yes Additional Information Needs: None Information Needs to be Provided By: N/A   Decrease burden of Care through IP rehab admission: Not anticipated   Possible need for SNF placement upon discharge: Not anticipated   Patient  Condition: This patient's condition remains as documented in the consult dated 05/22/15 @ 1540, in which the Rehabilitation Physician determined and documented that the patient's condition is appropriate for intensive rehabilitative care in an inpatient rehabilitation facility. Will admit to inpatient rehab today.  Preadmission Screen Completed By: Gunnar Fusi, 05/24/2015 2:01 PM ______________________________________________________________________  Discussed status with Dr. Naaman Plummer on 05/24/15 at 1445 and received telephone approval for admission today.  Admission Coordinator: Gunnar Fusi, time 1445/Date 05/24/15          Cosigned by: Meredith Staggers, MD at 05/24/2015 2:47 PM  Revision History     Date/Time User Provider Type Action   05/24/2015 2:47 PM Meredith Staggers, MD Physician Cosign   05/24/2015 2:47 PM Gunnar Fusi Rehab Admission Coordinator Sign

## 2015-05-24 NOTE — Progress Notes (Signed)
Occupational Therapy Treatment Patient Details Name: Luis Robinson MRN: [id number] DOB: 1940/09/15 Today's Date: 05/24/2015    History of present illness Pt presents with L MCA Infarct with L MCA and ICA REvascularization and L ICA Stent placed.  pt with hx of HTN, Cardiomyopathy, Back Surgery, Rotator Cuff Surgery, and Pacemaker inserted, replaced, and then removed.     OT comments  Pt requires mod cues to sequence simple grooming and bathing activity, min A for balance.  He did demonstrate one LOB while turning and required mod A to prevent fall.  Recommend CIR  Follow Up Recommendations  CIR;Supervision/Assistance - 24 hour    Equipment Recommendations  3 in 1 bedside comode    Recommendations for Other Services      Precautions / Restrictions Precautions Precautions: Fall       Mobility Bed Mobility Overal bed mobility: Needs Assistance Bed Mobility: Supine to Sit     Supine to sit: Min guard     General bed mobility comments: pt up in recliner on arrival  Transfers Overall transfer level: Needs assistance Equipment used: Rolling walker (2 wheeled) Transfers: Sit to/from Stand Sit to Stand: Min assist;Mod assist Stand pivot transfers: Mod assist       General transfer comment: pt continues with posterior bias; when pt has the back of his legs braced against the edge of bed, he required min assist to balance, visual and verbal cues for hand placement    Balance Overall balance assessment: Needs assistance Sitting-balance support: Feet supported Sitting balance-Leahy Scale: Good     Standing balance support: During functional activity Standing balance-Leahy Scale: Poor Standing balance comment: min A for balance                    ADL Overall ADL's : Needs assistance/impaired     Grooming: Wash/dry hands;Wash/dry face;Oral care;Standing;Minimal assistance Grooming Details (indicate cue type and reason): Pt requires min A for balance.  He  required mod cues to sequence how to brush teeth.  He initially did not apply toothpaste to toothbrush, then proceeded to brush teeth.  Required therapist intervention to appley toothpaste.  He then required cues to spit and rinse mouth.  Pt also with difficulty sequencing washing face.  He applied soap to dry washcloth and proceeded to wash face, then made no attempt to rinse soap off face - mod cues provided.      Lower Body Bathing: Moderate assistance;Sit to/from stand Lower Body Bathing Details (indicate cue type and reason): Assist for thoroughness and sequencing      Lower Body Dressing: Minimal assistance;Sit to/from stand               Functional mobility during ADLs: Minimal assistance;Moderate assistance;Rolling walker        Vision                     Perception     Praxis      Cognition   Behavior During Therapy: WFL for tasks assessed/performed Overall Cognitive Status: Difficult to assess Area of Impairment: Orientation;Attention;Problem solving;Safety/judgement;Following commands Orientation Level: Disoriented to;Place;Time;Situation Current Attention Level: Sustained    Following Commands: Follows one step commands inconsistently Safety/Judgement: Decreased awareness of safety   Problem Solving: Slow processing;Requires verbal cues;Difficulty sequencing;Requires tactile cues General Comments: Pt required mod verbal cues to sequence through familiar grooming tasks     Extremity/Trunk Assessment               Exercises  Shoulder Instructions       General Comments      Pertinent Vitals/ Pain       Pain Assessment: No/denies pain  Home Living                                          Prior Functioning/Environment              Frequency Min 2X/week     Progress Toward Goals  OT Goals(current goals can now be found in the care plan section)  Progress towards OT goals: Progressing toward goals  ADL  Goals Pt Will Perform Grooming: with supervision;standing Pt Will Perform Upper Body Bathing: with supervision;sitting Pt Will Perform Lower Body Bathing: with supervision;sit to/from stand Pt Will Perform Upper Body Dressing: with supervision;sitting Pt Will Perform Lower Body Dressing: with supervision;sit to/from stand Pt Will Transfer to Toilet: with supervision;ambulating;regular height toilet;bedside commode;grab bars Pt Will Perform Toileting - Clothing Manipulation and hygiene: with supervision;sit to/from stand  Plan Discharge plan remains appropriate    Co-evaluation                 End of Session     Activity Tolerance     Patient Left     Nurse Communication          Time: [id number] OT Time Calculation (min): 28 min  Charges: OT General Charges $OT Visit: 1 Procedure OT Treatments $Therapeutic Activity: 23-37 mins  Wayman Hoard M 05/24/2015, 12:15 PM

## 2015-05-25 ENCOUNTER — Inpatient Hospital Stay (HOSPITAL_COMMUNITY): Payer: Medicare HMO | Admitting: Speech Pathology

## 2015-05-25 ENCOUNTER — Inpatient Hospital Stay (HOSPITAL_COMMUNITY): Payer: Medicare HMO | Admitting: *Deleted

## 2015-05-25 ENCOUNTER — Inpatient Hospital Stay (HOSPITAL_COMMUNITY): Payer: Medicare HMO

## 2015-05-25 DIAGNOSIS — IMO0002 Reserved for concepts with insufficient information to code with codable children: Secondary | ICD-10-CM

## 2015-05-25 DIAGNOSIS — G8191 Hemiplegia, unspecified affecting right dominant side: Secondary | ICD-10-CM

## 2015-05-25 HISTORY — DX: Hemiplegia, unspecified affecting right dominant side: G81.91

## 2015-05-25 HISTORY — DX: Reserved for concepts with insufficient information to code with codable children: IMO0002

## 2015-05-25 MED ORDER — METOPROLOL TARTRATE 25 MG PO TABS: 25.0000 mg | ORAL_TABLET | Freq: Two times a day (BID) | ORAL | Status: DC

## 2015-05-25 MED ORDER — ASPIRIN 81 MG PO CHEW
81.0000 mg | CHEWABLE_TABLET | Freq: Every day | ORAL | Status: DC
  Administered 2015-05-26 – 2015-06-04 (×10): 81 mg via ORAL
  Filled 2015-05-25 (×10): qty 1

## 2015-05-25 MED ORDER — ENSURE ENLIVE PO LIQD
237.0000 mL | Freq: Two times a day (BID) | ORAL | Status: DC
  Administered 2015-05-26 – 2015-05-27 (×3): 237 mL via ORAL

## 2015-05-25 MED ORDER — CEPHALEXIN 250 MG PO CAPS
250.0000 mg | ORAL_CAPSULE | Freq: Three times a day (TID) | ORAL | Status: DC
  Administered 2015-05-25 – 2015-06-05 (×34): 250 mg via ORAL
  Filled 2015-05-25 (×34): qty 1

## 2015-05-25 MED ORDER — FUROSEMIDE 40 MG PO TABS
40.0000 mg | ORAL_TABLET | Freq: Every day | ORAL | Status: DC
  Administered 2015-05-25: 40 mg via ORAL
  Filled 2015-05-25: qty 1

## 2015-05-25 NOTE — Evaluation (Signed)
Occupational Therapy Assessment and Plan  Patient Details  Name: Luis Robinson MRN: [id number] Date of Birth: [birth year]  OT Diagnosis: abnormal posture, cognitive deficits and muscle weakness (generalized) Rehab Potential: Rehab Potential (ACUTE ONLY): Good ELOS: 7-10 days   Today's Date: 05/25/2015 OT Individual Time: 0700-0800 OT Individual Time Calculation (min): 60 min     Problem List:  Patient Active Problem List   Diagnosis Date Noted  . Aphasia due to stroke 05/25/2015  . Right hemiparesis ([place]) 05/25/2015  . Left middle cerebral artery stroke ([place]) 05/24/2015  . SSS (sick sinus syndrome) ([place]) 05/22/2015  . Cardiac pacemaker in situ-MDT- implanted 2008 05/22/2015  . Chronic anticoagulation-Eliquis prior to adm 05/22/2015  . Atrial fibrillation with RVR ([place]) 05/22/2015  . CAD- S/P Dx1 DES Aug 2016 05/22/2015  . Dyslipidemia 05/22/2015  . Prediabetes   . Tobacco abuse   . Paroxysmal atrial fibrillation ([facility])   . Benign essential HTN   . History of back surgery   . Acute blood loss anemia   . Hypertrophic obstructive cardiomyopathy ([place])   . Tachypnea   . Leukocytosis   . Thrombocytopenia ([place])   . CVA due to occlusion of cerebral artery- treated with LMCA stent 05/20/15   . History of ETT   . Respiratory failure with hypoxia ([place])   . Stroke ([place])   . Stroke (cerebrum) ([place]) 05/20/2015    Past Medical History:  Past Medical History  Diagnosis Date  . Hypertrophic cardiomyopathy ([place])   . Hyperlipidemia   . Hypertension   . Tobacco use disorder   . Dyspnea   . Dizziness   . PAF (paroxysmal atrial fibrillation) ([place])   . SSS (sick sinus syndrome) ([place])   . CAD S/P percutaneous coronary angioplasty Aug 2016    Dx1 DES, no other significant dis  . Chronic anticoagulation     Eliquis   Past Surgical History:  Past Surgical History  Procedure Laterality Date  . Back surgery x 2    . Rotator cuff surgery    . Insert / replace / remove pacemaker  2008     MDT  . Radiology with anesthesia N/A 05/20/2015    Procedure: RADIOLOGY WITH ANESTHESIA;  Surgeon: Medication Radiologist, MD;  Location: [facility];  Service: Radiology;  Laterality: N/A;  . Ablation  Nov 2016    [facility]    Assessment & Plan Clinical Impression: Luis Robinson is a 75 y.o. male with history of Hypertrophic cardiomyopathy/PAF with ablation/pacemaker maintained on Eliquis and followed by Dr. Bettina Gavia at [facility], HTN, tobacco abuse, back surgery 2 with antalgic gait who was admitted on 05/20/15 after fall with acute onset of right sided weakness with inability to speak. 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 Mild mass effect upon the left lateral ventricle. He was found ot have A fib with RVR and IV tPA not given as patient had taken his dose of eliquis already. He underwent cerebral angio with complete revascularization of occluded L-MCA with single pass Solitaire and IA tPA and complete revascularization of L-ICA occlusion with IV tPA /stent assisted angioplasty. He tolerated extubation post procedure and has had improvement in right sided weakness. Patient with resultant right inattention, right foot drop, kyphosis, severe paraphrasia's and perseveration affecting cognition/comprehension. Neurology consulted with follow-up and currently maintained on aspirin and Plavix for CVA prophylaxis..Plan to repeat CT of the head in 1 week if hemorrhage resolving resume Eliquis to Plavix and stop aspirin at that time. Cardiology service is  consulted in regards to history of atrial fibrillation currently maintained on amiodarone 400 mg twice daily with rate control and planned taper to 200 mg twice daily after 5 days then after 2 weeks decreased to 200 mg daily. Maintain on a dysphagia #2 thin liquid diet CIR recommended for follow up therapy. Patient transferred to CIR on 05/24/2015 .    Patient currently requires min with basic self-care skills secondary to  muscle weakness, decreased cardiorespiratoy endurance, decreased attention to right, decreased awareness, decreased problem solving, decreased safety awareness and decreased memory and decreased standing balance, decreased postural control and decreased balance strategies.  Prior to hospitalization, patient could complete BADLs with modified independent .  Patient will benefit from skilled intervention to increase independence with basic self-care skills prior to discharge home with care partner.  Anticipate patient will require 24 hour supervision and follow-up to be determined.  OT - End of Session Activity Tolerance: Decreased this session Endurance Deficit: Yes OT Assessment Rehab Potential (ACUTE ONLY): Good OT Patient demonstrates impairments in the following area(s): Balance;Cognition;Endurance;Motor;Perception;Safety;Sensory;Vision OT Basic ADL's Functional Problem(s): Grooming;Bathing;Dressing;Toileting OT Transfers Functional Problem(s): Toilet;Tub/Shower OT Additional Impairment(s): Other (comment) (cognitive deficits) OT Plan OT Intensity: Minimum of 1-2 x/day, 45 to 90 minutes OT Frequency: 5 out of 7 days OT Duration/Estimated Length of Stay: 7-10 days OT Treatment/Interventions: Balance/vestibular training;Cognitive remediation/compensation;Community reintegration;Discharge planning;Neuromuscular re-education;Functional mobility training;Patient/family education;DME/adaptive equipment instruction;Psychosocial support;Self Care/advanced ADL retraining;Splinting/orthotics;Therapeutic Activities;Therapeutic Exercise;UE/LE Strength taining/ROM;UE/LE Coordination activities;Visual/perceptual remediation/compensation;Wheelchair propulsion/positioning OT Self Feeding Anticipated Outcome(s): n/a OT Basic Self-Care Anticipated Outcome(s): supervision OT Toileting Anticipated Outcome(s): supervision OT Bathroom Transfers Anticipated Outcome(s): supervision OT Recommendation Patient  destination: Home Follow Up Recommendations: Other (comment) (TBD) Equipment Recommended: To be determined   Skilled Therapeutic Intervention OT evaluation completed. Discussed role of OT, goals of therapy, possible ELOS, safety plan, fall risk, and DME. Pt on room air throughout session with SpO2 >92% throughout. SpO2 dropped to 90% 2x following standing activity however returned to 95%+ within 5 seconds. Pt completed bathing sit<>stand at sink with min cues for sequencing and min assist for standing balance. Pt demonstrated increased difficulty with LB self-care requiring mod A for dressing. Pt left sitting in w/c with quick release belt donned and all needs in reach.  OT Evaluation Precautions/Restrictions  Precautions Precautions: Fall Restrictions Weight Bearing Restrictions: No General   Vital Signs   Pain Pain Assessment Pain Assessment: No/denies pain Home Living/Prior Functioning Home Living Available Help at Discharge: Family Home Layout: One level Bathroom Shower/Tub: Chiropodist: Standard Bathroom Accessibility: Yes  Lives With: Son Prior Function Level of Independence: Independent with basic ADLs, Other (comment), Independent with transfers (son did meal prep and house keeping) ADL   Vision/Perception  Vision- History Baseline Vision/History: Wears glasses Wears Glasses: At all times Patient Visual Report: No change from baseline Vision- Assessment Vision Assessment?: Yes Eye Alignment: Within Functional Limits Ocular Range of Motion: Within Functional Limits Alignment/Gaze Preference: Within Defined Limits Tracking/Visual Pursuits: Able to track stimulus in all quads without difficulty Perception Comments: mild right inattention  Cognition Overall Cognitive Status: Impaired/Different from baseline Arousal/Alertness: Awake/alert Orientation Level: Person;Situation Year: Other (Comment) (2003) Month: March Day of Week:  Incorrect Memory: Impaired Memory Impairment: Decreased recall of new information;Retrieval deficit Immediate Memory Recall: Sock;Blue;Bed Memory Recall:  (unable to recall any with cue) Attention: Sustained Sustained Attention: Appears intact Awareness: Impaired Awareness Impairment: Intellectual impairment Problem Solving: Impaired Problem Solving Impairment: Functional basic Behaviors: Perseveration Safety/Judgment: Impaired Sensation Sensation Light Touch: Appears Intact Hot/Cold: Appears Intact Coordination  Gross Motor Movements are Fluid and Coordinated: No Fine Motor Movements are Fluid and Coordinated: No (mild right weakness) Finger Nose Finger Test: [facility]  Balance Balance Balance Assessed: Yes Dynamic Sitting Balance Dynamic Sitting - Balance Support: During functional activity Dynamic Sitting - Level of Assistance: 6: Modified independent (Device/Increase time) Static Standing Balance Static Standing - Balance Support: Bilateral upper extremity supported Static Standing - Level of Assistance: 4: Min assist Dynamic Standing Balance Dynamic Standing - Balance Support: During functional activity Dynamic Standing - Level of Assistance: 4: Min assist Extremity/Trunk Assessment RUE Assessment RUE Assessment: Within Functional Limits (mild weakness in right hand) LUE Assessment LUE Assessment: Within Functional Limits   See Function Navigator for Current Functional Status.   Refer to Care Plan for Long Term Goals  Recommendations for other services: None  Discharge Criteria: Patient will be discharged from OT if patient refuses treatment 3 consecutive times without medical reason, if treatment goals not met, if there is a change in medical status, if patient makes no progress towards goals or if patient is discharged from hospital.  The above assessment, treatment plan, treatment alternatives and goals were discussed and mutually agreed upon: by  patient  Duayne Cal 05/25/2015, 12:45 PM

## 2015-05-25 NOTE — Evaluation (Signed)
Speech Language Pathology Assessment and Plan  Patient Details  Name: Luis Robinson MRN: [id number] Date of Birth: 03/16/1940  SLP Diagnosis: Aphasia;Cognitive Impairments;Dysphagia  Rehab Potential: Good ELOS: 7-10 days    Today's Date: 05/25/2015 SLP Individual Time: 0845-0945 SLP Individual Time Calculation (min): 60 min   Problem List:  Patient Active Problem List   Diagnosis Date Noted  . Aphasia due to stroke 05/25/2015  . Right hemiparesis ([facility]) 05/25/2015  . 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 SSS (sick sinus syndrome) ([facility]) 05/22/2015  . Cardiac pacemaker in situ-MDT- implanted 2008 05/22/2015  . Chronic anticoagulation-Eliquis prior to adm 05/22/2015  . Atrial fibrillation with RVR ([facility]) 05/22/2015  . CAD- S/P Dx1 DES Aug 2016 05/22/2015  . Dyslipidemia 05/22/2015  . Prediabetes   . Tobacco abuse   . Paroxysmal atrial fibrillation ([facility])   . Benign essential HTN   . History of back surgery   . Acute blood loss anemia   . Hypertrophic obstructive cardiomyopathy ([facility])   . Tachypnea   . Leukocytosis   . Thrombocytopenia ([facility])   . CVA due to occlusion of cerebral artery- treated with LMCA stent 05/20/15   . History of ETT   . Respiratory failure with hypoxia ([facility])   . Stroke ([facility])   . Stroke (cerebrum) ([facility]) 05/20/2015   Past Medical History:  Past Medical History  Diagnosis Date  . Hypertrophic cardiomyopathy ([facility])   . Hyperlipidemia   . Hypertension   . Tobacco use disorder   . Dyspnea   . Dizziness   . PAF (paroxysmal atrial fibrillation) ([facility])   . SSS (sick sinus syndrome) (H[facility])   . CAD S/P percutaneous coronary angioplasty Aug 2016    Dx1 DES, no other significant dis  . Chronic anticoagulation     Eliquis   Past Surgical History:  Past Surgical History  Procedure Laterality Date  . Back surgery x 2    . Rotator cuff surgery    . Insert / replace / remove pacemaker  2008    MDT  . Radiology with anesthesia N/A 05/20/2015   Procedure: RADIOLOGY WITH ANESTHESIA;  Surgeon: Medication Radiologist, MD;  Location: [facility];  Service: Radiology;  Laterality: N/A;  . Ablation  Nov 2016    [facility]    Assessment / Plan / Recommendation Clinical Impression   Luis Robinson is a 75 y.o. male with history of Hypertrophic cardiomyopathy/PAF with ablation/pacemaker maintained on Eliquis and followed by Dr. Munley at [facility], HTN, tobacco abuse, back surgery 2 with antalgic gait who was admitted on 05/20/15 after fall with acute onset of right sided weakness with inability to speak. 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 Mild mass effect upon the left lateral ventricle. He was found ot have A fib with RVR and IV tPA not given as patient had taken his dose of eliquis already. He underwent cerebral angio with complete revascularization of occluded L-MCA with single pass Solitaire and IA tPA and complete revascularization of L-ICA occlusion with IV tPA /stent assisted angioplasty. He tolerated extubation post procedure and has had improvement in right sided weakness. Patient with resultant right inattention, right foot drop, kyphosis, severe paraphrasia's and perseveration affecting cognition/comprehension. Pt participated in speech/language and cognitive assessment as well as clinical assessment of swallow function upon transfer to CIR. Pt demonstrates expressive/receptive aphasia characterized by paraphasias with speech and limited awareness of deficits. Repetition is intact. Swallow function is characterized by mild diffuse oral residue with advanced   textures. Recommend Dys 3 with thins with intermittent supervision and swallow precautions.Pt would benefit from SLP services for cognitive-linguistic and swallowing impairments at CIR to maximize functional independence with eating and communication.   Skilled Therapeutic Interventions          [place] Naming Test short form was completed with 5/15  acc. Simple Y/N ?s answered with 50% acc. Pt able to demonstrate writing at the single word level, however reading comprehension was 0% acc at the single word level. Sentence completion 4/7.  Pt was oriented to self only and adamantly denied being in the hospital.   SLP Assessment  Patient will need skilled Speech Lanaguage Pathology Services during CIR admission    Recommendations  SLP Diet Recommendations: Dysphagia 3 (Mech soft);Thin Liquid Administration via: Cup Medication Administration: Crushed with puree (pt chews meds when trialed whole) Supervision: Patient able to self feed;Intermittent supervision to cue for compensatory strategies Compensations: Slow rate;Small sips/bites;Lingual sweep for clearance of pocketing;Minimize environmental distractions (No straws) Postural Changes and/or Swallow Maneuvers: Out of bed for meals Oral Care Recommendations: Oral care BID Patient destination: Home Follow up Recommendations: Outpatient SLP;24 hour supervision/assistance Equipment Recommended: None recommended by SLP    SLP Frequency 3 to 5 out of 7 days   SLP Duration  SLP Intensity  SLP Treatment/Interventions 7-10 days  Minumum of 1-2 x/day, 30 to 90 minutes  Cognitive remediation/compensation;Multimodal communication approach;Speech/Language facilitation;Cueing hierarchy;Functional tasks;Therapeutic Activities;Patient/family education;Dysphagia/aspiration precaution training    Pain Pain Assessment Pain Assessment: No/denies pain Pain Score: 0-No pain  Prior Functioning Cognitive/Linguistic Baseline: Within functional limits Type of Home: House  Lives With: Son Available Help at Discharge: Family Vocation: Retired  Function:  Eating Eating   Modified Consistency Diet: Yes Eating Assist Level: Set up assist for;Supervision or verbal cues           Cognition Comprehension Comprehension assist level: Understands basic 25 - 49% of the time/ requires cueing 50 - 75%  of the time  Expression   Expression assist level: Expresses basis less than 25% of the time/requires cueing >75% of the time.  Social Interaction Social Interaction assist level: Interacts appropriately 50 - 74% of the time - May be physically or verbally inappropriate.  Problem Solving Problem solving assist level: Solves basic 25 - 49% of the time - needs direction more than half the time to initiate, plan or complete simple activities  Memory Memory assist level: Recognizes or recalls less than 25% of the time/requires cueing greater than 75% of the time   Short Term Goals: Week 1: SLP Short Term Goal 1 (Week 1): Pt demonstrate ability to answer simple Y/N questions  accurately with mod A. SLP Short Term Goal 2 (Week 1): Pt demonstrate reading comprehension at the single word level with min A. SLP Short Term Goal 3 (Week 1): Pt demonstrate O x 4 with mod A. SLP Short Term Goal 4 (Week 1): Pt demonstrate ability to follow 2-step directives with mod A.  SLP Short Term Goal 5 (Week 1): Pt demonstrate object naming to identifiable approximation with mod A. SLP Short Term Goal 6 (Week 1): Pt tolerate trials of regular texture without oral residue with min A.  Refer to Care Plan for Long Term Goals  Recommendations for other services: None  Discharge Criteria: Patient will be discharged from SLP if patient refuses treatment 3 consecutive times without medical reason, if treatment goals not met, if there is a change in medical status, if patient makes no progress towards goals or   if patient is discharged from hospital.  The above assessment, treatment plan, treatment alternatives and goals were discussed and mutually agreed upon: by patient   Weldon Inches, [place], [place] 05/25/2015, 4:49 PM

## 2015-05-25 NOTE — Progress Notes (Signed)
75 y.o. male with history of Hypertrophic cardiomyopathy/PAF with ablation/pacemaker maintained on Eliquis and followed by Dr. Bettina Gavia at [facility], HTN, tobacco abuse, back surgery 2 with antalgic gait who was admitted on 05/20/15 after fall with acute onset of right sided weakness with inability to speak. 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 Mild mass effect upon the left lateral ventricle. He was found ot have A fib with RVR and IV tPA not given as patient had taken his dose of eliquis already. He underwent cerebral angio with complete revascularization of occluded L-MCA with single pass Solitaire and IA tPA and complete revascularization of L-ICA occlusion with IV tPA /stent assisted angioplasty. He tolerated extubation post procedure and has had improvement in right sided weakness. Patient with resultant right inattention, right foot drop, kyphosis, severe paraphrasia's and perseveration affecting cognition/comprehension. Neurology consulted with follow-up and currently maintained on aspirin and Plavix for CVA prophylaxis..Plan to repeat CT of the head in 1 week if hemorrhage resolving resume Eliquis to Plavix and stop aspirin at that time  Subjective/Complaints: SOB and wheezing resolved after Lasix IV, needed condom cath due to increased UO ROS limited aphasia Objective: Vital Signs: Blood pressure 101/55, pulse 62, temperature 98.8 F (37.1 C), temperature source Oral, resp. rate 20, weight 75.932 kg (167 lb 6.4 oz), SpO2 97 %. Dg Chest Port 1 View  05/24/2015  CLINICAL DATA:  Shortness of breath with cough today. EXAM: PORTABLE CHEST 1 VIEW COMPARISON:  05/22/2015 and 05/21/2015. FINDINGS: Left subclavian pacemaker leads appear unchanged, although the tip of the ventricular lead is not visualized. There is stable cardiomegaly and aortic atherosclerosis. There is new pulmonary edema consistent with congestive heart failure. No pneumothorax or  significant pleural effusion identified. The bones appear unchanged with postsurgical changes at the right humeral head. IMPRESSION: Cardiomegaly with new pulmonary edema consistent with congestive heart failure. Electronically Signed   By: Richardean Sale M.D.   On: 05/24/2015 08:01   Results for orders placed or performed during the hospital encounter of 05/20/15 (from the past 72 hour(s))  CBC     Status: Abnormal   Collection Time: 05/23/15  3:20 AM  Result Value Ref Range   WBC 17.4 (H) 4.0 - 10.5 K/uL   RBC 3.91 (L) 4.22 - 5.81 MIL/uL   Hemoglobin 11.9 (L) 13.0 - 17.0 g/dL   HCT 36.5 (L) 39.0 - 52.0 %   MCV 93.4 78.0 - 100.0 fL   MCH 30.4 26.0 - 34.0 pg   MCHC 32.6 30.0 - 36.0 g/dL   RDW 13.8 11.5 - 15.5 %   Platelets 107 (L) 150 - 400 K/uL    Comment: CONSISTENT WITH PREVIOUS RESULT  Basic metabolic panel     Status: Abnormal   Collection Time: 05/23/15  3:20 AM  Result Value Ref Range   Sodium 138 135 - 145 mmol/L   Potassium 3.4 (L) 3.5 - 5.1 mmol/L   Chloride 107 101 - 111 mmol/L   CO2 23 22 - 32 mmol/L   Glucose, Bld 129 (H) 65 - 99 mg/dL   BUN 11 6 - 20 mg/dL   Creatinine, Ser 0.89 0.61 - 1.24 mg/dL   Calcium 8.7 (L) 8.9 - 10.3 mg/dL   GFR calc non Af Amer >60 >60 mL/min   GFR calc Af Amer >60 >60 mL/min    Comment: (NOTE) The eGFR has been calculated using the CKD EPI equation. This calculation has not been validated in all clinical situations. eGFR's  persistently <60 mL/min signify possible Chronic Kidney Disease.    Anion gap 8 5 - 15  TSH     Status: None   Collection Time: 05/23/15  3:20 AM  Result Value Ref Range   TSH 0.791 0.350 - 4.500 uIU/mL  Urinalysis with microscopic (not at [facility])     Status: Abnormal   Collection Time: 05/24/15  2:02 AM  Result Value Ref Range   Color, Urine AMBER (A) YELLOW    Comment: BIOCHEMICALS MAY BE AFFECTED BY COLOR   APPearance HAZY (A) CLEAR   Specific Gravity, Urine 1.018 1.005 - 1.030   pH 5.5 5.0 - 8.0   Glucose,  UA NEGATIVE NEGATIVE mg/dL   Hgb urine dipstick LARGE (A) NEGATIVE   Bilirubin Urine NEGATIVE NEGATIVE   Ketones, ur 40 (A) NEGATIVE mg/dL   Protein, ur 100 (A) NEGATIVE mg/dL   Nitrite NEGATIVE NEGATIVE   Leukocytes, UA SMALL (A) NEGATIVE   WBC, UA 6-30 0 - 5 WBC/hpf   RBC / HPF TOO NUMEROUS TO COUNT 0 - 5 RBC/hpf   Bacteria, UA FEW (A) NONE SEEN   Squamous Epithelial / LPF 0-5 (A) NONE SEEN   Casts HYALINE CASTS (A) NEGATIVE   Urine-Other MUCOUS PRESENT   CBC     Status: Abnormal   Collection Time: 05/24/15  5:47 AM  Result Value Ref Range   WBC 17.8 (H) 4.0 - 10.5 K/uL   RBC 3.83 (L) 4.22 - 5.81 MIL/uL   Hemoglobin 12.0 (L) 13.0 - 17.0 g/dL   HCT 35.4 (L) 39.0 - 52.0 %   MCV 92.4 78.0 - 100.0 fL   MCH 31.3 26.0 - 34.0 pg   MCHC 33.9 30.0 - 36.0 g/dL   RDW 13.9 11.5 - 15.5 %   Platelets 117 (L) 150 - 400 K/uL    Comment: CONSISTENT WITH PREVIOUS RESULT  Basic metabolic panel     Status: Abnormal   Collection Time: 05/24/15  5:47 AM  Result Value Ref Range   Sodium 137 135 - 145 mmol/L   Potassium 3.6 3.5 - 5.1 mmol/L   Chloride 104 101 - 111 mmol/L   CO2 24 22 - 32 mmol/L   Glucose, Bld 114 (H) 65 - 99 mg/dL   BUN 11 6 - 20 mg/dL   Creatinine, Ser 0.92 0.61 - 1.24 mg/dL   Calcium 8.6 (L) 8.9 - 10.3 mg/dL   GFR calc non Af Amer >60 >60 mL/min   GFR calc Af Amer >60 >60 mL/min    Comment: (NOTE) The eGFR has been calculated using the CKD EPI equation. This calculation has not been validated in all clinical situations. eGFR's persistently <60 mL/min signify possible Chronic Kidney Disease.    Anion gap 9 5 - 15      General: No acute distress Mood and affect are appropriate Heart: Regular rate and rhythm no rubs murmurs or extra sounds Lungs: Clear to auscultation, breathing unlabored, no rales or wheezes Abdomen: Positive bowel sounds, soft nontender to palpation, nondistended Extremities: No clubbing, cyanosis, or edema Skin: No evidence of breakdown, no  evidence of rash Neurologic: Cranial nerves II through XII intact, motor strength is 5/5 in bilateral deltoid, bicep, tricep, grip, hip flexor, knee extensors, ankle dorsiflexor and plantar flexor Sensory exam cannot assess due to aphasia Good receptive but imparied exp language Musculoskeletal: Full range of motion in all 4 extremities. No joint swelling   Assessment/Plan: 1. Functional deficits secondary to Left MCA infarct with aphasia and gait d/o  which require 3+ hours per day of interdisciplinary therapy in a comprehensive inpatient rehab setting. Physiatrist is providing close team supervision and 24 hour management of active medical problems listed below. Physiatrist and rehab team continue to assess barriers to discharge/monitor patient progress toward functional and medical goals. FIM:       Function - Toileting Toileting activity did not occur: No continent bowel/bladder event  Function - Air cabin crew transfer activity did not occur: Safety/medical concerns        Function - Comprehension Comprehension: Auditory Comprehension assist level: Understands basic 50 - 74% of the time/ requires cueing 25 - 49% of the time  Function - Expression Expression: Verbal Expression assist level: Expresses basis less than 25% of the time/requires cueing >75% of the time.  Function - Social Interaction Social Interaction assist level: Interacts appropriately 75 - 89% of the time - Needs redirection for appropriate language or to initiate interaction.  Function - Problem Solving Problem solving assist level: Solves basic less than 25% of the time - needs direction nearly all the time or does not effectively solve problems and may need a restraint for safety  Function - Memory Memory assist level: Recognizes or recalls less than 25% of the time/requires cueing greater than 75% of the time  Medical Problem List and Plan: 1.  Right hemiparesis/aphasia  secondary to left  MCA infarct secondary to left ICA /MCA occlusion status post revascularization with mechanical thrombectomy               -begin CIR therapies 2.  DVT Prophylaxis/Anticoagulation: SCDs   3. Pain Management/history of multiple back surgeries: Tylenol as needed               -pain appears controlled at present 4. Dysphagia. Dysphagia #2 thin liquids. Follow-up speech therapy and advance as appropriate 5. Neuropsych: This patient is  capable of making decisions on his  own behalf. 6. Skin/Wound Care: Routine skin checks   7. Fluids/Electrolytes/Nutrition: Routine I&O with follow-up chemistries upon admit 8. Atrial fibrillation/hypertropic cardiomyopathy/pacemaker. Follow-up per cardiology services. Heart rate controlled at present. Continue amiodarone 400 mg twice daily for 5 days then decrease to 200 mg twice daily and then after 2 weeks decrease to 200 mg daily 9. Hypertension. Lisinopril 20 mg daily, Toprol 50 mg daily. Monitor with increased mobility 10. Hyperlipidemia. Crestor 11. Appearance of labored breathing: family reports that he appears this way at baseline. Improved after IV lasix, ECHO showed good EF, but has Mitral reurg 12.  Aphasia- SLP eval  LOS (Days) 1 A FACE TO FACE EVALUATION WAS PERFORMED  Kajah Santizo E 05/25/2015, 6:53 AM

## 2015-05-25 NOTE — H&P (View-Only) (Signed)
Physical Medicine and Rehabilitation Admission H&P    Chief Complaint  Patient presents with  . Code Stroke  : HPI: Luis Robinson is a 75 y.o. male with history of Hypertrophic cardiomyopathy/PAF with ablation/pacemaker maintained on Eliquis and followed by Dr. Bettina Gavia at [facility], HTN, tobacco abuse, back surgery 2 with antalgic gait who was admitted on 05/20/15 after fall with acute onset of right sided weakness with inability to speak. 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 Mild mass effect upon the left lateral ventricle. He was found ot have A fib with RVR and IV tPA not given as patient had taken his dose of eliquis already. He underwent cerebral angio with complete revascularization of occluded L-MCA with single pass Solitaire and IA tPA and complete revascularization of L-ICA occlusion with IV tPA /stent assisted angioplasty. He tolerated extubation post procedure and has had improvement in right sided weakness. Patient with resultant right inattention, right foot drop, kyphosis, severe paraphrasia's and perseveration affecting cognition/comprehension. Neurology consulted with follow-up and currently maintained on aspirin and Plavix for CVA prophylaxis..Plan to repeat CT of the head in 1 week if hemorrhage resolving resume Eliquis to Plavix and stop aspirin at that time. Cardiology service is consulted in regards to history of atrial fibrillation currently maintained on amiodarone 400 mg twice daily with rate control and planned taper to 200 mg twice daily after 5 days then after 2 weeks decreased to 200 mg daily. Maintain on a dysphagia #2 thin liquid diet CIR recommended for follow up therapy. Patient was admitted for comprehensive rehabilitation program  ROS Review of Systems  Unable to perform ROS: medical condition   Past Medical History  Diagnosis Date  . Hypertrophic cardiomyopathy ([place])   . Hyperlipidemia   . Hypertension   .  Tobacco use disorder   . Dyspnea   . Dizziness   . PAF (paroxysmal atrial fibrillation) ([place])   . SSS (sick sinus syndrome) ([place])   . CAD S/P percutaneous coronary angioplasty Aug 2016    Dx1 DES, no other significant dis  . Chronic anticoagulation     Eliquis   Past Surgical History  Procedure Laterality Date  . Back surgery x 2    . Rotator cuff surgery    . Insert / replace / remove pacemaker  2008    MDT  . Radiology with anesthesia N/A 05/20/2015    Procedure: RADIOLOGY WITH ANESTHESIA;  Surgeon: Medication Radiologist, MD;  Location: [facility];  Service: Radiology;  Laterality: N/A;  . Ablation  Nov 2016    [facility]   Family History  Problem Relation Age of Onset  . Cancer Sister 80   Social History:  reports that he has been smoking Cigarettes.  He has been smoking about 0.50 packs per day. He has never used smokeless tobacco. He reports that he does not drink alcohol or use illicit drugs. Allergies:  Allergies  Allergen Reactions  . Bee Venom Anaphylaxis   Medications Prior to Admission  Medication Sig Dispense Refill  . apixaban (ELIQUIS) 5 MG TABS tablet Take 2.5 mg by mouth 2 (two) times daily.    Marland Kitchen lisinopril (PRINIVIL,ZESTRIL) 2.5 MG tablet Take 2.5 mg by mouth daily.    . metoprolol (TOPROL-XL) 50 MG 24 hr tablet Take 25 mg by mouth daily.     . rosuvastatin (CRESTOR) 20 MG tablet Take 20 mg by mouth daily.        Home: Home Living Family/patient expects to be  discharged to:: Inpatient rehab Additional Comments: lives with son   Functional History: Prior Function Level of Independence: Independent  Functional Status:  Mobility: Bed Mobility Overal bed mobility: Needs Assistance Bed Mobility: Supine to Sit Supine to sit: Min guard Sit to supine: Min guard General bed mobility comments: pt up in recliner on arrival Transfers Overall transfer level: Needs assistance Equipment used: Rolling walker (2 wheeled) Transfers: Sit to/from Stand Sit to Stand: Min  assist, Mod assist Stand pivot transfers: Mod assist General transfer comment: pt continues with posterior bias; when pt has the back of his legs braced against the edge of bed, he required min assist to balance, visual and verbal cues for hand placement Ambulation/Gait Ambulation/Gait assistance: Mod assist Ambulation Distance (Feet): 120 Feet Assistive device: Rolling walker (2 wheeled) Gait Pattern/deviations: Step-through pattern, Trunk flexed, Decreased stride length General Gait Details: Multiple cues for more upright posture and remaining closer to RW and min assist for steadying; pt did not use a RW PTA; mod assist for balance with last 30 feet due to use of hand rail (no RW) for more challenge to balance, pt with poor R ankle DF during swing (also less R active DF then L when pt asked to tap toes in sitting) Gait velocity interpretation: Below normal speed for age/gender    ADL: ADL Overall ADL's : Needs assistance/impaired Eating/Feeding: Set up, Sitting Grooming: Wash/dry hands, Wash/dry face, Oral care, Standing, Minimal assistance Grooming Details (indicate cue type and reason): Pt requires min A for balance.  He required mod cues to sequence how to brush teeth.  He initially did not apply toothpaste to toothbrush, then proceeded to brush teeth.  Required therapist intervention to appley toothpaste.  He then required cues to spit and rinse mouth.  Pt also with difficulty sequencing washing face.  He applied soap to dry washcloth and proceeded to wash face, then made no attempt to rinse soap off face - mod cues provided.  Upper Body Bathing: Minimal assitance, Sitting Lower Body Bathing: Moderate assistance, Sit to/from stand Lower Body Bathing Details (indicate cue type and reason): Assist for thoroughness and sequencing  Upper Body Dressing : Minimal assistance, Sitting Lower Body Dressing: Minimal assistance, Sit to/from stand Lower Body Dressing Details (indicate cue type and  reason): able to don/doff socks  Toilet Transfer: Ambulation, Comfort height toilet, Grab bars, RW, Moderate assistance Toilet Transfer Details (indicate cue type and reason): mod A to maneuver in small locations  Toileting- Clothing Manipulation and Hygiene: Moderate assistance, Sit to/from stand Toileting - Clothing Manipulation Details (indicate cue type and reason): for thoroughness  Functional mobility during ADLs: Minimal assistance, Moderate assistance, Rolling walker General ADL Comments: Pt requries mod A to turn with walker and to negotiate small spaces such as the bathroom   Cognition: Cognition Overall Cognitive Status: Difficult to assess Arousal/Alertness: Awake/alert Orientation Level: Oriented to person, Oriented to time Attention: Focused, Sustained Focused Attention: Appears intact Sustained Attention: Appears intact Behaviors: Perseveration Cognition Arousal/Alertness: Awake/alert Behavior During Therapy: WFL for tasks assessed/performed Overall Cognitive Status: Difficult to assess Area of Impairment: Orientation, Attention, Problem solving, Safety/judgement, Following commands Orientation Level: Disoriented to, Place, Time, Situation Current Attention Level: Sustained Following Commands: Follows one step commands inconsistently Safety/Judgement: Decreased awareness of safety Problem Solving: Slow processing, Requires verbal cues, Difficulty sequencing, Requires tactile cues General Comments: Pt required mod verbal cues to sequence through familiar grooming tasks  Difficult to assess due to: Impaired communication  Physical Exam: Blood pressure 130/86, pulse 70, temperature 96.8  F (36 C), temperature source Oral, resp. rate 18, height 6' (1.829 m), weight 87 kg (191 lb 12.8 oz), SpO2 93 %. Physical Exam Constitutional: He appears well-developed and well-nourished.  HENT:   Head: Normocephalic and atraumatic.  Mouth/Throat: Oropharynx is clear and moist.    Eyes: Conjunctivae and EOM are normal. Pupils are equal, round, and reactive to light.  Neck: Normal range of motion. Neck supple.  Cardiovascular: Normal rate and regular rhythm. systolic murmur Respiratory: Appears to be working to breath, using accessory muscles at times but breath sounds normal. Denies respiratory distress. He has no wheezes, rales, or rhonchi.  GI: Soft. Bowel sounds are normal.  Musculoskeletal: He exhibits no edema or tenderness.  Neurological: He is alert.  Expressive > Receptive aphasia.  Unable to consistently follow simple motor commands. RUE resting tremors and RLE weakness noted.  Right central 7. Apraxic. Motor (limited due to aphasia): RUE: 4+-5/5 proximal to distal LUE: 5/5 proximal to distal LLE: 5/5 proximal to distal RLE: 4-/5 hip flexion, 4/5 knee extension, 2-2+/5 ankle dorsi/plantar flexion but is inconsistent with effort. Skin: Skin is warm and dry.  Psychiatric: He has a normal mood and affect. His behavior is normal. pleasant   Results for orders placed or performed during the hospital encounter of 05/20/15 (from the past 48 hour(s))  CBC     Status: Abnormal   Collection Time: 05/23/15  3:20 AM  Result Value Ref Range   WBC 17.4 (H) 4.0 - 10.5 K/uL   RBC 3.91 (L) 4.22 - 5.81 MIL/uL   Hemoglobin 11.9 (L) 13.0 - 17.0 g/dL   HCT 36.5 (L) 39.0 - 52.0 %   MCV 93.4 78.0 - 100.0 fL   MCH 30.4 26.0 - 34.0 pg   MCHC 32.6 30.0 - 36.0 g/dL   RDW 13.8 11.5 - 15.5 %   Platelets 107 (L) 150 - 400 K/uL    Comment: CONSISTENT WITH PREVIOUS RESULT  Basic metabolic panel     Status: Abnormal   Collection Time: 05/23/15  3:20 AM  Result Value Ref Range   Sodium 138 135 - 145 mmol/L   Potassium 3.4 (L) 3.5 - 5.1 mmol/L   Chloride 107 101 - 111 mmol/L   CO2 23 22 - 32 mmol/L   Glucose, Bld 129 (H) 65 - 99 mg/dL   BUN 11 6 - 20 mg/dL   Creatinine, Ser 0.89 0.61 - 1.24 mg/dL   Calcium 8.7 (L) 8.9 - 10.3 mg/dL   GFR calc non Af Amer >60 >60 mL/min    GFR calc Af Amer >60 >60 mL/min    Comment: (NOTE) The eGFR has been calculated using the CKD EPI equation. This calculation has not been validated in all clinical situations. eGFR's persistently <60 mL/min signify possible Chronic Kidney Disease.    Anion gap 8 5 - 15  TSH     Status: None   Collection Time: 05/23/15  3:20 AM  Result Value Ref Range   TSH 0.791 0.350 - 4.500 uIU/mL  Urinalysis with microscopic (not at [facility])     Status: Abnormal   Collection Time: 05/24/15  2:02 AM  Result Value Ref Range   Color, Urine AMBER (A) YELLOW    Comment: BIOCHEMICALS MAY BE AFFECTED BY COLOR   APPearance HAZY (A) CLEAR   Specific Gravity, Urine 1.018 1.005 - 1.030   pH 5.5 5.0 - 8.0   Glucose, UA NEGATIVE NEGATIVE mg/dL   Hgb urine dipstick LARGE (A) NEGATIVE   Bilirubin Urine  NEGATIVE NEGATIVE   Ketones, ur 40 (A) NEGATIVE mg/dL   Protein, ur 100 (A) NEGATIVE mg/dL   Nitrite NEGATIVE NEGATIVE   Leukocytes, UA SMALL (A) NEGATIVE   WBC, UA 6-30 0 - 5 WBC/hpf   RBC / HPF TOO NUMEROUS TO COUNT 0 - 5 RBC/hpf   Bacteria, UA FEW (A) NONE SEEN   Squamous Epithelial / LPF 0-5 (A) NONE SEEN   Casts HYALINE CASTS (A) NEGATIVE   Urine-Other MUCOUS PRESENT   CBC     Status: Abnormal   Collection Time: 05/24/15  5:47 AM  Result Value Ref Range   WBC 17.8 (H) 4.0 - 10.5 K/uL   RBC 3.83 (L) 4.22 - 5.81 MIL/uL   Hemoglobin 12.0 (L) 13.0 - 17.0 g/dL   HCT 35.4 (L) 39.0 - 52.0 %   MCV 92.4 78.0 - 100.0 fL   MCH 31.3 26.0 - 34.0 pg   MCHC 33.9 30.0 - 36.0 g/dL   RDW 13.9 11.5 - 15.5 %   Platelets 117 (L) 150 - 400 K/uL    Comment: CONSISTENT WITH PREVIOUS RESULT  Basic metabolic panel     Status: Abnormal   Collection Time: 05/24/15  5:47 AM  Result Value Ref Range   Sodium 137 135 - 145 mmol/L   Potassium 3.6 3.5 - 5.1 mmol/L   Chloride 104 101 - 111 mmol/L   CO2 24 22 - 32 mmol/L   Glucose, Bld 114 (H) 65 - 99 mg/dL   BUN 11 6 - 20 mg/dL   Creatinine, Ser 0.92 0.61 - 1.24 mg/dL    Calcium 8.6 (L) 8.9 - 10.3 mg/dL   GFR calc non Af Amer >60 >60 mL/min   GFR calc Af Amer >60 >60 mL/min    Comment: (NOTE) The eGFR has been calculated using the CKD EPI equation. This calculation has not been validated in all clinical situations. eGFR's persistently <60 mL/min signify possible Chronic Kidney Disease.    Anion gap 9 5 - 15   Ct Head Wo Contrast  05/23/2015  ADDENDUM REPORT: 05/23/2015 07:03 ADDENDUM: Results were were discussed by telephone on 05/23/2015 at 6:50 am to Dr. Leonel Ramsay , who verbally acknowledged these results. Electronically Signed   By: Jeannine Boga M.D.   On: 05/23/2015 07:03  05/23/2015  CLINICAL DATA:  Followup for acute stroke. EXAM: CT HEAD WITHOUT CONTRAST TECHNIQUE: Contiguous axial images were obtained from the base of the skull through the vertex without intravenous contrast. COMPARISON:  Prior CT from 05/21/2015. FINDINGS: Continued interval evolution of large anterior left MCA distribution infarct, now more well-defined and hypodense in appearance as compared to prior study. Increased mass effect extending towards the left basal ganglia with mild effacement of the frontal horn of the left lateral ventricle. No midline shift. Basilar cisterns remain patent. Scattered parenchymal hyperdensities now evident within the area of infarction, increased from prior study, and worrisome for hemorrhage. Atrophy with chronic small vessel ischemic disease again noted. No other infarct. No mass lesion. Scalp soft tissues demonstrate no acute abnormality. No acute abnormality about the globes and orbits. Mild scattered mucosal thickening within the ethmoidal air cells. Paranasal sinuses are otherwise clear. Minimal layering opacity within the posterior right mastoid air cells. Left mastoid air cells clear. Calvarium intact. IMPRESSION: 1. Continued interval evolution of large anterior left MCA distribution infarct, with increased localized mass effect without midline  shift. 2. New/increased scattered hyperdensity within the area of infarction, consistent with hemorrhage. I have paged to communicate these results  at the time of this dictation. An addendum will be made once communicated. Electronically Signed: By: Jeannine Boga M.D. On: 05/23/2015 06:32   Dg Chest Port 1 View  05/24/2015  CLINICAL DATA:  Shortness of breath with cough today. EXAM: PORTABLE CHEST 1 VIEW COMPARISON:  05/22/2015 and 05/21/2015. FINDINGS: Left subclavian pacemaker leads appear unchanged, although the tip of the ventricular lead is not visualized. There is stable cardiomegaly and aortic atherosclerosis. There is new pulmonary edema consistent with congestive heart failure. No pneumothorax or significant pleural effusion identified. The bones appear unchanged with postsurgical changes at the right humeral head. IMPRESSION: Cardiomegaly with new pulmonary edema consistent with congestive heart failure. Electronically Signed   By: Richardean Sale M.D.   On: 05/24/2015 08:01       Medical Problem List and Plan: 1.  Right hemiparesis/aphasia  secondary to left MCA infarct secondary to left ICA /MCA occlusion status post revascularization with mechanical thrombectomy   -begin CIR therapies 2.  DVT Prophylaxis/Anticoagulation: SCDs  3. Pain Management/history of multiple back surgeries: Tylenol as needed   -pain appears controlled at present 4. Dysphagia. Dysphagia #2 thin liquids. Follow-up speech therapy and advance as appropriate 5. Neuropsych: This patient is  capable of making decisions on his  own behalf. 6. Skin/Wound Care: Routine skin checks  7. Fluids/Electrolytes/Nutrition: Routine I&O with follow-up chemistries upon admit 8. Atrial fibrillation/hypertropic cardiomyopathy/pacemaker. Follow-up per cardiology services. Heart rate controlled at present. Continue amiodarone 400 mg twice daily for 5 days then decrease to 200 mg twice daily and then after 2 weeks decrease to 200  mg daily 9. Hypertension. Lisinopril 20 mg daily, Toprol 50 mg daily. Monitor with increased mobility 10. Hyperlipidemia. Crestor 11. Appearance of labored breathing: family reports that he appears this way at baseline. Lungs sound clear and oxygen sats have been in the 90's. Pt denies any shortness of breath.    Post Admission Physician Evaluation: 1. Functional deficits secondary  to left MCA infarct. 2. Patient is admitted to receive collaborative, interdisciplinary care between the physiatrist, rehab nursing staff, and therapy team. 3. Patient's level of medical complexity and substantial therapy needs in context of that medical necessity cannot be provided at a lesser intensity of care such as a SNF. 4. Patient has experienced substantial functional loss from his/her baseline which was documented above under the "Functional History" and "Functional Status" headings.  Judging by the patient's diagnosis, physical exam, and functional history, the patient has potential for functional progress which will result in measurable gains while on inpatient rehab.  These gains will be of substantial and practical use upon discharge  in facilitating mobility and self-care at the household level. 5. Physiatrist will provide 24 hour management of medical needs as well as oversight of the therapy plan/treatment and provide guidance as appropriate regarding the interaction of the two. 6. 24 hour rehab nursing will assist with bladder management, bowel management, safety, skin/wound care, disease management, medication administration, pain management and patient education  and help integrate therapy concepts, techniques,education, etc. 7. PT will assess and treat for/with: Lower extremity strength, range of motion, stamina, balance, functional mobility, safety, adaptive techniques and equipment, NMR, cognitive-perceptual rx, stroke education, ego support and postiive reinforcement.   Goals are: mod I to  supervision. 8. OT will assess and treat for/with: ADL's, functional mobility, safety, upper extremity strength, adaptive techniques and equipment, NMR, cognitive perceptual rx, stroke education, community reintegration.   Goals are: mod I to supervision. Therapy may proceed with showering this  patient. 9. SLP will assess and treat for/with: cognition, language, swallowing, communication, language.  Goals are: mod I with swallowing to min assist with language. 10. Case Management and Social Worker will assess and treat for psychological issues and discharge planning. 11. Team conference will be held weekly to assess progress toward goals and to determine barriers to discharge. 12. Patient will receive at least 3 hours of therapy per day at least 5 days per week. 13. ELOS: 7-10 days       14. Prognosis:  excellent     Meredith Staggers, MD, [facility] 05/24/2015   05/24/2015

## 2015-05-25 NOTE — Interval H&P Note (Signed)
Luis SoloKenneth Robinson was admitted yesterday to Inpatient Rehabilitation with the diagnosis of left MCA infarct.  The patient's history has been reviewed, patient examined, and there is no change in status.  Patient continues to be appropriate for intensive inpatient rehabilitation.  I have reviewed the patient's chart and labs.  Questions were answered to the patient's satisfaction. The PAPE has been reviewed and assessment remains appropriate.  Albert Hersch T 05/25/2015, 6:54 AM

## 2015-05-25 NOTE — Evaluation (Signed)
Physical Therapy Assessment and Plan  Patient Details  Name: Luis Robinson MRN: [id number] Date of Birth: [birth year]  PT Diagnosis: Abnormal posture, Abnormality of gait, Difficulty walking, Hemiparesis dominant, Impaired cognition, Impaired sensation and Muscle weakness Rehab Potential: Good ELOS: 10-14 days.    Today's Date: 05/25/2015 PT Individual Time: 1245-1345 PT Individual Time Calculation (min): 60 min    Problem List:  Patient Active Problem List   Diagnosis Date Noted  . Aphasia due to stroke 05/25/2015  . Right hemiparesis ([place]) 05/25/2015  . Left middle cerebral artery stroke ([place]) 05/24/2015  . SSS (sick sinus syndrome) ([place]) 05/22/2015  . Cardiac pacemaker in situ-MDT- implanted 2008 05/22/2015  . Chronic anticoagulation-Eliquis prior to adm 05/22/2015  . Atrial fibrillation with RVR ([place]) 05/22/2015  . CAD- S/P Dx1 DES Aug 2016 05/22/2015  . Dyslipidemia 05/22/2015  . Prediabetes   . Tobacco abuse   . Paroxysmal atrial fibrillation ([facility])   . Benign essential HTN   . History of back surgery   . Acute blood loss anemia   . Hypertrophic obstructive cardiomyopathy ([place])   . Tachypnea   . Leukocytosis   . Thrombocytopenia ([place])   . CVA due to occlusion of cerebral artery- treated with LMCA stent 05/20/15   . History of ETT   . Respiratory failure with hypoxia ([place])   . Stroke ([place])   . Stroke (cerebrum) ([place]) 05/20/2015    Past Medical History:  Past Medical History  Diagnosis Date  . Hypertrophic cardiomyopathy ([place])   . Hyperlipidemia   . Hypertension   . Tobacco use disorder   . Dyspnea   . Dizziness   . PAF (paroxysmal atrial fibrillation) ([place])   . SSS (sick sinus syndrome) ([place])   . CAD S/P percutaneous coronary angioplasty Aug 2016    Dx1 DES, no other significant dis  . Chronic anticoagulation     Eliquis   Past Surgical History:  Past Surgical History  Procedure Laterality Date  . Back surgery x 2    . Rotator cuff surgery    . Insert  / replace / remove pacemaker  2008    MDT  . Radiology with anesthesia N/A 05/20/2015    Procedure: RADIOLOGY WITH ANESTHESIA;  Surgeon: Medication Radiologist, MD;  Location: [place];  Service: Radiology;  Laterality: N/A;  . Ablation  Nov 2016    [facility]    Assessment & Plan Clinical Impression: Patient is a73 y.o. male with history of Hypertrophic cardiomyopathy/PAF with ablation/pacemaker maintained on Eliquis and followed by Dr. Bettina Gavia at [facility], HTN, tobacco abuse, back surgery 2 with antalgic gait who was admitted on 05/20/15 after fall with acute onset of right sided weakness with inability to speak. 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 Mild mass effect upon the left lateral ventricle. He was found ot have A fib with RVR and IV tPA not given as patient had taken his dose of eliquis already. He underwent cerebral angio with complete revascularization of occluded L-MCA with single pass Solitaire and IA tPA and complete revascularization of L-ICA occlusion with IV tPA /stent assisted angioplasty. He tolerated extubation post procedure and has had improvement in right sided weakness. Patient with resultant right inattention, right foot drop, kyphosis, severe paraphrasia's and perseveration affecting cognition/comprehension. Neurology consulted with follow-up and currently maintained on aspirin and Plavix for CVA prophylaxis..Plan to repeat CT of the head in 1 week if hemorrhage resolving resume Eliquis to Plavix and stop aspirin at that time. Patient  transferred to CIR on 05/24/2015 .   Patient currently requires min with mobility secondary to muscle weakness and muscle paralysis, impaired timing and sequencing and unbalanced muscle activation, decreased attention, decreased awareness, decreased problem solving, decreased safety awareness and delayed processing and decreased standing balance, decreased postural control and hemiplegia.  Prior to  hospitalization, patient was independent  with mobility and lived with Son in a House home.  Home access is 4Stairs to enter.  Patient will benefit from skilled PT intervention to maximize safe functional mobility, minimize fall risk and decrease caregiver burden for planned discharge home with 24 hour supervision.  Anticipate patient will benefit from follow up [place] at discharge.  PT - End of Session Activity Tolerance: Tolerates 30+ min activity without fatigue Endurance Deficit: Yes PT Assessment Rehab Potential (ACUTE/IP ONLY): Good Barriers to Discharge: Decreased caregiver support PT Patient demonstrates impairments in the following area(s): Balance;Behavior;Motor;Endurance;Perception;Safety;Sensory;Skin Integrity PT Transfers Functional Problem(s): Bed Mobility;Bed to Chair;Car;Furniture;Floor PT Locomotion Functional Problem(s): Ambulation;Wheelchair Mobility;Stairs PT Plan PT Intensity: Minimum of 1-2 x/day ,45 to 90 minutes PT Frequency: 5 out of 7 days PT Duration Estimated Length of Stay: 10-14 days.  PT Treatment/Interventions: Ambulation/gait training;Balance/vestibular training;Cognitive remediation/compensation;Community reintegration;Discharge planning;Disease management/prevention;DME/adaptive equipment instruction;Functional electrical stimulation;Functional mobility training;Neuromuscular re-education;Pain management;Patient/family education;Psychosocial support;Skin care/wound management;Splinting/orthotics;Stair training;Therapeutic Activities;Therapeutic Exercise;UE/LE Strength taining/ROM;UE/LE Coordination activities;Visual/perceptual remediation/compensation;Wheelchair propulsion/positioning PT Transfers Anticipated Outcome(s): Mod I  PT Locomotion Anticipated Outcome(s): Supervision with LRAD.  PT Recommendation Follow Up Recommendations: Home health PT Patient destination: Home Equipment Recommended: Rolling walker with 5" wheels;Wheelchair (measurements);Wheelchair  cushion (measurements)  Skilled Therapeutic Intervention Patient instructed in gait training over level surface for 67f with RW. Pt hand episode of incontinence of bowel and bladder. Bed mobility with roll L and R x 3 each direction with NT present for personal hygiene, Min A for roll and heavy use of bed rails. Patient performed bridge x 3 to don and doff pants. Sit<>supine with min A and cues for UE and LE positioning. WC mobility for 557fwith BUE propulsion with cues for increased use of RUE. Gait over Uneven surface for 10 ft with RW and min A. Car transfer with [place] RWH[place] Lakesith verbal instruction for improved technique and increased safety. Patient also instructed to pick object off floor x 2 with min A and RW for support. PT returned patient to room and left in [facility] quick release belt and call bell within reach.   PT Evaluation Precautions/Restrictions Precautions Precautions: Fall Precaution Comments: R foot drop  Restrictions Weight Bearing Restrictions: No General   Vital SignsTherapy Vitals Pulse Rate: 61 BP: (!) 90/47 mmHg Patient Position (if appropriate): Lying Pain Pain Assessment Pain Assessment: No/denies pain Home Living/Prior Functioning Home Living Living Arrangements: Children  Lives With: Son Prior Function Vocation: Retired Vision/Perception     Cognition Overall Cognitive Status: Impaired/Different from baseline Arousal/Alertness: Awake/alert Orientation Level: Oriented to person;Disoriented to place;Disoriented to time;Disoriented to situation Attention: Sustained Sustained Attention: Impaired Sustained Attention Impairment: Verbal basic Memory: Impaired Memory Impairment: Other (comment) Awareness Impairment: Intellectual impairment Problem Solving: Impaired Problem Solving Impairment: Verbal basic Executive Function:  (all impacted given lower level deficits) Behaviors: Restless;Impulsive;Perseveration Safety/Judgment: Impaired Comments:  further assessment of cognition warranted as language allows Sensation Sensation Light Touch: Appears Intact Hot/Cold: Appears Intact Proprioception: Impaired by gross assessment (R Ankle deficts. ) Coordination Gross Motor Movements are Fluid and Coordinated: No Fine Motor Movements are Fluid and Coordinated: No (mild right weakness) Finger Nose Finger Test: WFSt Mary'S Vincent Evans[facility] Incotor  Motor Motor: Hemiplegia Motor -  Skilled Clinical Observations: Decreased Activaiton of R LE with fucntional movement.   Mobility Bed Mobility Bed Mobility: Rolling Right;Rolling Left;Sit to Supine;Supine to Sit Rolling Right: 4: Min guard Rolling Left: 4: Min guard Supine to Sit: 4: Min assist Supine to Sit Details: Verbal cues for sequencing;Verbal cues for technique;Verbal cues for precautions/safety Sit to Supine: 4: Min assist Sit to Supine - Details: Verbal cues for gait pattern;Verbal cues for safe use of DME/AE;Manual facilitation for weight shifting;Manual facilitation for placement Transfers Transfers: Yes Sit to Stand: 4: Min assist Sit to Stand Details: Verbal cues for sequencing;Verbal cues for technique;Verbal cues for precautions/safety Stand to Sit: 4: Min assist Stand to Sit Details (indicate cue type and reason): Verbal cues for gait pattern;Verbal cues for technique;Verbal cues for precautions/safety;Verbal cues for sequencing Stand Pivot Transfers: 4: Min assist Stand Pivot Transfer Details: Verbal cues for sequencing;Verbal cues for technique;Verbal cues for precautions/safety Locomotion  Ambulation Ambulation: Yes Ambulation/Gait Assistance: 4: Min assist Ambulation Distance (Feet): 80 Feet Assistive device: Rolling walker Ambulation/Gait Assistance Details: Verbal cues for gait pattern;Verbal cues for safe use of DME/AE;Verbal cues for technique;Verbal cues for precautions/safety Gait Gait: Yes Gait Pattern: Poor foot clearance - right;Right foot flat;Decreased stance time -  right;Decreased step length - right Stairs / Additional Locomotion Stairs: Yes Stairs Assistance: 4: Min assist Stair Management Technique: Two rails Number of Stairs: 8 Height of Stairs: 4 Wheelchair Mobility Wheelchair Mobility: Yes Wheelchair Assistance: 4: Min assist Wheelchair Parts Management: Needs assistance Distance: 16f  Trunk/Postural Assessment  Cervical Assessment Cervical Assessment: Within Functional Limits Thoracic Assessment Thoracic Assessment: Within Functional Limits Lumbar Assessment Lumbar Assessment: Within Functional Limits Postural Control Postural Control: Deficits on evaluation Righting Reactions: delayed.   Balance Balance Balance Assessed: Yes Dynamic Sitting Balance Dynamic Sitting - Balance Support: During functional activity Dynamic Sitting - Level of Assistance: 6: Modified independent (Device/Increase time) Static Standing Balance Static Standing - Balance Support: Bilateral upper extremity supported Static Standing - Level of Assistance: 4: Min assist Dynamic Standing Balance Dynamic Standing - Balance Support: During functional activity Dynamic Standing - Level of Assistance: 4: Min assist Extremity Assessment      RLE Assessment RLE Assessment: Exceptions to WMor[facility] Surgical CenterRLE AROM (degrees) RLE Overall AROM Comments: WFL for hip and knee movement. unable to generate DF and only trace PF at the ankle  RLE Strength RLE Overall Strength Comments: 4/5 fo rhip and knee. 1/5 for ankle.  LLE Assessment LLE Assessment: Within Functional Limits (4/5 overall. )   See Function Navigator for Current Functional Status.   Refer to Care Plan for Long Term Goals  Recommendations for other services: None  Discharge Criteria: Patient will be discharged from PT if patient refuses treatment 3 consecutive times without medical reason, if treatment goals not met, if there is a change in medical status, if patient makes no progress towards goals or if  patient is discharged from hospital.  The above assessment, treatment plan, treatment alternatives and goals were discussed and mutually agreed upon: by patient  ALorie Phenix4/02/2015, 5:32 PM

## 2015-05-26 ENCOUNTER — Inpatient Hospital Stay (HOSPITAL_COMMUNITY): Payer: Medicare HMO | Admitting: Physical Therapy

## 2015-05-26 MED ORDER — METOPROLOL TARTRATE 12.5 MG HALF TABLET
12.5000 mg | ORAL_TABLET | Freq: Two times a day (BID) | ORAL | Status: DC
  Administered 2015-05-26 – 2015-05-27 (×2): 12.5 mg via ORAL
  Filled 2015-05-26 (×3): qty 1

## 2015-05-26 MED ORDER — FUROSEMIDE 20 MG PO TABS
20.0000 mg | ORAL_TABLET | Freq: Every day | ORAL | Status: DC
  Administered 2015-05-26 – 2015-06-05 (×11): 20 mg via ORAL
  Filled 2015-05-26 (×11): qty 1

## 2015-05-26 MED ORDER — LISINOPRIL 10 MG PO TABS
10.0000 mg | ORAL_TABLET | Freq: Once | ORAL | Status: AC
  Administered 2015-05-26: 10 mg via ORAL
  Filled 2015-05-26: qty 1

## 2015-05-26 NOTE — Progress Notes (Signed)
Patient had 3 loose incontinent stools. Notified MD. Orders given for C.Diff test. Enteric precautions initiated, charge nurse notified. Specimen collected and sent to lab. Reported to oncoming RN.

## 2015-05-26 NOTE — Progress Notes (Signed)
Call from lab, Joyce GrossKay, that stool specimen rejected, stool too formed to run test. Martina SinnerMurray, Vermon Grays A

## 2015-05-26 NOTE — Progress Notes (Signed)
Physical Therapy Session Note  Patient Details  Name: Luis SoloKenneth Robinson MRN: [id number] Date of Birth: August 16, 1940  Today's Date: 05/26/2015 PT Individual Time: 0800-0830 PT Individual Time Calculation (min): 30 min   Short Term Goals: Week 1:  PT Short Term Goal 1 (Week 1): Patient will performed bed mobility at mod I level.  PT Short Term Goal 2 (Week 1): Patient will perform bed<>chair and sit<>stand at suprevision level.  PT Short Term Goal 3 (Week 1): Patient will ambulate 150 with min A using LRAD   Skilled Therapeutic Interventions/Progress Updates:  Pt was seen bedside in the am. Pt transferred supine to edge of bed with side rail and S with increased time. Pt transferred sit to stand with rolling walker and min guard. Pt connected to portable O2 at 2 liters. Pt ambulated 140 feet with rolling walker and min guard with verbal cues. Pt performed multiple sit to stand transfers with rolling walker and min guard with verbal cues. Pt performed cone taps 3 sets x 10 reps for LE strengthening. Pt ambulated 180 feet with rolling walker and min guard with verbal cues. Pt left sitting up in w/c with quick release belt in place and call bell within reach.   Therapy Documentation Precautions:  Precautions Precautions: Fall Precaution Comments: R foot drop  Restrictions Weight Bearing Restrictions: No General:   Pain: No c/o pain.   See Function Navigator for Current Functional Status.   Therapy/Group: Individual Therapy  Rayford HalstedMitchell, Luis Robinson 05/26/2015, 12:31 PM

## 2015-05-26 NOTE — Plan of Care (Signed)
Problem: RH BOWEL ELIMINATION Goal: RH STG MANAGE BOWEL WITH ASSISTANCE STG Manage Bowel with min Assistance.  Outcome: Not Progressing 3 X loose stools, rule out C. Diff  Problem: RH SAFETY Goal: RH STG ADHERE TO SAFETY PRECAUTIONS W/ASSISTANCE/DEVICE STG Adhere to Safety Precautions With Assistance/Device.  Outcome: Not Progressing Patient unable to remember safety precautions, un-does pink belt and attempts to get up without calling despite frequent reinforcement.

## 2015-05-26 NOTE — Progress Notes (Signed)
75 y.o. male with history of Hypertrophic cardiomyopathy/PAF with ablation/pacemaker maintained on Eliquis and followed by Dr. Bettina Gavia at [facility], HTN, tobacco abuse, back surgery 2 with antalgic gait who was admitted on 05/20/15 after fall with acute onset of right sided weakness with inability to speak. 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 Mild mass effect upon the left lateral ventricle. He was found ot have A fib with RVR and IV tPA not given as patient had taken his dose of eliquis already. He underwent cerebral angio with complete revascularization of occluded L-MCA with single pass Solitaire and IA tPA and complete revascularization of L-ICA occlusion with IV tPA /stent assisted angioplasty. He tolerated extubation post procedure and has had improvement in right sided weakness. Patient with resultant right inattention, right foot drop, kyphosis, severe paraphrasia's and perseveration affecting cognition/comprehension. Neurology consulted with follow-up and currently maintained on aspirin and Plavix for CVA prophylaxis..Plan to repeat CT of the head in 1 week if hemorrhage resolving resume Eliquis to Plavix and stop aspirin at that time  Subjective/Complaints: Pt denies dizziness in therapy ROS limited aphasia Objective: Vital Signs: Blood pressure 105/67, pulse 61, temperature 98.8 F (37.1 C), temperature source Oral, resp. rate 17, weight 82.736 kg (182 lb 6.4 oz), SpO2 92 %. Dg Chest Port 1 View  05/24/2015  CLINICAL DATA:  Shortness of breath with cough today. EXAM: PORTABLE CHEST 1 VIEW COMPARISON:  05/22/2015 and 05/21/2015. FINDINGS: Left subclavian pacemaker leads appear unchanged, although the tip of the ventricular lead is not visualized. There is stable cardiomegaly and aortic atherosclerosis. There is new pulmonary edema consistent with congestive heart failure. No pneumothorax or significant pleural effusion identified. The bones  appear unchanged with postsurgical changes at the right humeral head. IMPRESSION: Cardiomegaly with new pulmonary edema consistent with congestive heart failure. Electronically Signed   By: Richardean Sale M.D.   On: 05/24/2015 08:01   Results for orders placed or performed during the hospital encounter of 05/20/15 (from the past 72 hour(s))  Urinalysis with microscopic (not at [facility])     Status: Abnormal   Collection Time: 05/24/15  2:02 AM  Result Value Ref Range   Color, Urine AMBER (A) YELLOW    Comment: BIOCHEMICALS MAY BE AFFECTED BY COLOR   APPearance HAZY (A) CLEAR   Specific Gravity, Urine 1.018 1.005 - 1.030   pH 5.5 5.0 - 8.0   Glucose, UA NEGATIVE NEGATIVE mg/dL   Hgb urine dipstick LARGE (A) NEGATIVE   Bilirubin Urine NEGATIVE NEGATIVE   Ketones, ur 40 (A) NEGATIVE mg/dL   Protein, ur 100 (A) NEGATIVE mg/dL   Nitrite NEGATIVE NEGATIVE   Leukocytes, UA SMALL (A) NEGATIVE   WBC, UA 6-30 0 - 5 WBC/hpf   RBC / HPF TOO NUMEROUS TO COUNT 0 - 5 RBC/hpf   Bacteria, UA FEW (A) NONE SEEN   Squamous Epithelial / LPF 0-5 (A) NONE SEEN   Casts HYALINE CASTS (A) NEGATIVE   Urine-Other MUCOUS PRESENT   CBC     Status: Abnormal   Collection Time: 05/24/15  5:47 AM  Result Value Ref Range   WBC 17.8 (H) 4.0 - 10.5 K/uL   RBC 3.83 (L) 4.22 - 5.81 MIL/uL   Hemoglobin 12.0 (L) 13.0 - 17.0 g/dL   HCT 35.4 (L) 39.0 - 52.0 %   MCV 92.4 78.0 - 100.0 fL   MCH 31.3 26.0 - 34.0 pg   MCHC 33.9 30.0 - 36.0 g/dL   RDW 13.9  11.5 - 15.5 %   Platelets 117 (L) 150 - 400 K/uL    Comment: CONSISTENT WITH PREVIOUS RESULT  Basic metabolic panel     Status: Abnormal   Collection Time: 05/24/15  5:47 AM  Result Value Ref Range   Sodium 137 135 - 145 mmol/L   Potassium 3.6 3.5 - 5.1 mmol/L   Chloride 104 101 - 111 mmol/L   CO2 24 22 - 32 mmol/L   Glucose, Bld 114 (H) 65 - 99 mg/dL   BUN 11 6 - 20 mg/dL   Creatinine, Ser 0.92 0.61 - 1.24 mg/dL   Calcium 8.6 (L) 8.9 - 10.3 mg/dL   GFR calc non Af  Amer >60 >60 mL/min   GFR calc Af Amer >60 >60 mL/min    Comment: (NOTE) The eGFR has been calculated using the CKD EPI equation. This calculation has not been validated in all clinical situations. eGFR's persistently <60 mL/min signify possible Chronic Kidney Disease.    Anion gap 9 5 - 15      General: No acute distress Mood and affect are appropriate Heart: Regular rate and rhythm no rubs murmurs or extra sounds Lungs: Clear to auscultation, breathing unlabored, no rales or wheezes Abdomen: Positive bowel sounds, soft nontender to palpation, nondistended Extremities: No clubbing, cyanosis, or edema Skin: No evidence of breakdown, no evidence of rash Neurologic: Cranial nerves II through XII intact, motor strength is 5/5 in bilateral deltoid, bicep, tricep, grip, hip flexor, knee extensors, ankle dorsiflexor and plantar flexor Sensory exam cannot assess due to aphasia Good receptive but imparied exp language Musculoskeletal: Full range of motion in all 4 extremities. No joint swelling   Assessment/Plan: 1. Functional deficits secondary to Left MCA infarct with aphasia and gait d/o which require 3+ hours per day of interdisciplinary therapy in a comprehensive inpatient rehab setting. Physiatrist is providing close team supervision and 24 hour management of active medical problems listed below. Physiatrist and rehab team continue to assess barriers to discharge/monitor patient progress toward functional and medical goals. FIM: Function - Bathing Position: Wheelchair/chair at sink Body parts bathed by patient: Right arm, Left arm, Chest, Abdomen, Front perineal area, Buttocks, Right upper leg, Left upper leg Body parts bathed by helper: Right lower leg, Left lower leg, Back Assist Level: Touching or steadying assistance(Pt > 75%)  Function- Upper Body Dressing/Undressing What is the patient wearing?: Pull over shirt/dress Pull over shirt/dress - Perfomed by patient:  Thread/unthread right sleeve, Thread/unthread left sleeve, Put head through opening, Pull shirt over trunk Assist Level: Set up Set up : To obtain clothing/put away Function - Lower Body Dressing/Undressing What is the patient wearing?: Pants, Non-skid slipper socks Position: Wheelchair/chair at sink Pants- Performed by patient: Pull pants up/down, Fasten/unfasten pants Pants- Performed by helper: Thread/unthread right pants leg, Thread/unthread left pants leg Non-skid slipper socks- Performed by helper: Don/doff right sock, Don/doff left sock Assist for footwear: Dependant Assist for lower body dressing: Touching or steadying assistance (Pt > 75%)  Function - Toileting Toileting activity did not occur: No continent bowel/bladder event  Function - Air cabin crew transfer activity did not occur: Safety/medical concerns Toilet transfer assistive device: Grab bar Assist level to toilet: Touching or steadying assistance (Pt > 75%) Assist level from toilet: Touching or steadying assistance (Pt > 75%)  Function - Chair/bed transfer Chair/bed transfer method: Stand pivot Chair/bed transfer assist level: Touching or steadying assistance (Pt > 75%) Chair/bed transfer assistive device: Armrests, Walker Chair/bed transfer details: Verbal cues for sequencing, Verbal  cues for technique, Verbal cues for precautions/safety, Verbal cues for safe use of DME/AE  Function - Locomotion: Wheelchair Will patient use wheelchair at discharge?: Yes Type: Manual Max wheelchair distance: 71f Assist Level: Touching or steadying assistance (Pt > 75%) Assist Level: Touching or steadying assistance (Pt > 75%) Wheel 150 feet activity did not occur: Safety/medical concerns Turns around,maneuvers to table,bed, and toilet,negotiates 3% grade,maneuvers on rugs and over doorsills: No Function - Locomotion: Ambulation Assistive device: Walker-rolling Max distance: 80 ft.  Assist level: Touching or  steadying assistance (Pt > 75%) Assist level: Touching or steadying assistance (Pt > 75%) Assist level: Touching or steadying assistance (Pt > 75%) Walk 150 feet activity did not occur: Safety/medical concerns Assist level: Touching or steadying assistance (Pt > 75%)  Function - Comprehension Comprehension: Auditory Comprehension assist level: Understands basic 25 - 49% of the time/ requires cueing 50 - 75% of the time  Function - Expression Expression: Verbal Expression assist level: Expresses basic 25 - 49% of the time/requires cueing 50 - 75% of the time. Uses single words/gestures.  Function - Social Interaction Social Interaction assist level: Interacts appropriately 50 - 74% of the time - May be physically or verbally inappropriate.  Function - Problem Solving Problem solving assist level: Solves basic 25 - 49% of the time - needs direction more than half the time to initiate, plan or complete simple activities  Function - Memory Memory assist level: Recognizes or recalls less than 25% of the time/requires cueing greater than 75% of the time Patient normally able to recall (first 3 days only): None of the above  Medical Problem List and Plan: 1.  Right hemiparesis/aphasia  secondary to left MCA infarct secondary to left ICA /MCA occlusion status post revascularization with mechanical thrombectomy               -cont CIR  2.  DVT Prophylaxis/Anticoagulation: SCDs   3. Pain Management/history of multiple back surgeries: Tylenol as needed               -pain appears controlled at present 4. Dysphagia. Dysphagia #2 thin liquids. Follow-up speech therapy and advance as appropriate 5. Neuropsych: This patient is  capable of making decisions on his  own behalf. 6. Skin/Wound Care: Routine skin checks   7. Fluids/Electrolytes/Nutrition: Routine I&O with follow-up chemistries upon admit, 100% meal intake, po fluids [id number] 8. Atrial fibrillation/hypertropic cardiomyopathy/pacemaker.  Follow-up per cardiology services. Heart rate controlled at present. Continue amiodarone 400 mg twice daily for 5 days then decrease to 200 mg twice daily and then after 2 weeks decrease to 200 mg daily 9. Hypertension. Lisinopril 20 mg daily, Toprol 50 mg daily, needs pills crushed, swich toprol xl to metoprolol. Monitor with increased mobility 10. Hyperlipidemia. Crestor 11. Appearance of labored breathing: family reports that he appears this way at baseline. Improved after IV lasix, now on po lasix, BPs getting soft reduce to 2046m2.  Aphasia- SLP eval  LOS (Days) 2 A FACE TO FACE EVALUATION WAS PERFORMED  KIRSTEINS,ANDREW E 05/26/2015, 6:24 AM

## 2015-05-27 ENCOUNTER — Inpatient Hospital Stay (HOSPITAL_COMMUNITY): Payer: Medicare HMO | Admitting: Speech Pathology

## 2015-05-27 ENCOUNTER — Inpatient Hospital Stay (HOSPITAL_COMMUNITY): Payer: Medicare HMO | Admitting: Occupational Therapy

## 2015-05-27 ENCOUNTER — Inpatient Hospital Stay (HOSPITAL_COMMUNITY): Payer: Medicare HMO | Admitting: Physical Therapy

## 2015-05-27 LAB — COMPREHENSIVE METABOLIC PANEL
ALT: 31 U/L (ref 17–63)
ANION GAP: 8 (ref 5–15)
AST: 32 U/L (ref 15–41)
Albumin: 2.6 g/dL — ABNORMAL LOW (ref 3.5–5.0)
Alkaline Phosphatase: 51 U/L (ref 38–126)
BUN: 21 mg/dL — ABNORMAL HIGH (ref 6–20)
CHLORIDE: 104 mmol/L (ref 101–111)
CO2: 29 mmol/L (ref 22–32)
Calcium: 8.8 mg/dL — ABNORMAL LOW (ref 8.9–10.3)
Creatinine, Ser: 0.97 mg/dL (ref 0.61–1.24)
Glucose, Bld: 105 mg/dL — ABNORMAL HIGH (ref 65–99)
POTASSIUM: 3.7 mmol/L (ref 3.5–5.1)
SODIUM: 141 mmol/L (ref 135–145)
Total Bilirubin: 0.6 mg/dL (ref 0.3–1.2)
Total Protein: 5.7 g/dL — ABNORMAL LOW (ref 6.5–8.1)

## 2015-05-27 LAB — CBC WITH DIFFERENTIAL/PLATELET
Basophils Absolute: 0 10*3/uL (ref 0.0–0.1)
Basophils Relative: 0 %
EOS ABS: 0.2 10*3/uL (ref 0.0–0.7)
EOS PCT: 2 %
HCT: 39.5 % (ref 39.0–52.0)
Hemoglobin: 13.1 g/dL (ref 13.0–17.0)
LYMPHS ABS: 1.1 10*3/uL (ref 0.7–4.0)
Lymphocytes Relative: 12 %
MCH: 31 pg (ref 26.0–34.0)
MCHC: 33.2 g/dL (ref 30.0–36.0)
MCV: 93.6 fL (ref 78.0–100.0)
MONO ABS: 1 10*3/uL (ref 0.1–1.0)
Monocytes Relative: 11 %
Neutro Abs: 6.6 10*3/uL (ref 1.7–7.7)
Neutrophils Relative %: 75 %
PLATELETS: 155 10*3/uL (ref 150–400)
RBC: 4.22 MIL/uL (ref 4.22–5.81)
RDW: 13.3 % (ref 11.5–15.5)
WBC: 8.9 10*3/uL (ref 4.0–10.5)

## 2015-05-27 MED ORDER — LISINOPRIL 10 MG PO TABS
10.0000 mg | ORAL_TABLET | Freq: Every day | ORAL | Status: DC
Start: 2015-05-28 — End: 2015-05-31
  Administered 2015-05-28 – 2015-05-29 (×2): 10 mg via ORAL
  Filled 2015-05-27 (×3): qty 1

## 2015-05-27 MED ORDER — ENSURE ENLIVE PO LIQD
237.0000 mL | Freq: Every day | ORAL | Status: DC
  Administered 2015-05-29 – 2015-06-04 (×7): 237 mL via ORAL

## 2015-05-27 MED ORDER — METOPROLOL TARTRATE 12.5 MG HALF TABLET
12.5000 mg | ORAL_TABLET | Freq: Two times a day (BID) | ORAL | Status: DC
  Administered 2015-05-27 – 2015-06-05 (×14): 12.5 mg via ORAL
  Filled 2015-05-27 (×18): qty 1

## 2015-05-27 NOTE — Progress Notes (Signed)
Patient information reviewed and entered into eRehab system by Elvena Oyer, RN, CRRN, PPS Coordinator.  Information including medical coding and functional independence measure will be reviewed and updated through discharge.     Per nursing patient was given "Data Collection Information Summary for Patients in Inpatient Rehabilitation Facilities with attached "Privacy Act Statement-Health Care Records" upon admission.  

## 2015-05-27 NOTE — Progress Notes (Signed)
Initial Nutrition Assessment  DOCUMENTATION CODES:   Not applicable  INTERVENTION:  Provide Ensure Enlive po once daily, each supplement provides 350 kcal and 20 grams of protein.  Encourage adequate PO intake.   NUTRITION DIAGNOSIS:   Increased nutrient needs related to  (therapy) as evidenced by estimated needs.  GOAL:   Patient will meet greater than or equal to 90% of their needs  MONITOR:   PO intake, Supplement acceptance, Weight trends, Labs, I & O's  REASON FOR ASSESSMENT:   Malnutrition Screening Tool    ASSESSMENT:   75 y.o. male with history of Hypertrophic cardiomyopathy/PAF with ablation/pacemaker maintained on Eliquis and [facility], HTN, tobacco abuse, back surgery 2 with antalgic gait who was admitted on 05/20/15 after fall with acute onset of right sided weakness with inability to speak. 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   Meal completion has been 100%. Pt reports having a good appetite currently and PTA with no other difficulties. Pt currently has Ensure ordered and has been consuming them. Per previous RN notes, pt with loose stool. Spoke with RN today and she reports Ensure is not related to his stools, as he no longer has loose stools. Usual weight loss unknown to patient, however per Epic weight records pt with a 5.7% weight loss in 1 week. Question weight accuracy? RD to modify Ensure to once daily as pt with 100% po intake.   Nutrition-Focused physical exam completed. Findings are no fat depletion, mild to moderate muscle depletion, and no edema.   Labs and medications reviewed.   Diet Order:  DIET DYS 3 Room service appropriate?: Yes; Fluid consistency:: Thin  Skin:  Reviewed, no issues  Last BM:  4/2  Height:   Ht Readings from Last 1 Encounters:  05/21/15 6' (1.829 m)    Weight:   Wt Readings from Last 1 Encounters:  05/27/15 180 lb 8.9 oz (81.9 kg)     Ideal Body Weight:  80.9 kg  BMI:  Body mass index is 24.48 kg/(m^2).  Estimated Nutritional Needs:   Kcal:  1900-2100  Protein:  100-110 grams  Fluid:  1.9 - 2.1 L/day  EDUCATION NEEDS:   No education needs identified at this time  Roslyn SmilingStephanie Danetra Glock, MS, RD, LDN Pager # [phone] After hours/ weekend pager # [phone]

## 2015-05-27 NOTE — Progress Notes (Signed)
Speech Language Pathology Daily Session Note  Patient Details  Name: Luis Robinson MRN: [id number] Date of Birth: [birth year]  Today's Date: 05/27/2015 SLP Individual Time: 1133-1200; [id number] SLP Individual Time Calculation (min): 27 min; 58 min   Short Term Goals: Week 1: SLP Short Term Goal 1 (Week 1): Pt demonstrate ability to answer simple Y/N questions  accurately with mod A. SLP Short Term Goal 2 (Week 1): Pt demonstrate reading comprehension at the single word level with min A. SLP Short Term Goal 3 (Week 1): Pt demonstrate O x 4 with mod A. SLP Short Term Goal 4 (Week 1): Pt demonstrate ability to follow 2-step directives with mod A.  SLP Short Term Goal 5 (Week 1): Pt demonstrate object naming to identifiable approximation with mod A. SLP Short Term Goal 6 (Week 1): Pt tolerate trials of regular texture without oral residue with min A.  Skilled Therapeutic Interventions:  Pt was seen for skilled ST targeting communication goals.  SLP facilitated the session with a confrontational naming task targeting naming of familiar objects.  Pt was able to name objects for 80% accuracy, which improved to 90% accuracy with min-mod assist verbal cues for awareness of verbal errors.  Pt was also able to match word to object from a field of three for 70% accuracy, which improved to 100% accuracy with mod assist verbal cues to monitor and correct errors.  Pt was left sitting at edge of bed with call bell within reach and bed alarm set.  Continue per current plan of care.   Session 2:  Pt was seen for skilled ST targeting communication goals.  Pt was able to verbally answer personal yes/no question at the phrase/sentence level with min assist for awareness of verbal errors; however, as pt's length of utterance increased he had increased verbal errors with decreased awareness of errors, requiring up to max assist to monitor and correct.  SLP facilitated the session with a picture description of task  targeting phrase/sentence level production and naming of objects in pictures.  Pt was able to describe objects, people, and actions in pictures in phrases with mod assist question cues.  Pt was returned to room and left at edge of bed with bed alarm set and call bell within reach.   Function:  Eating Eating                 Cognition Comprehension Comprehension assist level: Understands basic 75 - 89% of the time/ requires cueing 10 - 24% of the time  Expression   Expression assist level: Expresses basic 50 - 74% of the time/requires cueing 25 - 49% of the time. Needs to repeat parts of sentences.  Social Interaction Social Interaction assist level: Interacts appropriately 75 - 89% of the time - Needs redirection for appropriate language or to initiate interaction.  Problem Solving Problem solving assist level: Solves basic 50 - 74% of the time/requires cueing 25 - 49% of the time  Memory Memory assist level: Recognizes or recalls 25 - 49% of the time/requires cueing 50 - 75% of the time    Pain Pain Assessment Pain Assessment: No/denies pain  Therapy/Group: Individual Therapy  Mikaelyn Arthurs, Melanee SpryNicole L 05/27/2015, 4:29 PM

## 2015-05-27 NOTE — Progress Notes (Signed)
Physical Therapy Note  Patient Details  Name: Thereasa SoloKenneth Picone MRN: [id number] Date of Birth: [birth year] Today's Date: 05/27/2015    Time: 800-855 55 minutes  1:1 No c/o pain.  Pt performed standing balance with min A, sitting balance with supervision for donning pants and shoes with set up assistance.  Gait without AD with min A 150' with steppage gait noted on R LE.  PT applied ace wrap to assist with DF on R LE. Pt able to gait with improved symmetry and decreased steppage gait. Pt gait throughout unit in controlled and home environment including sideways and backward gait with min A for balance, continues with slight antalgic gait due to R LE weakness.  Standing NMR with focus on improving balance reactions and speed of movement with tapping and stepping up, weight shifts and squats. Pt performed with min manual facilitation and tactile cues to activate R LE mm.  Standing horseshoe game with min A for standing balance.  Pt able to perform full session on room air with spO2 >95% throughout.   DONAWERTH,KAREN 05/27/2015, 8:54 AM

## 2015-05-27 NOTE — Plan of Care (Signed)
Problem: RH BOWEL ELIMINATION Goal: RH STG MANAGE BOWEL WITH ASSISTANCE STG Manage Bowel with min Assistance.  Outcome: Not Progressing Incontinent of bowel requiring total care.  Problem: RH BLADDER ELIMINATION Goal: RH STG MANAGE BLADDER WITH ASSISTANCE STG Manage Bladder With min Assistance  Outcome: Not Progressing [facility].  Problem: RH SAFETY Goal: RH STG ADHERE TO SAFETY PRECAUTIONS W/ASSISTANCE/DEVICE STG Adhere to Safety Precautions With Assistance/Device.  Outcome: Not Progressing Removes QRB to out of WC without assistance.

## 2015-05-27 NOTE — Care Management Note (Signed)
Inpatient Rehabilitation Center Individual Statement of Services  Patient Name:  Luis SoloKenneth Robinson  Date:  05/27/2015  Welcome to the Inpatient Rehabilitation Center.  Our goal is to provide you with an individualized program based on your diagnosis and situation, designed to meet your specific needs.  With this comprehensive rehabilitation program, you will be expected to participate in at least 3 hours of rehabilitation therapies Monday-Friday, with modified therapy programming on the weekends.  Your rehabilitation program will include the following services:  Physical Therapy (PT), Occupational Therapy (OT), Speech Therapy (ST), 24 hour per day rehabilitation nursing, Therapeutic Recreaction (TR), Case Management (Social Worker), Rehabilitation Medicine, Nutrition Services and Pharmacy Services  Weekly team conferences will be held on Wednesday to discuss your progress.  Your Social Worker will talk with you frequently to get your input and to update you on team discussions.  Team conferences with you and your family in attendance may also be held.  Expected length of stay: 8-12 days  anticipated outcome: supervision with cueing  Depending on your progress and recovery, your program may change. Your Social Worker will coordinate services and will keep you informed of any changes. Your Social Worker's name and contact numbers are listed  below.  The following services may also be recommended but are not provided by the Inpatient Rehabilitation Center:   Driving Evaluations  Home Health Rehabiltiation Services  Outpatient Rehabilitation Services  Arrangements will be made to provide these services after discharge if needed.  Arrangements include referral to agencies that provide these services.  Your insurance has been verified to be:  [employer] Your primary doctor is:  Alinda Deemamela Penner  Pertinent information will be shared with your doctor and your insurance company.  Social Worker:   Dossie DerBecky Geran Haithcock, SW [phone] or [phone]) [phone]  Information discussed with and copy given to patient by: Lucy Chrisupree, Kenry Daubert G, 05/27/2015, 10:49 AM

## 2015-05-27 NOTE — Progress Notes (Signed)
75 y.o. male with history of Hypertrophic cardiomyopathy/PAF with ablation/pacemaker maintained on Eliquis and followed by Dr. Dulce Sellar at [facility], HTN, tobacco abuse, back surgery 2 with antalgic gait who was admitted on 05/20/15 after fall with acute onset of right sided weakness with inability to speak. 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 Mild mass effect upon the left lateral ventricle. He was found ot have A fib with RVR and IV tPA not given as patient had taken his dose of eliquis already. He underwent cerebral angio with complete revascularization of occluded L-MCA with single pass Solitaire and IA tPA and complete revascularization of L-ICA occlusion with IV tPA /stent assisted angioplasty. He tolerated extubation post procedure and has had improvement in right sided weakness. Patient with resultant right inattention, right foot drop, kyphosis, severe paraphrasia's and perseveration affecting cognition/comprehension. Neurology consulted with follow-up and currently maintained on aspirin and Plavix for CVA prophylaxis..Plan to repeat CT of the head in 1 week if hemorrhage resolving resume Eliquis to Plavix and stop aspirin at that time  Subjective/Complaints: "today is first Monday of the year"  ROS limited aphasia, denies abd pain , N/V, -SOB Objective: Vital Signs: Blood pressure 138/78, pulse 70, temperature 97.9 F (36.6 C), temperature source Oral, resp. rate 17, weight 81.9 kg (180 lb 8.9 oz), SpO2 98 %. No results found. No results found for this or any previous visit (from the past 72 hour(s)).    General: No acute distress Mood and affect are appropriate Heart: Regular rate and rhythm no rubs murmurs or extra sounds Lungs: Clear to auscultation, breathing unlabored, no rales or wheezes Abdomen: Positive bowel sounds, soft nontender to palpation, nondistended Extremities: No clubbing, cyanosis, or edema Skin: No evidence of  breakdown, no evidence of rash Neurologic: Cranial nerves II through XII intact, motor strength is 5/5 in bilateral deltoid, bicep, tricep, grip, hip flexor, knee extensors, ankle dorsiflexor and plantar flexor Sensory exam cannot assess due to aphasia Good receptive but imparied exp language Musculoskeletal: Full range of motion in all 4 extremities. No joint swelling   Assessment/Plan: 1. Functional deficits secondary to Left MCA infarct with aphasia and gait d/o which require 3+ hours per day of interdisciplinary therapy in a comprehensive inpatient rehab setting. Physiatrist is providing close team supervision and 24 hour management of active medical problems listed below. Physiatrist and rehab team continue to assess barriers to discharge/monitor patient progress toward functional and medical goals. FIM: Function - Bathing Position: Wheelchair/chair at sink Body parts bathed by patient: Right arm, Left arm, Chest, Abdomen, Front perineal area, Buttocks, Right upper leg, Left upper leg Body parts bathed by helper: Right lower leg, Left lower leg, Back Assist Level: Touching or steadying assistance(Pt > 75%)  Function- Upper Body Dressing/Undressing What is the patient wearing?: Pull over shirt/dress Pull over shirt/dress - Perfomed by patient: Thread/unthread right sleeve, Thread/unthread left sleeve, Put head through opening, Pull shirt over trunk Assist Level: Set up Set up : To obtain clothing/put away Function - Lower Body Dressing/Undressing What is the patient wearing?: Pants, Non-skid slipper socks Position: Wheelchair/chair at sink Pants- Performed by patient: Pull pants up/down, Fasten/unfasten pants Pants- Performed by helper: Thread/unthread right pants leg, Thread/unthread left pants leg Non-skid slipper socks- Performed by helper: Don/doff right sock, Don/doff left sock Assist for footwear: Dependant Assist for lower body dressing: Touching or steadying assistance (Pt >  75%)  Function - Toileting Toileting activity did not occur: No continent bowel/bladder event Toileting steps completed  by helper: Adjust clothing prior to toileting, Performs perineal hygiene, Adjust clothing after toileting  Function - Toilet Transfers Toilet transfer activity did not occur: Safety/medical concerns Toilet transfer assistive device: Grab bar Assist level to toilet: Touching or steadying assistance (Pt > 75%) Assist level from toilet: Touching or steadying assistance (Pt > 75%)  Function - Chair/bed transfer Chair/bed transfer method: Stand pivot Chair/bed transfer assist level: Touching or steadying assistance (Pt > 75%) Chair/bed transfer assistive device: Armrests, Walker Chair/bed transfer details: Verbal cues for sequencing, Verbal cues for technique, Verbal cues for precautions/safety, Verbal cues for safe use of DME/AE  Function - Locomotion: Wheelchair Will patient use wheelchair at discharge?: Yes Type: Manual Max wheelchair distance: 2ft Assist Level: Touching or steadying assistance (Pt > 75%) Assist Level: Touching or steadying assistance (Pt > 75%) Wheel 150 feet activity did not occur: Safety/medical concerns Turns around,maneuvers to table,bed, and toilet,negotiates 3% grade,maneuvers on rugs and over doorsills: No Function - Locomotion: Ambulation Assistive device: Walker-rolling Max distance: 180 Assist level: Touching or steadying assistance (Pt > 75%) Assist level: Touching or steadying assistance (Pt > 75%) Assist level: Touching or steadying assistance (Pt > 75%) Walk 150 feet activity did not occur: Safety/medical concerns Assist level: Touching or steadying assistance (Pt > 75%) Assist level: Touching or steadying assistance (Pt > 75%)  Function - Comprehension Comprehension: Auditory Comprehension assist level: Understands basic 75 - 89% of the time/ requires cueing 10 - 24% of the time  Function - Expression Expression:  Verbal Expression assist level: Expresses basic 75 - 89% of the time/requires cueing 10 - 24% of the time. Needs helper to occlude trach/needs to repeat words.  Function - Social Interaction Social Interaction assist level: Interacts appropriately 75 - 89% of the time - Needs redirection for appropriate language or to initiate interaction.  Function - Problem Solving Problem solving assist level: Solves basic 50 - 74% of the time/requires cueing 25 - 49% of the time  Function - Memory Memory assist level: Recognizes or recalls 25 - 49% of the time/requires cueing 50 - 75% of the time Patient normally able to recall (first 3 days only): None of the above  Medical Problem List and Plan: 1.  Right hemiparesis/aphasia  secondary to left MCA infarct secondary to left ICA /MCA occlusion status post revascularization with mechanical thrombectomy               -cont CIR  2.  DVT Prophylaxis/Anticoagulation: SCDs   3. Pain Management/history of multiple back surgeries: Tylenol as needed               -pain appears controlled at present 4. Dysphagia. Dysphagia #2 thin liquids. Follow-up speech therapy and advance as appropriate, no signs of aspiration 5. Neuropsych: This patient is  capable of making decisions on his  own behalf. 6. Skin/Wound Care: Routine skin checks   7. Fluids/Electrolytes/Nutrition: Routine I&O with follow-up chemistries upon admit, 100% meal intake, po fluids yest 8. Atrial fibrillation/hypertropic cardiomyopathy/pacemaker. Follow-up per cardiology services. Heart rate controlled at present. Continue amiodarone 400 mg twice daily for 5 days then decrease to 200 mg twice daily and then after 2 weeks decrease to 200 mg daily 9. Hypertension. Lisinopril 20 mg daily, Toprol 50 mg daily, needs pills crushed, swich toprol xl to metoprolol. Monitor with increased mobility 10. Hyperlipidemia. Crestor 11. Pulmonary edema. Improved after IV lasix, now on po lasix, BPs getting soft  reduce to  12.  Aphasia- SLP eval 13.  Increased stooling  but stools are formed , lab would not test specimen, abd exam is benign LOS (Days) 3 A FACE TO FACE EVALUATION WAS PERFORMED  Lety Cullens E 05/27/2015, 7:01 AM

## 2015-05-27 NOTE — IPOC Note (Addendum)
Overall Plan of Care [facility](IPOC) Patient Details Name: Luis SoloKenneth Robinson MRN: [id number] DOB: [birth year]  Admitting Diagnosis: CVA  Hospital Problems: Principal Problem:   Left middle cerebral artery stroke [facility](HCC) Active Problems:   Paroxysmal atrial fibrillation (HCC)   Acute blood loss anemia   Aphasia due to stroke   Right hemiparesis (HCC)     Functional Problem List: Nursing Behavior, Bladder, Bowel, Endurance, Medication Management, Motor, Nutrition, Safety, Skin Integrity  PT Balance, Behavior, Motor, Endurance, Perception, Safety, Sensory, Skin Integrity  OT Balance, Cognition, Endurance, Motor, Perception, Safety, Sensory, Vision  SLP Cognition, Linguistic, Nutrition  TR         Basic ADL's: OT Grooming, Bathing, Dressing, Toileting     Advanced  ADL's: OT       Transfers: PT Bed Mobility, Bed to Chair, Car, State Street CorporationFurniture, Civil Service fast [place]  OT Toilet, Research scientist (life sciences)Tub/Shower     Locomotion: PT Ambulation, Psychologist, prison and probation servicesWheelchair Mobility, Stairs     Additional Impairments: OT Other (comment) (cognitive deficits)  SLP Swallowing, Communication, Social Cognition comprehension, expression Social Interaction, Problem Solving, Memory, Attention, Awareness  TR      Anticipated Outcomes Item Anticipated Outcome  Self Feeding n/a  Swallowing  mod I    Basic self-care  supervision  Toileting  supervision   Bathroom Transfers supervision  Bowel/Bladder  continent of bowel and bladder  Transfers  Mod I   Locomotion  Supervision with LRAD.   Communication  min A for basic  Cognition  mod A  Pain  2 or less using face scale  Safety/Judgment  no falls while on rehab   Therapy Plan: PT Intensity: Minimum of 1-2 x/day ,45 to 90 minutes PT Frequency: 5 out of 7 days PT Duration Estimated Length of Stay: 10-14 days.  OT Intensity: Minimum of 1-2 x/day, 45 to 90 minutes OT Frequency: 5 out of 7 days OT Duration/Estimated Length of Stay: 7-10 days SLP Intensity: Minumum of 1-2 x/day, 30 to 90  minutes SLP Frequency: 3 to 5 out of 7 days SLP Duration/Estimated Length of Stay: 7-10 days       Team Interventions: Nursing Interventions Patient/Family Education, Bladder Management, Bowel Management, Medication Management, Disease Management/Prevention, Skin Care/Wound Management, Dysphagia/Aspiration Precaution Training, Discharge Planning, Psychosocial Support, Cognitive Remediation/Compensation  PT interventions Ambulation/gait training, Warden/rangerBalance/vestibular training, Cognitive remediation/compensation, Community reintegration, Discharge planning, Disease management/prevention, DME/adaptive equipment instruction, Functional electrical stimulation, Functional mobility training, Neuromuscular re-education, Pain management, Patient/family education, Psychosocial support, Skin care/wound management, Splinting/orthotics, Stair training, Therapeutic Activities, Therapeutic Exercise, UE/LE Strength taining/ROM, UE/LE Coordination activities, Visual/perceptual remediation/compensation, Wheelchair propulsion/positioning  OT Interventions Warden/rangerBalance/vestibular training, Cognitive remediation/compensation, FirefighterCommunity reintegration, Discharge planning, Neuromuscular re-education, Functional mobility training, Patient/family education, DME/adaptive equipment instruction, Psychosocial support, Self Care/advanced ADL retraining, Splinting/orthotics, Therapeutic Activities, Therapeutic Exercise, UE/LE Strength taining/ROM, UE/LE Coordination activities, Visual/perceptual remediation/compensation, Wheelchair propulsion/positioning  SLP Interventions Cognitive remediation/compensation, Multimodal communication approach, Speech/Language facilitation, Cueing hierarchy, Functional tasks, Therapeutic Activities, Patient/family education, Dysphagia/aspiration precaution training  TR Interventions    SW/CM Interventions  Psychosocial Assessment, Pt/Family Architectural technologistducation,Discharge Planning    Team Discharge Planning: Destination:  PT-Home ,OT- Home , SLP-Home Projected Follow-up: PT-Home health PT, OT-  Other (comment) (TBD), SLP-Outpatient SLP, 24 hour supervision/assistance Projected Equipment Needs: PT-Rolling walker with 5" wheels, Wheelchair (measurements), Wheelchair cushion (measurements), OT- To be determined, SLP-None recommended by SLP Equipment Details: PT- , OT-  Patient/family involved in discharge planning: PT- Patient,  OT-Patient, SLP-Patient  MD ELOS: 7-10d Medical Rehab Prognosis:  Good Assessment: 75 y.o. male with history of Hypertrophic cardiomyopathy/PAF with ablation/pacemaker maintained on Eliquis and  followed by Dr. Dulce Sellar at [facility], HTN, tobacco abuse, back surgery 2 with antalgic gait who was admitted on 05/20/15 after fall with acute onset of right sided weakness with inability to speak. 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 Mild mass effect upon the left lateral ventricle. He was found ot have A fib with RVR and IV tPA not given as patient had taken his dose of eliquis already. He underwent cerebral angio with complete revascularization of occluded L-MCA with single pass Solitaire and IA tPA and complete revascularization of L-ICA occlusion with IV tPA /stent assisted angioplasty. He tolerated extubation post procedure and has had improvement in right sided weakness. Patient with resultant right inattention, right foot drop, kyphosis, severe paraphrasia's and perseveration affecting cognition/comprehension. Neurology consulted with follow-up and currently maintained on aspirin and Plavix for CVA prophylaxis..   Now requiring 24/7 Rehab RN,MD, as well as CIR level PT, OT and SLP.  Treatment team will focus on ADLs and mobility with goals set at Sup  See Team Conference Notes for weekly updates to the plan of care

## 2015-05-27 NOTE — Progress Notes (Signed)
One stool during night, not C-diff consistency. 4 stools in past 24 hours. Will DC enteric precautions. Wearing O2 at 2L/m via [place]. Dry dressings to bilateral groin. No attempts OOB during the night. Bed alarm in place to alert staff when getting up without assistance and remind patient to call. Alfredo MartinezMurray, Shanique Aslinger A

## 2015-05-27 NOTE — Progress Notes (Signed)
Social Work  Social Work Assessment and Plan  Patient Details  Name: Luis Robinson MRN: [id number] Date of Birth: 11-22-1940  Today's Date: 05/27/2015  Problem List:  Patient Active Problem List   Diagnosis Date Noted  . Aphasia due to stroke 05/25/2015  . Right hemiparesis (HCC) 05/25/2015  . 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 SSS (sick sinus syndrome) (HCC) 05/22/2015  . Cardiac pacemaker in situ-MDT- implanted 2008 05/22/2015  . Chronic anticoagulation-Eliquis prior to adm 05/22/2015  . Atrial fibrillation with RVR (HCC) 05/22/2015  . CAD- S/P Dx1 DES Aug 2016 05/22/2015  . Dyslipidemia 05/22/2015  . Prediabetes   . Tobacco abuse   . Paroxysmal atrial fibrillation (HCC)   . Benign essential HTN   . History of back surgery   . Acute blood loss anemia   . Hypertrophic obstructive cardiomyopathy (HCC)   . Tachypnea   . Leukocytosis   . Thrombocytopenia (HCC)   . CVA due to occlusion of cerebral artery- treated with LMCA stent 05/20/15   . History of ETT   . Respiratory failure with hypoxia (HCC)   . Stroke (HCC)   . Stroke (cerebrum) (HCC) 05/20/2015   Past Medical History:  Past Medical History  Diagnosis Date  . Hypertrophic cardiomyopathy (HCC)   . Hyperlipidemia   . Hypertension   . Tobacco use disorder   . Dyspnea   . Dizziness   . PAF (paroxysmal atrial fibrillation) (HCC)   . SSS (sick sinus syndrome) (HCC)   . CAD S/P percutaneous coronary angioplasty Aug 2016    Dx1 DES, no other significant dis  . Chronic anticoagulation     Eliquis   Past Surgical History:  Past Surgical History  Procedure Laterality Date  . Back surgery x 2    . Rotator cuff surgery    . Insert / replace / remove pacemaker  2008    MDT  . Radiology with anesthesia N/A 05/20/2015    Procedure: RADIOLOGY WITH ANESTHESIA;  Surgeon: Medication Radiologist, MD;  Location: [facility] OR;  Service: Radiology;  Laterality: N/A;  . [facility]   Social  History:  reports that he has been smoking Cigarettes.  He has been smoking about 0.50 packs per day. He has never used smokeless tobacco. He reports that he does not drink alcohol or use illicit drugs.  Family / Support Systems Marital Status: Widow/Widower Patient Roles: Parent, Volunteer Children: Melissa Montgomery-daughter [phone] Other Supports: Genesis-son [phone] Anticipated Caregiver: Scottie Ability/Limitations of Caregiver: Son is disabled due to back issues, so can not provide any physical care Caregiver Availability: Other (Comment) Family Dynamics: Close knit small family, just he [place] two children [place], while he has siblings in [place]. They are involved in each other lives and assist one another. Daughter is finishing nursing school later this month. Son is disabled due to his back issues, but is able to do home management.  Social History Preferred language: English Religion:  Cultural Background: No issues Education: High School Read: Yes Write: Yes Employment Status: Retired Fish farm managerLegal Hisotry/Current Legal Issues: No issues Guardian/Conservator: None-according to MD pt is capable of making his own decisions while here, but will make sure a child is here due to pt has trouble communicating due to his stroke.   Abuse/Neglect Physical Abuse: Denies Verbal Abuse: Denies Sexual Abuse: Denies Exploitation of patient/patient's resources: Denies Self-Neglect: Denies  Emotional Status Pt's affect, behavior adn adjustment status: Pt has always been very independent  and not able to sit still, he wants to regain his independence while here. Daughter has been here and confirms pt is very active and not one to sit around the house. This will be the hard part keeping him from moving around on his own and being safe. Recent Psychosocial Issues: other health issues-cardiac issues but were managed by MD following Pyschiatric History: No history-deferred  depression screen due to taking each day at a time and trying to cope with being told what to do. He seems to be coping appropriately but has speech deficits from his stroke and has difficulty answering questions at times. Will monitor and ask team if feels would be appropriate to have neuro-psych see while here. Substance Abuse History: Tobacco-continues to smoke even though aware of the health risks and has tried to quite but has not succeeded. Will see  if will quit this time.  Patient / Family Perceptions, Expectations & Goals Pt/Family understanding of illness & functional limitations: Pt and daughter can explain his stroke and deficits. Daughter is in nursing school and has a good understanding of his deficits. She has spoken with the MD and feels her questions have been addressed and both are hopeful he will do well here. Premorbid pt/family roles/activities: Father, grandfather, retiree, veteran, friend, church member, etc Anticipated changes in roles/activities/participation: resume Pt/family expectations/goals: Pt states: " I hope my balance gets better."  Daughter states: " I hope he can get to a level where my brother can manage him at home. He can only do supervision due to his back issues."  [employer]: Other (Comment) ([facility] system) Premorbid Home Care/DME Agencies: None Transportation available at discharge: family Resource referrals recommended: Support group (specify)  Discharge Planning Living Arrangements: Children Support Systems: Children, Other relatives, Friends/neighbors, Psychologist, clinical community Type of Residence: Private residence Insurance Resources: Media planner (specify) Administrator Medicare) Financial Resources: Social Security, Family Support Financial Screen Referred: No Living Expenses: Own Money Management: Patient, Family Does the patient have any problems obtaining your medications?: No Home Management: Son does most of the cooking  and home management Patient/Family Preliminary Plans: Return home with son who can provide supervision level, due to back issues. Daugther is at the end of her nursing school and will assist in the evenings if needed. Made aware team goals are supervision and both hopeful will reach these goals while here. Will meet with Wed after team conf to discuss goals and target discharge date. Social Work Anticipated Follow Up Needs: HH/OP, Support Group  Clinical Impression Pleasant strong willed gentleman who is used to being independent and active, so it is hard for him to sit still. His two children are supportive and involved and will assist at discharge. Made them aware he will need atleast supervision level, which son can provide, not any more than that. Will meet with once team conference and will work on a safe discharge plan.  Lucy Chris 05/27/2015, 12:49 PM

## 2015-05-27 NOTE — Progress Notes (Signed)
Occupational Therapy Session Note  Patient Details  Name: Luis Robinson MRN: [id number] Date of Birth: 11-25-1940  Today's Date: 05/27/2015 OT Individual Time: 0902-1000 OT Individual Time Calculation (min): 58 min    Short Term Goals: Week 1:  OT Short Term Goal 1 (Week 1): STGs=LTGs due to short ELOS  Skilled Therapeutic Interventions/Progress Updates:    Pt overall min guard assist for mobility to the shower for bathing.  Pt with bowel incontinence in his brief and he was not aware of it.  He completed bathing with min assist and mod instructional cueing for thoroughness.  Pt with decreased ability to sequence through using the hand held shower as well with therapist having to assist him with rinsing off.  Dressing EOB with mod instructional cueing to start with the right side first when donning pants as he started with the left when donning his brief and then needed assistance for the RLE.  Increased difficulty donning watch on the left hand so therapist needed to assist him with this.  Min guard assist for sit to stand and standing to pull pants over hips.  Pt left in bed with bed alarm in place at end of session.    Therapy Documentation Precautions:  Precautions Precautions: Fall Precaution Comments: R foot drop  Restrictions Weight Bearing Restrictions: No  Vital Signs: Therapy Vitals Pulse Rate: 74 Oxygen Therapy SpO2: 97 % O2 Device: Not Delivered Pulse Oximetry Type: Intermittent Pain: Pain Assessment Pain Assessment: No/denies pain ADL: See Function Navigator for Current Functional Status.   Therapy/Group: Individual Therapy  Georgann Bramble OTR/L 05/27/2015, 12:31 PM

## 2015-05-28 ENCOUNTER — Inpatient Hospital Stay (HOSPITAL_COMMUNITY): Payer: Medicare HMO | Admitting: Physical Therapy

## 2015-05-28 ENCOUNTER — Inpatient Hospital Stay (HOSPITAL_COMMUNITY): Payer: Medicare HMO | Admitting: Speech Pathology

## 2015-05-28 ENCOUNTER — Inpatient Hospital Stay (HOSPITAL_COMMUNITY): Payer: Medicare HMO

## 2015-05-28 ENCOUNTER — Inpatient Hospital Stay (HOSPITAL_COMMUNITY): Payer: Medicare HMO | Admitting: Occupational Therapy

## 2015-05-28 MED ORDER — AMIODARONE HCL 200 MG PO TABS
200.0000 mg | ORAL_TABLET | Freq: Two times a day (BID) | ORAL | Status: DC
  Administered 2015-05-28 – 2015-06-05 (×17): 200 mg via ORAL
  Filled 2015-05-28 (×17): qty 1

## 2015-05-28 NOTE — Progress Notes (Signed)
Speech Language Pathology Daily Session Note  Patient Details  Name: Luis Robinson MRN: [id number] Date of Birth: February 23, 1941  Today's Date: 05/28/2015 SLP Individual Time: 0900-1000 SLP Individual Time Calculation (min): 60 min  Short Term Goals: Week 1: SLP Short Term Goal 1 (Week 1): Pt demonstrate ability to answer simple Y/N questions  accurately with mod A. SLP Short Term Goal 2 (Week 1): Pt demonstrate reading comprehension at the single word level with min A. SLP Short Term Goal 3 (Week 1): Pt demonstrate O x 4 with mod A. SLP Short Term Goal 4 (Week 1): Pt demonstrate ability to follow 2-step directives with mod A.  SLP Short Term Goal 5 (Week 1): Pt demonstrate object naming to identifiable approximation with mod A. SLP Short Term Goal 6 (Week 1): Pt tolerate trials of regular texture without oral residue with min A.  Skilled Therapeutic Interventions: Skilled treatment session focused on functional communication. Patient completed confrontational naming with pictures of functional items with 100% accuracy and Min A verbal and question cues. However, patient required Max-Total A multimodal cues to verbally compare/contrast items and to complete a verbal description task the phrase level. Patient also required Max-Total A to self-monitor and correct verbal errors throughout tasks. Patient left sitting EOB with alarm on and all needs within reach. Continue with current plan of care.    Function:  Cognition Comprehension Comprehension assist level: Understands basic 75 - 89% of the time/ requires cueing 10 - 24% of the time  Expression   Expression assist level: Expresses basic 50 - 74% of the time/requires cueing 25 - 49% of the time. Needs to repeat parts of sentences.  Social Interaction Social Interaction assist level: Interacts appropriately 75 - 89% of the time - Needs redirection for appropriate language or to initiate interaction.  Problem Solving Problem solving assist  level: Solves basic 50 - 74% of the time/requires cueing 25 - 49% of the time  Memory Memory assist level: Recognizes or recalls 25 - 49% of the time/requires cueing 50 - 75% of the time    Pain Pain Assessment Pain Assessment: No/denies pain Pain Score: 0-No pain  Therapy/Group: Individual Therapy  Luis Robinson 05/28/2015, 4:02 PM

## 2015-05-28 NOTE — Progress Notes (Signed)
Physical Therapy Note  Patient Details  Name: Luis SoloKenneth Oshima MRN: [id number] Date of Birth: [birth year] Today's Date: 05/28/2015    Time: 1300-1340 40 minutes  1:1 No c/o pain. Gait in controlled environment with min A, Rt AFO with min A to correct occasional LOB.   Standing balance tasks with step ups with min A for step up, mod A for eccentric control on R LE for descent.  Standing ball toss and kick with min A for toss, mod A for balance with ball kicking.  Gait with ball toss 2 x 100' with mod A for balance, improving balance reactions noted.  Pt requires frequent rest breaks due to fatigue but is improving gait and balance reactions.   [name] 05/28/2015, 1:42 PM

## 2015-05-28 NOTE — Progress Notes (Signed)
.Plan to repeat CT of the head in 1 week if hemorrhage resolving resume Eliquis to Plavix and stop aspirin at that time  Subjective/Complaints: Pt got up last nite about 2am and put his shoes on and started walking, refused to let staff take his shoes off  ROS limited aphasia, denies abd pain , N/V, -SOB Objective: Vital Signs: Blood pressure 123/64, pulse 60, temperature 97.9 F (36.6 C), temperature source Oral, resp. rate 18, weight 85.6 kg (188 lb 11.4 oz), SpO2 99 %. No results found. Results for orders placed or performed during the hospital encounter of 05/24/15 (from the past 72 hour(s))  CBC WITH DIFFERENTIAL     Status: None   Collection Time: 05/27/15  7:42 AM  Result Value Ref Range   WBC 8.9 4.0 - 10.5 K/uL   RBC 4.22 4.22 - 5.81 MIL/uL   Hemoglobin 13.1 13.0 - 17.0 g/dL   HCT 39.5 39.0 - 52.0 %   MCV 93.6 78.0 - 100.0 fL   MCH 31.0 26.0 - 34.0 pg   MCHC 33.2 30.0 - 36.0 g/dL   RDW 13.3 11.5 - 15.5 %   Platelets 155 150 - 400 K/uL   Neutrophils Relative % 75 %   Neutro Abs 6.6 1.7 - 7.7 K/uL   Lymphocytes Relative 12 %   Lymphs Abs 1.1 0.7 - 4.0 K/uL   Monocytes Relative 11 %   Monocytes Absolute 1.0 0.1 - 1.0 K/uL   Eosinophils Relative 2 %   Eosinophils Absolute 0.2 0.0 - 0.7 K/uL   Basophils Relative 0 %   Basophils Absolute 0.0 0.0 - 0.1 K/uL  Comprehensive metabolic panel     Status: Abnormal   Collection Time: 05/27/15  7:42 AM  Result Value Ref Range   Sodium 141 135 - 145 mmol/L   Potassium 3.7 3.5 - 5.1 mmol/L   Chloride 104 101 - 111 mmol/L   CO2 29 22 - 32 mmol/L   Glucose, Bld 105 (H) 65 - 99 mg/dL   BUN 21 (H) 6 - 20 mg/dL   Creatinine, Ser 0.97 0.61 - 1.24 mg/dL   Calcium 8.8 (L) 8.9 - 10.3 mg/dL   Total Protein 5.7 (L) 6.5 - 8.1 g/dL   Albumin 2.6 (L) 3.5 - 5.0 g/dL   AST 32 15 - 41 U/L   ALT 31 17 - 63 U/L   Alkaline Phosphatase 51 38 - 126 U/L   Total Bilirubin 0.6 0.3 - 1.2 mg/dL   GFR calc non Af Amer >60 >60 mL/min   GFR calc Af  Amer >60 >60 mL/min    Comment: (NOTE) The eGFR has been calculated using the CKD EPI equation. This calculation has not been validated in all clinical situations. eGFR's persistently <60 mL/min signify possible Chronic Kidney Disease.    Anion gap 8 5 - 15      General: No acute distress Mood and affect are appropriate Heart: Regular rate and rhythm no rubs murmurs or extra sounds Lungs: Clear to auscultation, breathing unlabored, no rales or wheezes Abdomen: Positive bowel sounds, soft nontender to palpation, nondistended Extremities: No clubbing, cyanosis, or edema Skin: No evidence of breakdown, no evidence of rash Neurologic: Cranial nerves II through XII intact, motor strength is 5/5 in bilateral deltoid, bicep, tricep, grip, hip flexor, knee extensors, ankle dorsiflexor and plantar flexor Sensory exam cannot assess due to aphasia Good receptive but imparied exp language Musculoskeletal: Full range of motion in all 4 extremities. No joint swelling   Assessment/Plan: 1.  Functional deficits secondary to Left MCA infarct with aphasia and gait d/o which require 3+ hours per day of interdisciplinary therapy in a comprehensive inpatient rehab setting. Physiatrist is providing close team supervision and 24 hour management of active medical problems listed below. Physiatrist and rehab team continue to assess barriers to discharge/monitor patient progress toward functional and medical goals. FIM: Function - Bathing Position: Shower Body parts bathed by patient: Right arm, Left arm, Chest, Abdomen, Front perineal area, Right upper leg, Left upper leg Body parts bathed by helper: Back, Left lower leg, Right lower leg Assist Level: Touching or steadying assistance(Pt > 75%)  Function- Upper Body Dressing/Undressing What is the patient wearing?: Pull over shirt/dress Pull over shirt/dress - Perfomed by patient: Thread/unthread right sleeve, Thread/unthread left sleeve, Put head through  opening, Pull shirt over trunk Assist Level: Set up Set up : To obtain clothing/put away Function - Lower Body Dressing/Undressing What is the patient wearing?: Pants, Non-skid slipper socks, Underwear Position: Sitting EOB Underwear - Performed by patient: Thread/unthread left underwear leg, Pull underwear up/down Underwear - Performed by helper: Thread/unthread right underwear leg Pants- Performed by patient: Thread/unthread right pants leg, Thread/unthread left pants leg, Pull pants up/down Pants- Performed by helper: Thread/unthread right pants leg, Thread/unthread left pants leg Non-skid slipper socks- Performed by patient: Don/doff right sock, Don/doff left sock Non-skid slipper socks- Performed by helper: Don/doff right sock, Don/doff left sock Assist for footwear: Dependant Assist for lower body dressing: Touching or steadying assistance (Pt > 75%)  Function - Toileting Toileting activity did not occur: No continent bowel/bladder event Toileting steps completed by helper: Adjust clothing prior to toileting, Performs perineal hygiene, Adjust clothing after toileting Assist level: Touching or steadying assistance (Pt.75%) (per Elby Beck, NT report)  Function - Air cabin crew transfer activity did not occur: Safety/medical concerns Toilet transfer assistive device: Grab bar Assist level to toilet: Touching or steadying assistance (Pt > 75%) Assist level from toilet: Touching or steadying assistance (Pt > 75%)  Function - Chair/bed transfer Chair/bed transfer method: Stand pivot Chair/bed transfer assist level: Touching or steadying assistance (Pt > 75%) Chair/bed transfer assistive device: Armrests, Walker Chair/bed transfer details: Verbal cues for sequencing, Verbal cues for technique, Verbal cues for precautions/safety, Verbal cues for safe use of DME/AE  Function - Locomotion: Wheelchair Will patient use wheelchair at discharge?: Yes Type: Manual Max wheelchair  distance: 19f Assist Level: Touching or steadying assistance (Pt > 75%) Assist Level: Touching or steadying assistance (Pt > 75%) Wheel 150 feet activity did not occur: Safety/medical concerns Turns around,maneuvers to table,bed, and toilet,negotiates 3% grade,maneuvers on rugs and over doorsills: No Function - Locomotion: Ambulation Assistive device: Walker-rolling Max distance: 180 Assist level: Touching or steadying assistance (Pt > 75%) Assist level: Touching or steadying assistance (Pt > 75%) Assist level: Touching or steadying assistance (Pt > 75%) Walk 150 feet activity did not occur: Safety/medical concerns Assist level: Touching or steadying assistance (Pt > 75%) Assist level: Touching or steadying assistance (Pt > 75%)  Function - Comprehension Comprehension: Auditory Comprehension assist level: Understands basic 75 - 89% of the time/ requires cueing 10 - 24% of the time  Function - Expression Expression: Verbal Expression assist level: Expresses basic 50 - 74% of the time/requires cueing 25 - 49% of the time. Needs to repeat parts of sentences.  Function - Social Interaction Social Interaction assist level: Interacts appropriately 75 - 89% of the time - Needs redirection for appropriate language or to initiate interaction.  Function - Problem  Solving Problem solving assist level: Solves basic 50 - 74% of the time/requires cueing 25 - 49% of the time  Function - Memory Memory assist level: Recognizes or recalls 25 - 49% of the time/requires cueing 50 - 75% of the time Patient normally able to recall (first 3 days only): None of the above  Medical Problem List and Plan: 1.  Right hemiparesis/aphasia  secondary to left MCA infarct secondary to left ICA /MCA occlusion status post revascularization with mechanical thrombectomy               -cont CIR, team conf in am  2.  DVT Prophylaxis/Anticoagulation: SCDs   3. Pain Management/history of multiple back surgeries: Tylenol  as needed               -pain appears controlled at present 4. Dysphagia. Dysphagia #2 thin liquids. Follow-up speech therapy and advance as appropriate, no signs of aspiration 5. Neuropsych: This patient is  capable of making decisions on his  own behalf, pt became agitated last noc.sundowning 6. Skin/Wound Care: Routine skin checks   7. Fluids/Electrolytes/Nutrition: Routine I&O with follow-up chemistries upon admit, 100% meal intake, po fluids 767m yest 8. Atrial fibrillation/hypertropic cardiomyopathy/pacemaker. Follow-up per cardiology services. Heart rate controlled at present. Continue amiodarone  decrease to 200 mg twice daily today and then after 2 weeks decrease to 200 mg daily 9. Hypertension. Lisinopril 10 mg daily, Topro mg daily, metoprolol 12.533mBID . Monitor with increased mobility 10. Hyperlipidemia. Crestor 11. Pulmonary edema. Improved after IV lasix, now on po lasix, BPs getting soft reduce to 2012m2.  Aphasia- SLP eval 13.  Increased stooling but stools are formed , lab would not test specimen, abd exam is benign LOS (Days) 4 A FACE TO FACE EVALUATION WAS PERFORMED  Luis Robinson E 05/28/2015, 6:56 AM

## 2015-05-28 NOTE — Progress Notes (Signed)
Physical Therapy Session Note  Patient Details  Name: Luis Robinson MRN: [id number] Date of Birth: December 15, 1940  Today's Date: 05/28/2015 PT Individual Time: 1115-1200 PT Individual Time Calculation (min): 45 min   Short Term Goals: Week 1:  PT Short Term Goal 1 (Week 1): Patient will performed bed mobility at mod I level.  PT Short Term Goal 2 (Week 1): Patient will perform bed<>chair and sit<>stand at suprevision level.  PT Short Term Goal 3 (Week 1): Patient will ambulate 150 with min A using LRAD   Skilled Therapeutic Interventions/Progress Updates:    Pt received seated in bed, denies pain and agreeable to treatment. Stand pivot transfer w/c >bed with close S, note poor RLE foot clearance during transfer and pt requires cues for safety to complete turn prior to sitting. W/c propulsion with BUE for strengthening, coordination, problem solving; minA overall and mod verbal cues for technique with turning, hand positioning. Gait training with RLE PLS AFO; pt educated on role of AFO in assisting with foot clearance. Gait 2x220' with RW on initial trial, no AD on second trial with close S overall. Dynamic standing balance with alternating toe taps to numbered targets; able to correctly tap when given one number at a time, however unable to remember 3 number sequence, max verbal cues required. Returned to room with gait as above; remained seated on EOB with alarm intact, all needs within reach at completion of session.   Therapy Documentation Precautions:  Precautions Precautions: Fall Precaution Comments: R foot drop  Restrictions Weight Bearing Restrictions: No Pain: Pain Assessment Pain Assessment: No/denies pain Pain Score: 0-No pain   See Function Navigator for Current Functional Status.   Therapy/Group: Individual Therapy  Vista Lawmanlizabeth J Tygielski 05/28/2015, 12:19 PM

## 2015-05-28 NOTE — Plan of Care (Signed)
Problem: RH BLADDER ELIMINATION Goal: RH STG MANAGE BLADDER WITH ASSISTANCE STG Manage Bladder With min Assistance  Outcome: Not Progressing Condom cath, incontinent  Problem: RH SAFETY Goal: RH STG ADHERE TO SAFETY PRECAUTIONS W/ASSISTANCE/DEVICE STG Adhere to Safety Precautions With Assistance/Device.  Outcome: Not Progressing impulsive

## 2015-05-28 NOTE — Progress Notes (Signed)
Occupational Therapy Session Note  Patient Details  Name: Luis Robinson MRN: [id number] Date of Birth: 07-12-1940  Today's Date: 05/28/2015 OT Individual Time: 1005-1059 OT Individual Time Calculation (min): 54 min    Short Term Goals: Week 1:  OT Short Term Goal 1 (Week 1): STGs=LTGs due to short ELOS  Skilled Therapeutic Interventions/Progress Updates:    Pt completed grooming tasks in standing at the sink with min guard assist and mod instructional cueing to sequence.  Noted pt with dirty incontinence brief so therapist assisted with removal and cleaning.  Pt with continual bowel movement and urination in standing beyond the condom catheter.  Encourage pt to ambulate to the elevated toilet in his room and try to have BM as therapist was wiping multiple times.  He was able to successfully have a BM but needed max assist for thorough hygiene.  He did donn all new clothing with supervision and increased time as well as cueing to start pants and incontinence brief with the RLE first.  When finished he ambulated to the therapy gym with increased toe drag and use of the RW.  Worked on BUE coordination tasks in sitting on the mat.  He was able to complete 9 hole peg test in 42 secs with the RUE and 40 seconds with the left.  Progressed to tossing and catching a larger ball with increased accuracy.  Progressed to tossing and catching a tennis ball as well which was much more difficult with the RUE.  Returned to room at end of session.  Pt sat on side of bed with bed alarm in place and working on word search puzzles.  Pt's nurse notified of his positioning.    Therapy Documentation Precautions:  Precautions Precautions: Fall Precaution Comments: R foot drop  Restrictions Weight Bearing Restrictions: No  Pain: Pain Assessment Pain Assessment: No/denies pain Pain Score: 0-No pain ADL: See Function Navigator for Current Functional Status.   Therapy/Group: Individual Therapy  Luis Robinson  OTR/L 05/28/2015, 1:37 PM

## 2015-05-29 ENCOUNTER — Inpatient Hospital Stay (HOSPITAL_COMMUNITY): Payer: Medicare HMO | Admitting: Occupational Therapy

## 2015-05-29 ENCOUNTER — Inpatient Hospital Stay (HOSPITAL_COMMUNITY): Payer: Medicare HMO | Admitting: Speech Pathology

## 2015-05-29 ENCOUNTER — Inpatient Hospital Stay (HOSPITAL_COMMUNITY): Payer: Medicare HMO

## 2015-05-29 NOTE — Plan of Care (Signed)
Problem: RH BOWEL ELIMINATION Goal: RH STG MANAGE BOWEL WITH ASSISTANCE STG Manage Bowel with min Assistance.  Outcome: Not Progressing Pt incontinent of bowel  Problem: RH BLADDER ELIMINATION Goal: RH STG MANAGE BLADDER WITH ASSISTANCE STG Manage Bladder With min Assistance  Outcome: Not Progressing Incont. Of bladder

## 2015-05-29 NOTE — Progress Notes (Signed)
Social Work Patient ID: Luis SoloKenneth Robinson, male   DOB: 1941-01-19, 75 y.o.   MRN: [id number] Spoke with Melissa-daughter to discuss team conference goals-supervision and target discharge date 4/12. Discussed pt's incontinence issue and if her brother would be able to assist him with this. She is not sure but would talk with him. She plans once school is out to take a break before working to assist pt. She promised her Dad she would not put him in a NH, so she wants to try it at home first. She feels her brother will step up with her help. Asked if they could come in and attend therapies with pt, only day is Friday at 8:00-12:00 so will schedule him then so daughter and son can attend therapies with pt. She wants to definitely get home health to assist some with his care, but is aware of the limited time they are there in the home. Will work on a safe discharge plan. See how Friday goes.

## 2015-05-29 NOTE — Progress Notes (Signed)
Occupational Therapy Session Note  Patient Details  Name: Luis SoloKenneth Robinson MRN: [id number] Date of Birth: Dec 20, 1940  Today's Date: 05/29/2015 OT Individual Time: [id number] OT Individual Time Calculation (min): 41 min    Short Term Goals: Week 1:  OT Short Term Goal 1 (Week 1): STGs=LTGs due to short ELOS  Skilled Therapeutic Interventions/Progress Updates:    Pt supine in bed when therapist entered even though he had just finished SLP treatment.  Pt completed supine to sit with supervision.  Noted increased UE tremors bilaterally and when pt asked repeatedly if he was cold he stated no.  Pt unable to state why he was shaking.  BP checked at 145/66 and temperature 98.3.  Nursing made aware.  Pt completely soaked through brief and pants and was not aware of this.  Had him ambulate over to the sink with the RW and close supervision for cleaning peri area and donning new brief and clothing.  He was able to perform hygiene with min instructional cueing with therapist providing assist to make sure he was thorough.  He needed mod assist for donning new brief and clothing.  Once finished rolled him to the ADL apartment for simulated shower/tub transfers.  Min assist needed for stepping over the edge of the simulated walk-in shower and sitting on the seat.  He transferred back out and back to the wheelchair at the same level.  Had him return to the room via wheelchair and pt was placed in bed with blankets over him and bed alarm in place.  Still with noted UE tremors.  Call bell and phone in bed with him as well.    Therapy Documentation Precautions:  Precautions Precautions: Fall Precaution Comments: R foot drop  Restrictions Weight Bearing Restrictions: No  Pain: Pain Assessment Pain Assessment: No/denies pain ADL: See Function Navigator for Current Functional Status.   Therapy/Group: Individual Therapy  Meko Masterson OTR/L 05/29/2015, 12:22 PM

## 2015-05-29 NOTE — Progress Notes (Addendum)
Subjective/Complaints: No issues overnite, slept well , discussed with RN no agitation  ROS limited aphasia, denies abd pain , N/V, -SOB Objective: Vital Signs: Blood pressure 141/80, pulse 71, temperature 98.7 F (37.1 C), temperature source Oral, resp. rate 18, weight 81.8 kg (180 lb 5.4 oz), SpO2 97 %. Ct Head Wo Contrast  05/28/2015  CLINICAL DATA:  75 year old male status post revascularization and mechanical thrombectomy of occluded left MCA and ICA. EXAM: CT HEAD WITHOUT CONTRAST TECHNIQUE: Contiguous axial images were obtained from the base of the skull through the vertex without intravenous contrast. COMPARISON:  05/23/2015 FINDINGS: Left MCA infarct changes again identified with slightly decreased edema/mass effect. Left frontal hyperdensity within the infarct has slightly decreased. There is no evidence of midline shift. Mild chronic small-vessel white matter ischemic changes are again noted. No acute bony abnormalities are identified. IMPRESSION: Left MCA infarct changes again noted with slightly decreased edema/mass effect and slightly decreased hyperdensity/hemorrhage. Electronically Signed   By: Margarette Canada M.D.   On: 05/28/2015 13:01   Results for orders placed or performed during the hospital encounter of 05/24/15 (from the past 72 hour(s))  CBC WITH DIFFERENTIAL     Status: None   Collection Time: 05/27/15  7:42 AM  Result Value Ref Range   WBC 8.9 4.0 - 10.5 K/uL   RBC 4.22 4.22 - 5.81 MIL/uL   Hemoglobin 13.1 13.0 - 17.0 g/dL   HCT 39.5 39.0 - 52.0 %   MCV 93.6 78.0 - 100.0 fL   MCH 31.0 26.0 - 34.0 pg   MCHC 33.2 30.0 - 36.0 g/dL   RDW 13.3 11.5 - 15.5 %   Platelets 155 150 - 400 K/uL   Neutrophils Relative % 75 %   Neutro Abs 6.6 1.7 - 7.7 K/uL   Lymphocytes Relative 12 %   Lymphs Abs 1.1 0.7 - 4.0 K/uL   Monocytes Relative 11 %   Monocytes Absolute 1.0 0.1 - 1.0 K/uL   Eosinophils Relative 2 %   Eosinophils Absolute 0.2 0.0 - 0.7 K/uL   Basophils Relative 0 %    Basophils Absolute 0.0 0.0 - 0.1 K/uL  Comprehensive metabolic panel     Status: Abnormal   Collection Time: 05/27/15  7:42 AM  Result Value Ref Range   Sodium 141 135 - 145 mmol/L   Potassium 3.7 3.5 - 5.1 mmol/L   Chloride 104 101 - 111 mmol/L   CO2 29 22 - 32 mmol/L   Glucose, Bld 105 (H) 65 - 99 mg/dL   BUN 21 (H) 6 - 20 mg/dL   Creatinine, Ser 0.97 0.61 - 1.24 mg/dL   Calcium 8.8 (L) 8.9 - 10.3 mg/dL   Total Protein 5.7 (L) 6.5 - 8.1 g/dL   Albumin 2.6 (L) 3.5 - 5.0 g/dL   AST 32 15 - 41 U/L   ALT 31 17 - 63 U/L   Alkaline Phosphatase 51 38 - 126 U/L   Total Bilirubin 0.6 0.3 - 1.2 mg/dL   GFR calc non Af Amer >60 >60 mL/min   GFR calc Af Amer >60 >60 mL/min    Comment: (NOTE) The eGFR has been calculated using the CKD EPI equation. This calculation has not been validated in all clinical situations. eGFR's persistently <60 mL/min signify possible Chronic Kidney Disease.    Anion gap 8 5 - 15      General: No acute distress Mood and affect are appropriate Heart: Regular rate and rhythm no rubs murmurs or extra sounds Lungs:  Clear to auscultation, breathing unlabored, no rales or wheezes Abdomen: Positive bowel sounds, soft nontender to palpation, nondistended Extremities: No clubbing, cyanosis, or edema Skin: No evidence of breakdown, no evidence of rash Neurologic: Cranial nerves II through XII intact, motor strength is 5/5 in bilateral deltoid, bicep, tricep, grip, hip flexor, knee extensors, ankle dorsiflexor and plantar flexor Sensory exam cannot assess due to aphasia Good receptive but imparied exp language Musculoskeletal: Full range of motion in all 4 extremities. No joint swelling   Assessment/Plan: 1. Functional deficits secondary to Left MCA infarct with aphasia and gait d/o which require 3+ hours per day of interdisciplinary therapy in a comprehensive inpatient rehab setting. Physiatrist is providing close team supervision and 24 hour management of  active medical problems listed below. Physiatrist and rehab team continue to assess barriers to discharge/monitor patient progress toward functional and medical goals. FIM: Function - Bathing Position: Shower Body parts bathed by patient: Right arm, Left arm, Chest, Abdomen, Front perineal area, Right upper leg, Left upper leg Body parts bathed by helper: Back, Left lower leg, Right lower leg Assist Level: Touching or steadying assistance(Pt > 75%)  Function- Upper Body Dressing/Undressing What is the patient wearing?: Pull over shirt/dress Pull over shirt/dress - Perfomed by patient: Thread/unthread right sleeve, Thread/unthread left sleeve, Put head through opening, Pull shirt over trunk Assist Level: Set up Set up : To obtain clothing/put away Function - Lower Body Dressing/Undressing What is the patient wearing?: Socks, Shoes, Pants, Underwear Position: Sitting EOB Underwear - Performed by patient: Thread/unthread left underwear leg, Pull underwear up/down, Thread/unthread right underwear leg Underwear - Performed by helper: Thread/unthread right underwear leg Pants- Performed by patient: Thread/unthread right pants leg, Thread/unthread left pants leg, Pull pants up/down Pants- Performed by helper: Thread/unthread right pants leg, Thread/unthread left pants leg Non-skid slipper socks- Performed by patient: Don/doff right sock, Don/doff left sock Non-skid slipper socks- Performed by helper: Don/doff right sock, Don/doff left sock Shoes - Performed by patient: Don/doff right shoe, Don/doff left shoe, Fasten right, Fasten left Assist for footwear: Dependant Assist for lower body dressing: Touching or steadying assistance (Pt > 75%)  Function - Toileting Toileting activity did not occur: No continent bowel/bladder event Toileting steps completed by patient: Adjust clothing prior to toileting, Adjust clothing after toileting Toileting steps completed by helper: Performs perineal  hygiene Assist level: Touching or steadying assistance (Pt.75%) (per Elby Beck, NT report)  Function - Air cabin crew transfer activity did not occur: Safety/medical concerns Toilet transfer assistive device: Grab bar, Walker Assist level to toilet: Touching or steadying assistance (Pt > 75%) Assist level from toilet: Touching or steadying assistance (Pt > 75%)  Function - Chair/bed transfer Chair/bed transfer method: Stand pivot Chair/bed transfer assist level: Supervision or verbal cues Chair/bed transfer assistive device: Armrests Chair/bed transfer details: Verbal cues for precautions/safety, Verbal cues for technique  Function - Locomotion: Wheelchair Will patient use wheelchair at discharge?: Yes Type: Manual Max wheelchair distance: 13f Assist Level: Touching or steadying assistance (Pt > 75%) Assist Level: Touching or steadying assistance (Pt > 75%) Wheel 150 feet activity did not occur: Safety/medical concerns Turns around,maneuvers to table,bed, and toilet,negotiates 3% grade,maneuvers on rugs and over doorsills: No Function - Locomotion: Ambulation Assistive device: No device, Orthosis Max distance: 220 Assist level: Supervision or verbal cues Assist level: Supervision or verbal cues Assist level: Supervision or verbal cues Walk 150 feet activity did not occur: Safety/medical concerns Assist level: Supervision or verbal cues Assist level: Touching or steadying assistance (Pt >  75%)  Function - Comprehension Comprehension: Auditory Comprehension assist level: Understands basic 75 - 89% of the time/ requires cueing 10 - 24% of the time  Function - Expression Expression: Verbal Expression assist level: Expresses basic 50 - 74% of the time/requires cueing 25 - 49% of the time. Needs to repeat parts of sentences.  Function - Social Interaction Social Interaction assist level: Interacts appropriately 75 - 89% of the time - Needs redirection for appropriate  language or to initiate interaction.  Function - Problem Solving Problem solving assist level: Solves basic 50 - 74% of the time/requires cueing 25 - 49% of the time  Function - Memory Memory assist level: Recognizes or recalls 25 - 49% of the time/requires cueing 50 - 75% of the time Patient normally able to recall (first 3 days only): None of the above  Medical Problem List and Plan: 1.  Right hemiparesis/aphasia  secondary to left MCA infarct secondary to left ICA /MCA occlusion status post revascularization with mechanical thrombectomy               -Team conference today please see physician documentation under team conference tab, met with team face-to-face to discuss problems,progress, and goals. Formulized individual treatment plan based on medical history, underlying problem and comorbidities. 2.  DVT Prophylaxis/Anticoagulation: SCDs   3. Pain Management/history of multiple back surgeries: Tylenol as needed               -pain appears controlled at present 4. Dysphagia. Dysphagia #2 thin liquids. Follow-up speech therapy and advance as appropriate, no signs of aspiration 5. Neuropsych: This patient is  capable of making decisions on his  own behalf, pt calm last noc 6. Skin/Wound Care: Routine skin checks   7. Fluids/Electrolytes/Nutrition: Routine I&O with follow-up chemistries upon admit, 100% meal intake, po fluids 754m yest 8. Atrial fibrillation/hypertropic cardiomyopathy/pacemaker. Follow-up per cardiology services. Heart rate controlled at present. Continue amiodarone  decrease to 200 mg twice daily today and then after 2 weeks decrease to 200 mg daily 9. Hypertension. Lisinopril 10 mg daily, , metoprolol 12.585mBID . Monitor with increased mobility 10. Hyperlipidemia. Crestor 11. Pulmonary edema. Improved after IV lasix, now on po lasix, CT with minor changes will not restart Eliquis yet fluid balance -210014mmaintaining sats off O2 will d/c 12.  Aphasia- SLP eval 13.   Increased stooling but stools are formed , lab would not test specimen, abd exam is benign LOS (Days) 5 A FACE TO FACE EVALUATION WAS PERFORMED  Ellene Bloodsaw E 05/29/2015, 7:17 AM

## 2015-05-29 NOTE — Progress Notes (Signed)
Speech Language Pathology Daily Session Note  Patient Details  Name: Luis Robinson MRN: [id number] Date of Birth: Dec 18, 1940  Today's Date: 05/29/2015 SLP Individual Time: 0800-0900 SLP Individual Time Calculation (min): 60 min  Short Term Goals: Week 1: SLP Short Term Goal 1 (Week 1): Pt demonstrate ability to answer simple Y/N questions  accurately with mod A. SLP Short Term Goal 2 (Week 1): Pt demonstrate reading comprehension at the single word level with min A. SLP Short Term Goal 3 (Week 1): Pt demonstrate O x 4 with mod A. SLP Short Term Goal 4 (Week 1): Pt demonstrate ability to follow 2-step directives with mod A.  SLP Short Term Goal 5 (Week 1): Pt demonstrate object naming to identifiable approximation with mod A. SLP Short Term Goal 6 (Week 1): Pt tolerate trials of regular texture without oral residue with min A.  Skilled Therapeutic Interventions:  Pt was seen for skilled ST targeting goals for dysphagia and communication.   Pt initiated request to be changed due to having been incontinent of bowel prior to SLP's arrival.  Pt followed 2 step commands in a functional context when donning clean pants with extra time.  SLP facilitated the session with a phrase closure task to continue to address phrase level expression.  Pt was able to select an appropriate word to complete a phrase from a choice of three with mod faded to supervision level assist.  Pt was 90% accurate for awareness of verbal errors whenproducing phrases with overall min assist verbal cues.  Pt was 50% accurate for answering factual yes/no questions, which improved to 70% accuracy with supervision verbal cues, 80% with min assist verbal cues, and 100% accuracy with mod assist verbal cues.  SLP also facilitated the session with a trial of regular textures and thin liquids via straw to continue working towards liquids advancement.  Pt required consistent mod assist verbal cues for rate and portion control and to alternate  solids and liquids to clear residuals from the oral cavity due to impulsivity during intake.  Coughing noted following solids and large boluses of thins.   Do not recommend diet advancement at this time, although pt may now have meds whole versus crushed due to RN reports that pt is now longer chewing meds when administered whole.  Pt left in room in bed with bed alarm set.  Call bell left within reach.  Continue per current plan of care.     Function:  Eating Eating   Modified Consistency Diet: Yes Eating Assist Level: Supervision or verbal cues           Cognition Comprehension Comprehension assist level: Understands basic 50 - 74% of the time/ requires cueing 25 - 49% of the time  Expression   Expression assist level: Expresses basic 50 - 74% of the time/requires cueing 25 - 49% of the time. Needs to repeat parts of sentences.  Social Interaction Social Interaction assist level: Interacts appropriately 75 - 89% of the time - Needs redirection for appropriate language or to initiate interaction.  Problem Solving Problem solving assist level: Solves basic 50 - 74% of the time/requires cueing 25 - 49% of the time  Memory Memory assist level: Recognizes or recalls 25 - 49% of the time/requires cueing 50 - 75% of the time    Pain Pain Assessment Pain Assessment: No/denies pain  Therapy/Group: Individual Therapy  Luis Robinson, Luis Robinson 05/29/2015, 11:00 AM

## 2015-05-29 NOTE — Progress Notes (Signed)
Physical Therapy Session Note  Patient Details  Name: Luis SoloKenneth Wacker MRN: [id number] Date of Birth: 10-12-1940  Today's Date: 05/29/2015 PT Individual Time: 1100-1200 PT Individual Time Calculation (min): 60 min   Short Term Goals: Week 1:  PT Short Term Goal 1 (Week 1): Patient will performed bed mobility at mod I level.  PT Short Term Goal 2 (Week 1): Patient will perform bed<>chair and sit<>stand at suprevision level.  PT Short Term Goal 3 (Week 1): Patient will ambulate 150 with min A using LRAD   Skilled Therapeutic Interventions/Progress Updates:  Pt sound asleep but easily aroused. No pain reported. Incontinent of B and B, soaking wet, but denied it.  Bed mobility and sit> stand x 3 and standing with Rw for hygiene change.  Pt assisted with cleansing himself and wahsing hands at sink, with hand over hand assistance.    neuromuscular re-education via manual, visual cues for R knee flex/ext and hip abd/adduction in sitting, x 10 x 1 each.  Gait x 70' with 1 turn, with RW , min guard assist.  10MWT = 44 seconds, wearing RAFO, with cues for R step length and clearance.  Up/down 3" high steps 2 rails, min guard with need to sit at landing due to DOE.    Pt returned to nurse's station with quick release belt donned, supervised by RN.     Therapy Documentation Precautions:  Precautions Precautions: Fall Precaution Comments: R foot drop  Restrictions Weight Bearing Restrictions: No Pain: Pain Assessment Pain Assessment: No/denies pain    See Function Navigator for Current Functional Status.   Therapy/Group: Individual Therapy  Zaylin Runco 05/29/2015, 11:51 AM

## 2015-05-29 NOTE — Progress Notes (Signed)
Occupational Therapy Session Note  Patient Details  Name: Luis Robinson MRN: [id number] Date of Birth: March 08, 1940  Today's Date: 05/29/2015 OT Individual Time: 1500-1531 OT Individual Time Calculation (min): 31 min    Skilled Therapeutic Interventions/Progress Updates:    Pt transferred to EOB from supine with supervision.  Had him ambulate with the RW up the hallway toward the laundry room.  He was able to walk to the elevators from his room before sitting down to take a rest break.  Overall supervision for mobility with cueing to stand up tall and not push the walker too far in front of him.  He required use of a AFO in the right shoe to help with clearing his foot during swing phase.  Once down to the laundry room had pt sit in wheelchair and therapist rolled him back to his room where he transferred back to the EOB and removed his shoes and AFO.  Pt left in supine with bed alarm on and call button and phone in reach.    Therapy Documentation Precautions:  Precautions Precautions: Fall Precaution Comments: R foot drop  Restrictions Weight Bearing Restrictions: No  Pain: Pain Assessment Pain Assessment: No/denies pain ADL: See Function Navigator for Current Functional Status.   Therapy/Group: Individual Therapy  Kenlie Seki OTR/L 05/29/2015, 4:31 PM

## 2015-05-29 NOTE — Patient Care Conference (Signed)
Inpatient RehabilitationTeam Conference and Plan of Care Update Date: 05/29/2015   Time: 10:45 Am    Patient Name: Luis Robinson      Medical Record Number: [id number]  Date of Birth: Oct 03, 1940 Sex: Male         Room/Bed: 4M01C/4M01C-01 Payor Info: Payor: [employer] MEDICARE / Plan: [employer] MEDICARE HMO/PPO / Product Type: *No Product type* /    Admitting Diagnosis: CVA  Admit Date/Time:  05/24/2015  7:58 PM Admission Comments: No comment available   Primary Diagnosis:  Left middle cerebral artery stroke [facility](HCC) Principal Problem: Left middle cerebral artery stroke [facility](HCC)  Patient Active Problem List   Diagnosis Date Noted  . Aphasia due to stroke 05/25/2015  . Right hemiparesis (HCC) 05/25/2015  . 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 SSS (sick sinus syndrome) (HCC) 05/22/2015  . Cardiac pacemaker in situ-MDT- implanted 2008 05/22/2015  . Chronic anticoagulation-Eliquis prior to adm 05/22/2015  . Atrial fibrillation with RVR (HCC) 05/22/2015  . CAD- S/P Dx1 DES Aug 2016 05/22/2015  . Dyslipidemia 05/22/2015  . Prediabetes   . Tobacco abuse   . Paroxysmal atrial fibrillation (HCC)   . Benign essential HTN   . History of back surgery   . Acute blood loss anemia   . Hypertrophic obstructive cardiomyopathy (HCC)   . Tachypnea   . Leukocytosis   . Thrombocytopenia (HCC)   . CVA due to occlusion of cerebral artery- treated with LMCA stent 05/20/15   . History of ETT   . Respiratory failure with hypoxia (HCC)   . Stroke (HCC)   . Stroke (cerebrum) (HCC) 05/20/2015    Expected Discharge Date: Expected Discharge Date: 06/05/15  Team Members Present: Physician leading conference: Dr. Claudette LawsAndrew Kirsteins Social Worker Present: Dossie DerBecky Moustapha Tooker, LCSW Nurse Present: Chana Bodeeborah Sharp, RN PT Present: Wanda Plumparoline Cook, PT OT Present: Perrin MalteseJames McGuire, OT SLP Present: Jackalyn LombardNicole Page, SLP PPS Coordinator present : Tora DuckMarie Noel, RN, CRRN     Current Status/Progress Goal Weekly Team Focus   Medical   Patient with intermittent agitation at night. No longer on O2, breathing well.  Maintain medical stability and euvolemic status  Medication adjustments of diuretics as well as blood pressure medications as well as amiodarone   Bowel/Bladder   Incont x2; LBM 4/3  Cont x2 Mod assist with timed toileting  Cont with timed toileting   Swallow/Nutrition/ Hydration   Dys 3, thin liquids no straws   mod I   trials of thin liquids via straws   ADL's   min assist for LB selfcare and transfers.  Mod assist for toileting tasks.  Mod instructional cueing to sequence through basic selfcare tasks for thoroughness.   supervision overall  selfcare re-training, transfer training, balance re-training, neuromuscular re-education, pt/family education   Mobility   min A  supervision gait  balance, gait, need order for Rt AFO   Communication   moderate expressive>receptive aphasia, poor awareness of deficits   mod assist   phrase level expression, awareness of verbal errors, comprehension of simple yes/no questions and 1-2 step commands    Safety/Cognition/ Behavioral Observations  decreased awareness of deficits, poor safety awareness   supervision   safety awareness   Pain   No complaints of pain  < 3 on 0-10 pain scale  Continue monitoring pain    Skin   Intact; prior groin incision   No new infection/breakdown while on Rehab.   Continue monitoring patient skin.       *See Care Plan and progress notes  for long and short-term goals.  Barriers to Discharge: Poor safety awareness    Possible Resolutions to Barriers:  Continue rehabilitation, identify and train caregiver    Discharge Planning/Teaching Needs:  Home with his son who is disabled with back issues, so only can provide supervision level. Daughter to assist also but finishing nursing school this semester      Team Discussion:  Goals supervision level-main issues is cognition and speech deficits. Dys 3 thin liquid diet. Yes/no  getting more accurate. Sleeping better at night, doesn't use the call bell for toileting. Nursing working on incontinence issues. Trial of AFO. Need family education to see if realistic goal to go home with son. Poor awareness and carryover.   Revisions to Treatment Plan:  None   Continued Need for Acute Rehabilitation Level of Care: The patient requires daily medical management by a physician with specialized training in physical medicine and rehabilitation for the following conditions: Daily direction of a multidisciplinary physical rehabilitation program to ensure safe treatment while eliciting the highest outcome that is of practical value to the patient.: Yes Daily medical management of patient stability for increased activity during participation in an intensive rehabilitation regime.: Yes Daily analysis of laboratory values and/or radiology reports with any subsequent need for medication adjustment of medical intervention for : Blood pressure problems;Cardiac problems;Mood/behavior problems;Neurological problems  Nickole Adamek, Lemar Livings 05/29/2015, 1:20 PM

## 2015-05-30 ENCOUNTER — Inpatient Hospital Stay (HOSPITAL_COMMUNITY): Payer: Medicare HMO

## 2015-05-30 ENCOUNTER — Inpatient Hospital Stay (HOSPITAL_COMMUNITY): Payer: Medicare HMO | Admitting: Speech Pathology

## 2015-05-30 ENCOUNTER — Inpatient Hospital Stay (HOSPITAL_COMMUNITY): Payer: Medicare HMO | Admitting: Occupational Therapy

## 2015-05-30 NOTE — Progress Notes (Signed)
Occupational Therapy Weekly Progress Note  Patient Details  Name: Luis Robinson MRN: [id number] Date of Birth: 1941-01-01  Beginning of progress report period: May 24, 2015 End of progress report period: May 30, 2015  Today's Date: 05/30/2015 OT Individual Time: 1415-1500 OT Individual Time Calculation (min): 45 min    Luis Robinson is making steady progress with OT at this time and currently performs selfcare tasks and functional transfers at a min guard to supervision level.  He continues to need cueing for safety and for following hemi dressing techniques for the RLE.  He also needs cueing and assistance for thoroughness and sequencing of showers as he will not use soap or shampoo unless cued and would not wash all body parts.  Dressing tasks are more automatic where he needs less cueing, usually only to start with the right LE first.  Grooming tasks are completed at supervision level in standing as well.  Luis Robinson continues to demonstrate moderate bowel and bladder incontinence requiring total assist for clean up and to donn a new incontinence brief.  Expressive difficulties are also still present as well but he is able to understand and follow multi step commands consistently, he just exhibits decreased safety awareness overall and will attempt to get up without assistance.  RLE is still weak in overall with the need to use an AFO with mobility and transfers secondary to decreased dorsiflexion as well.   Feel he is able to reach supervision goals for discharge home but will need conformation from son that he can provide this and assist with incontinence cleanup.    Patient continues to demonstrate the following deficits: decreased balance, decreased awareness, decreased cognitive processing, decreased RLE strength, and therefore will continue to benefit from skilled OT intervention to enhance overall performance with BADL.  Patient progressing toward long term goals..  Continue plan of  care.  OT Short Term Goals Week 2:  OT Short Term Goal 1 (Week 2): Continue working on [employer] supervision level goals.    Skilled Therapeutic Interventions/Progress Updates:    Pt incontinent of bowel to start session so ambulated to the toilet with close supervision to finish bowel movement and clean up.  He was able to have more BM before therapist assist with cleaning up and donning new brief.  Washed hands at the sink before ambulating to the therapy gym with supervision.  Had him work on cognitive processing task of putting together a PVC pipe puzzle.  He needed mod questioning cueing to complete 3 puzzles before ambulating back to the room.  Pt left sitting on EOB at end of session with friends present for visit.  Bed alarm in place for safety.    Therapy Documentation Precautions:  Precautions Precautions: Fall Precaution Comments: R foot drop  Restrictions Weight Bearing Restrictions: No  Pain: Pain Assessment Pain Assessment: No/denies pain ADL: See Function Navigator for Current Functional Status.   Therapy/Group: Individual Therapy  Laylani Pudwill OTR/L 05/30/2015, 4:19 PM

## 2015-05-30 NOTE — Progress Notes (Signed)
Speech Language Pathology Daily Session Note  Patient Details  Name: Luis SoloKenneth Odeh MRN: [id number] Date of Birth: 1940/07/09  Today's Date: 05/30/2015 SLP Individual Time: 0800-0900 SLP Individual Time Calculation (min): 60 min  Short Term Goals: Week 1: SLP Short Term Goal 1 (Week 1): Pt demonstrate ability to answer simple Y/N questions  accurately with mod A. SLP Short Term Goal 2 (Week 1): Pt demonstrate reading comprehension at the single word level with min A. SLP Short Term Goal 3 (Week 1): Pt demonstrate O x 4 with mod A. SLP Short Term Goal 4 (Week 1): Pt demonstrate ability to follow 2-step directives with mod A.  SLP Short Term Goal 5 (Week 1): Pt demonstrate object naming to identifiable approximation with mod A. SLP Short Term Goal 6 (Week 1): Pt tolerate trials of regular texture without oral residue with min A.  Skilled Therapeutic Interventions:  Pt was seen for skilled ST targeting goals for communication and dysphagia.  SLP facilitated the session with a phrase closure task targeting phrase level expression and awareness of verbal errors.  Pt was 60% accurate for phrase completion from a field of three, which improved to 80% accuracy with min assist, 90% accuracy with mod assist, and 100% accurate with max assist verbal cues for awareness of verbal errors.  Pt was also able to complete phrases without choice of three for 50% accuracy, which improved to 60% accuracy with min assist and to 90% accuracy with max assist verbal cues.  Pt demonstrated significantly improved rate and portion control during trials of regular textures and thin liquids via straw with supervision cues only following initial instruction of swallowing precautions.  Recommend observing pt with a meal of regular textures prior to advancement.  Pt was returned to room and left in bed with call bell within reach.  Continue per current plan of care.    Function:  Eating Eating   Modified Consistency Diet:  Yes Eating Assist Level: Supervision or verbal cues           Cognition Comprehension Comprehension assist level: Understands basic 75 - 89% of the time/ requires cueing 10 - 24% of the time  Expression   Expression assist level: Expresses basic 50 - 74% of the time/requires cueing 25 - 49% of the time. Needs to repeat parts of sentences.  Social Interaction Social Interaction assist level: Interacts appropriately 75 - 89% of the time - Needs redirection for appropriate language or to initiate interaction.  Problem Solving Problem solving assist level: Solves basic 50 - 74% of the time/requires cueing 25 - 49% of the time  Memory Memory assist level: Recognizes or recalls 25 - 49% of the time/requires cueing 50 - 75% of the time    Pain Pain Assessment Pain Assessment: No/denies pain  Therapy/Group: Individual Therapy  Kellis Mcadam, Melanee SpryNicole L 05/30/2015, 4:20 PM

## 2015-05-30 NOTE — Progress Notes (Signed)
Occupational Therapy Session Note  Patient Details  Name: Luis Robinson MRN: [id number] Date of Birth: [birth year]  Today's Date: 05/30/2015 OT Individual Time: [id number] OT Individual Time Calculation (min): 56 min    Short Term Goals: Week 1:  OT Short Term Goal 1 (Week 1): STGs=LTGs due to short ELOS  Skilled Therapeutic Interventions/Progress Updates:    Pt worked on shower, grooming, and dressing tasks during session.  Mod instructional cueing to initiate and complete bathing tasks during session.  He was able to perform all of bathing with min guard assist for standing and sit to stand.  Therapist had to assist with rinsing him off in order to get soap completely off of him.  He ambulated out to the EOB for dressing tasks.  Mod instructional cueing to dress the right LE first as he continues to start with the left side.  He was able to complete most dressing with supervision but did need min assist for donning right shoe with AFO.  Progressed to standing at the sink for shaving with min assist for thoroughness.  Once he finished had him ambulate up the hallway and back without use of assistive device with min guard assist.  Pt placed at nurses station in wheelchair for supervision secondary to safety plan.    Therapy Documentation Precautions:  Precautions Precautions: Fall Precaution Comments: R foot drop  Restrictions Weight Bearing Restrictions: No  Pain: Pain Assessment Pain Assessment: No/denies pain ADL: See Function Navigator for Current Functional Status.   Therapy/Group: Individual Therapy  Azha Constantin OTR/L 05/30/2015, 1:57 PM

## 2015-05-30 NOTE — Progress Notes (Signed)
 Subjective/Complaints: No issues overnite discussed incont of stool, pt denies problems prior to CVA  ROS limited aphasia, denies abd pain , N/V, -SOB Objective: Vital Signs: Blood pressure 94/49, pulse 62, temperature 98.1 F (36.7 C), temperature source Oral, resp. rate 16, weight 82.1 kg (181 lb), SpO2 99 %. Ct Head Wo Contrast  05/28/2015  CLINICAL DATA:  75-year-old male status post revascularization and mechanical thrombectomy of occluded left MCA and ICA. EXAM: CT HEAD WITHOUT CONTRAST TECHNIQUE: Contiguous axial images were obtained from the base of the skull through the vertex without intravenous contrast. COMPARISON:  05/23/2015 FINDINGS: Left MCA infarct changes again identified with slightly decreased edema/mass effect. Left frontal hyperdensity within the infarct has slightly decreased. There is no evidence of midline shift. Mild chronic small-vessel white matter ischemic changes are again noted. No acute bony abnormalities are identified. IMPRESSION: Left MCA infarct changes again noted with slightly decreased edema/mass effect and slightly decreased hyperdensity/hemorrhage. Electronically Signed   By: Jeffrey  Hu M.D.   On: 05/28/2015 13:01   Results for orders placed or performed during the hospital encounter of 05/24/15 (from the past 72 hour(s))  CBC WITH DIFFERENTIAL     Status: None   Collection Time: 05/27/15  7:42 AM  Result Value Ref Range   WBC 8.9 4.0 - 10.5 K/uL   RBC 4.22 4.22 - 5.81 MIL/uL   Hemoglobin 13.1 13.0 - 17.0 g/dL   HCT 39.5 39.0 - 52.0 %   MCV 93.6 78.0 - 100.0 fL   MCH 31.0 26.0 - 34.0 pg   MCHC 33.2 30.0 - 36.0 g/dL   RDW 13.3 11.5 - 15.5 %   Platelets 155 150 - 400 K/uL   Neutrophils Relative % 75 %   Neutro Abs 6.6 1.7 - 7.7 K/uL   Lymphocytes Relative 12 %   Lymphs Abs 1.1 0.7 - 4.0 K/uL   Monocytes Relative 11 %   Monocytes Absolute 1.0 0.1 - 1.0 K/uL   Eosinophils Relative 2 %   Eosinophils Absolute 0.2 0.0 - 0.7 K/uL   Basophils  Relative 0 %   Basophils Absolute 0.0 0.0 - 0.1 K/uL  Comprehensive metabolic panel     Status: Abnormal   Collection Time: 05/27/15  7:42 AM  Result Value Ref Range   Sodium 141 135 - 145 mmol/L   Potassium 3.7 3.5 - 5.1 mmol/L   Chloride 104 101 - 111 mmol/L   CO2 29 22 - 32 mmol/L   Glucose, Bld 105 (H) 65 - 99 mg/dL   BUN 21 (H) 6 - 20 mg/dL   Creatinine, Ser 0.97 0.61 - 1.24 mg/dL   Calcium 8.8 (L) 8.9 - 10.3 mg/dL   Total Protein 5.7 (L) 6.5 - 8.1 g/dL   Albumin 2.6 (L) 3.5 - 5.0 g/dL   AST 32 15 - 41 U/L   ALT 31 17 - 63 U/L   Alkaline Phosphatase 51 38 - 126 U/L   Total Bilirubin 0.6 0.3 - 1.2 mg/dL   GFR calc non Af Amer >60 >60 mL/min   GFR calc Af Amer >60 >60 mL/min    Comment: (NOTE) The eGFR has been calculated using the CKD EPI equation. This calculation has not been validated in all clinical situations. eGFR's persistently <60 mL/min signify possible Chronic Kidney Disease.    Anion gap 8 5 - 15      General: No acute distress Mood and affect are appropriate Heart: Regular rate and rhythm no rubs murmurs or extra sounds Lungs:   Clear to auscultation, breathing unlabored, no rales or wheezes Abdomen: Positive bowel sounds, soft nontender to palpation, nondistended Extremities: No clubbing, cyanosis, or edema Skin: No evidence of breakdown, no evidence of rash Neurologic: Cranial nerves II through XII intact, motor strength is 5/5 in bilateral deltoid, bicep, tricep, grip, hip flexor, knee extensors,Left  ankle dorsiflexor and plantar flexor, 2- R ankle DF/PF Sensory exam cannot assess due to aphasia Good receptive but imparied exp language Musculoskeletal: Full range of motion in all 4 extremities. No joint swelling   Assessment/Plan: 1. Functional deficits secondary to Left MCA infarct with aphasia and gait d/o which require 3+ hours per day of interdisciplinary therapy in a comprehensive inpatient rehab setting. Physiatrist is providing close team  supervision and 24 hour management of active medical problems listed below. Physiatrist and rehab team continue to assess barriers to discharge/monitor patient progress toward functional and medical goals. FIM: Function - Bathing Position: Shower Body parts bathed by patient: Buttocks, Front perineal area Body parts bathed by helper: Back, Left lower leg, Right lower leg Bathing not applicable: Right arm, Left arm, Chest, Abdomen, Right upper leg, Left upper leg, Right lower leg, Left lower leg, Back Assist Level: Touching or steadying assistance(Pt > 75%)  Function- Upper Body Dressing/Undressing What is the patient wearing?: Pull over shirt/dress Pull over shirt/dress - Perfomed by patient: Thread/unthread right sleeve, Thread/unthread left sleeve, Put head through opening, Pull shirt over trunk Assist Level: Set up Set up : To obtain clothing/put away Function - Lower Body Dressing/Undressing What is the patient wearing?: Underwear, Pants Position: Wheelchair/chair at sink Underwear - Performed by patient: Thread/unthread left underwear leg, Pull underwear up/down Underwear - Performed by helper: Thread/unthread right underwear leg Pants- Performed by patient: Pull pants up/down Pants- Performed by helper: Thread/unthread right pants leg, Thread/unthread left pants leg, Pull pants up/down Non-skid slipper socks- Performed by patient: Don/doff right sock, Don/doff left sock Non-skid slipper socks- Performed by helper: Don/doff right sock, Don/doff left sock Shoes - Performed by patient: Don/doff right shoe, Don/doff left shoe, Fasten right, Fasten left Assist for footwear: Dependant Assist for lower body dressing: Touching or steadying assistance (Pt > 75%)  Function - Toileting Toileting activity did not occur: No continent bowel/bladder event Toileting steps completed by patient: Adjust clothing prior to toileting, Adjust clothing after toileting Toileting steps completed by  helper: Adjust clothing prior to toileting, Performs perineal hygiene, Adjust clothing after toileting Assist level: Touching or steadying assistance (Pt.75%) (per Elby Beck, NT report)  Function - Air cabin crew transfer activity did not occur: Safety/medical concerns Toilet transfer assistive device: Grab bar, Walker Assist level to toilet: Touching or steadying assistance (Pt > 75%) Assist level from toilet: Touching or steadying assistance (Pt > 75%)  Function - Chair/bed transfer Chair/bed transfer method: Stand pivot Chair/bed transfer assist level: Touching or steadying assistance (Pt > 75%) Chair/bed transfer assistive device: Walker Chair/bed transfer details: Verbal cues for precautions/safety, Verbal cues for technique  Function - Locomotion: Wheelchair Will patient use wheelchair at discharge?: Yes Type: Manual Max wheelchair distance: 25' Assist Level: Touching or steadying assistance (Pt > 75%) Assist Level: Touching or steadying assistance (Pt > 75%) Wheel 150 feet activity did not occur: Safety/medical concerns Turns around,maneuvers to table,bed, and toilet,negotiates 3% grade,maneuvers on rugs and over doorsills: No Function - Locomotion: Ambulation Assistive device: Walker-rolling Max distance: 70 Assist level: Touching or steadying assistance (Pt > 75%) Assist level: Supervision or verbal cues Assist level: Touching or steadying assistance (Pt > 75%) Walk  150 feet activity did not occur: Safety/medical concerns Assist level: Supervision or verbal cues Assist level: Touching or steadying assistance (Pt > 75%)  Function - Comprehension Comprehension: Auditory Comprehension assist level: Understands basic 75 - 89% of the time/ requires cueing 10 - 24% of the time  Function - Expression Expression: Verbal Expression assist level: Expresses basic 50 - 74% of the time/requires cueing 25 - 49% of the time. Needs to repeat parts of sentences.  Function -  Social Interaction Social Interaction assist level: Interacts appropriately 75 - 89% of the time - Needs redirection for appropriate language or to initiate interaction.  Function - Problem Solving Problem solving assist level: Solves basic 50 - 74% of the time/requires cueing 25 - 49% of the time  Function - Memory Memory assist level: Recognizes or recalls 25 - 49% of the time/requires cueing 50 - 75% of the time Patient normally able to recall (first 3 days only): None of the above  Medical Problem List and Plan: 1.  Right hemiparesis/aphasia  secondary to left MCA infarct secondary to left ICA /MCA occlusion status post revascularization with mechanical thrombectomy               -set D/C date of 4/12, Right foot drop from CVA, AFO ordered 2.  DVT Prophylaxis/Anticoagulation: SCDs   3. Pain Management/history of multiple back surgeries: Tylenol as needed               -pain appears controlled at present 4. Dysphagia. Dysphagia #2 thin liquids. Follow-up speech therapy and advance as appropriate, no signs of aspiration 5. Neuropsych: This patient is  capable of making decisions on his  own behalf, pt calm last noc 6. Skin/Wound Care: Routine skin checks   7. Fluids/Electrolytes/Nutrition: Routine I&O with follow-up chemistries upon admit, wt stable at 82Kg 8. Atrial fibrillation/hypertropic cardiomyopathy/pacemaker. Follow-up per cardiology services. Heart rate controlled at present. Continue amiodarone  decrease to 200 mg twice daily today and then after 2 weeks decrease to 200 mg daily 9. Hypertension. Lisinopril 10 mg daily, , metoprolol 12.5mg BID . Monitor with increased mobility 10. Hyperlipidemia. Crestor 11. Pulmonary edema. Improved after IV lasix, now on po lasix,maintaining sats off O2 will d/c 12.  Aphasia- SLP eval 13.  Increased stooling but stools are formed ,incont of stool LOS (Days) 6 A FACE TO FACE EVALUATION WAS PERFORMED  , E 05/30/2015, 6:56 AM     

## 2015-05-30 NOTE — Progress Notes (Signed)
Physical Therapy Session Note  Patient Details  Name: Luis SoloKenneth Costanzo MRN: [id number] Date of Birth: 1940-10-05  Today's Date: 05/30/2015 PT Individual Time: 1030 - 1100, 30 min individual    Short Term Goals: Week 1:  PT Short Term Goal 1 (Week 1): Patient will performed bed mobility at mod I level.  PT Short Term Goal 2 (Week 1): Patient will perform bed<>chair and sit<>stand at suprevision level.  PT Short Term Goal 3 (Week 1): Patient will ambulate 150 with min A using LRAD   Skilled Therapeutic Interventions/Progress Updates:  Pt donned socks EOB with supervision, and shoes including trial RAFO with min assist, in sitting with LEs crossed, without LOB, without cues.  Gait without AD x 100' with min assist except for mod assist at end of walk due to LOB L, requiring mod assist.  neuromuscular re-education for sitting:10 x 1long arc quad knee extension with 3# wt; standing exs: 10 x 1 each R.L hip abduction, R.L knee flexion, ,mini squats, heel/toe raises with RAFO removed. Pt has minimal R ankle DF during toe bil raises. [place] ex hand-out issued.  Pt left resting in w/c with quick release belt donned, at nurse's station for supervision.    Therapy Documentation Precautions:  Precautions Precautions: Fall Precaution Comments: R foot drop  Restrictions Weight Bearing Restrictions: No Pain: denied any pain        See Function Navigator for Current Functional Status.   Therapy/Group: Individual Therapy  Elly Haffey 05/30/2015, 7:53 AM

## 2015-05-30 NOTE — Progress Notes (Signed)
Orthopedic Tech Progress Note Patient Details:  Thereasa SoloKenneth Kimberling 05/15/1940 [id number]  Patient ID: Thereasa SoloKenneth Consolo, male   DOB: 05/15/1940, 75 y.o.   MRN: [id number] called in advanced brace order;spoke with Ferdinand LangoKanisha  Sumaiya Arruda 05/30/2015, 11:34 AM

## 2015-05-31 ENCOUNTER — Ambulatory Visit (HOSPITAL_COMMUNITY): Payer: Medicare HMO | Admitting: Physical Therapy

## 2015-05-31 ENCOUNTER — Inpatient Hospital Stay (HOSPITAL_COMMUNITY): Payer: Medicare HMO | Admitting: Occupational Therapy

## 2015-05-31 ENCOUNTER — Inpatient Hospital Stay (HOSPITAL_COMMUNITY): Payer: Medicare HMO | Admitting: Speech Pathology

## 2015-05-31 ENCOUNTER — Inpatient Hospital Stay (HOSPITAL_COMMUNITY): Payer: Medicare HMO | Admitting: Physical Therapy

## 2015-05-31 MED ORDER — LISINOPRIL 5 MG PO TABS
5.0000 mg | ORAL_TABLET | Freq: Every day | ORAL | Status: DC
  Administered 2015-05-31 – 2015-06-01 (×2): 5 mg via ORAL
  Filled 2015-05-31 (×2): qty 1

## 2015-05-31 NOTE — Progress Notes (Signed)
Social Work Patient ID: Luis Robinson, male   DOB: 08/30/1940, 75 y.o.   MRN: [id number] Family here for education-daughter and son to go through therapies with pt. Both reported it went well and feel they can mange him at home at discharge. Discussed equipment needs and fpllow up needs, no preference regarding home health agencies. Son will be the main caregiver and feels he can help manage  Pt's incontinence. Will work toward discharge 4/12.

## 2015-05-31 NOTE — Progress Notes (Signed)
Physical Therapy Weekly Progress Note  Patient Details  Name: Luis Robinson MRN: [id number] Date of Birth: 07/14/1940  Beginning of progress report period: May 25, 2015 End of progress report period: May 31, 2015  Patient has met 3 of 3 short term goals.  Pt has improved to overall min A with gait without AD and standing balance.  Mod I for bed mobility, supervision for transfers due to cognitive deficits.  Patient continues to demonstrate the following deficits: gait, balance, strength and therefore will continue to benefit from skilled PT intervention to enhance overall performance with activity tolerance, balance, ability to compensate for deficits, functional use of  right lower extremity and awareness.  Patient progressing toward long term goals..  Continue plan of care.  PT Short Term Goals Week 1:  PT Short Term Goal 1 (Week 1): Patient will performed bed mobility at mod I level.  PT Short Term Goal 1 - Progress (Week 1): Met PT Short Term Goal 2 (Week 1): Patient will perform bed<>chair and sit<>stand at suprevision level.  PT Short Term Goal 2 - Progress (Week 1): Met PT Short Term Goal 3 (Week 1): Patient will ambulate 150 with min A using LRAD  PT Short Term Goal 3 - Progress (Week 1): Met  Skilled Therapeutic Interventions/Progress Updates:  Ambulation/gait training;Balance/vestibular training;Cognitive remediation/compensation;Community reintegration;Discharge planning;Disease management/prevention;DME/adaptive equipment instruction;Functional electrical stimulation;Functional mobility training;Neuromuscular re-education;Pain management;Patient/family education;Psychosocial support;Skin care/wound management;Splinting/orthotics;Stair training;Therapeutic Activities;Therapeutic Exercise;UE/LE Strength taining/ROM;UE/LE Coordination activities;Visual/perceptual remediation/compensation;Wheelchair propulsion/positioning    See Function Navigator for Current Functional  Status.   Kandiss Ihrig 05/31/2015, 7:14 AM

## 2015-05-31 NOTE — Progress Notes (Signed)
Dan, PA made aware of patient's low BP. No new orders received at this time.  Nursing, PA, and MD to continue monitoring patient status.

## 2015-05-31 NOTE — Progress Notes (Addendum)
Subjective/Complaints: Pt without diziness, denies bowel problems but remains incont of stool  ROS limited aphasia, denies abd pain , N/V, -SOB Objective: Vital Signs: Blood pressure 102/64, pulse 63, temperature 98.9 F (37.2 C), temperature source Oral, resp. rate 18, weight 81.3 kg (179 lb 3.7 oz), SpO2 99 %. No results found. No results found for this or any previous visit (from the past 72 hour(s)).    General: No acute distress Mood and affect are appropriate Heart: Regular rate and rhythm no rubs murmurs or extra sounds Lungs: Clear to auscultation, breathing unlabored, no rales or wheezes Abdomen: Positive bowel sounds, soft nontender to palpation, nondistended Extremities: No clubbing, cyanosis, or edema Skin: No evidence of breakdown, no evidence of rash Neurologic: Cranial nerves II through XII intact, motor strength is 5/5 in bilateral deltoid, bicep, tricep, grip, hip flexor, knee extensors,Left  ankle dorsiflexor and plantar flexor, 2- R ankle DF/PF Sensory exam cannot assess due to aphasia Good receptive but imparied exp language Musculoskeletal: Full range of motion in all 4 extremities. No joint swelling   Assessment/Plan: 1. Functional deficits secondary to Left MCA infarct with aphasia and gait d/o which require 3+ hours per day of interdisciplinary therapy in a comprehensive inpatient rehab setting. Physiatrist is providing close team supervision and 24 hour management of active medical problems listed below. Physiatrist and rehab team continue to assess barriers to discharge/monitor patient progress toward functional and medical goals. FIM: Function - Bathing Position: Shower Body parts bathed by patient: Buttocks, Front perineal area, Right arm, Left arm, Chest, Abdomen, Right upper leg, Left upper leg, Right lower leg, Left lower leg Body parts bathed by helper: Back, Left lower leg, Right lower leg Bathing not applicable: Right arm, Left arm, Chest,  Abdomen, Right upper leg, Left upper leg, Right lower leg, Left lower leg, Back Assist Level: Touching or steadying assistance(Pt > 75%)  Function- Upper Body Dressing/Undressing What is the patient wearing?: Pull over shirt/dress Pull over shirt/dress - Perfomed by patient: Thread/unthread right sleeve, Thread/unthread left sleeve, Put head through opening, Pull shirt over trunk Assist Level: Set up Set up : To obtain clothing/put away Function - Lower Body Dressing/Undressing What is the patient wearing?: Underwear, Pants, Shoes, Socks Position: Sitting EOB Underwear - Performed by patient: Thread/unthread left underwear leg, Pull underwear up/down Underwear - Performed by helper: Thread/unthread right underwear leg Pants- Performed by patient: Thread/unthread right pants leg, Thread/unthread left pants leg, Pull pants up/down Pants- Performed by helper: Thread/unthread right pants leg, Thread/unthread left pants leg, Pull pants up/down Non-skid slipper socks- Performed by patient: Don/doff right sock, Don/doff left sock Non-skid slipper socks- Performed by helper: Don/doff right sock, Don/doff left sock Socks - Performed by patient: Don/doff right sock, Don/doff left sock Shoes - Performed by patient: Don/doff right shoe, Don/doff left shoe, Fasten right, Fasten left Assist for footwear: Dependant Assist for lower body dressing: Touching or steadying assistance (Pt > 75%)  Function - Toileting Toileting activity did not occur: No continent bowel/bladder event Toileting steps completed by patient: Adjust clothing prior to toileting Toileting steps completed by helper: Performs perineal hygiene, Adjust clothing after toileting Assist level: Touching or steadying assistance (Pt.75%)  Function - ArchivistToilet Transfers Toilet transfer activity did not occur: Safety/medical concerns Toilet transfer assistive device: Grab bar, Walker Assist level to toilet: Touching or steadying assistance (Pt >  75%) Assist level from toilet: Touching or steadying assistance (Pt > 75%)  Function - Chair/bed transfer Chair/bed transfer method: Stand pivot Chair/bed transfer assist level:  Touching or steadying assistance (Pt > 75%) Chair/bed transfer assistive device: Walker Chair/bed transfer details: Verbal cues for precautions/safety, Verbal cues for technique  Function - Locomotion: Wheelchair Will patient use wheelchair at discharge?: No Type: Manual Max wheelchair distance: 25' Assist Level: Touching or steadying assistance (Pt > 75%) Assist Level: Touching or steadying assistance (Pt > 75%) Wheel 150 feet activity did not occur: Safety/medical concerns Turns around,maneuvers to table,bed, and toilet,negotiates 3% grade,maneuvers on rugs and over doorsills: No Function - Locomotion: Ambulation Assistive device: Walker-rolling Max distance: 100 Assist level: Moderate assist (Pt 50 - 74%) Assist level: Moderate assist (Pt 50 - 74%) Assist level: Moderate assist (Pt 50 - 74%) Walk 150 feet activity did not occur: Safety/medical concerns Assist level: Supervision or verbal cues Assist level: Touching or steadying assistance (Pt > 75%)  Function - Comprehension Comprehension: Auditory Comprehension assist level: Understands basic 75 - 89% of the time/ requires cueing 10 - 24% of the time  Function - Expression Expression: Verbal Expression assist level: Expresses basic 50 - 74% of the time/requires cueing 25 - 49% of the time. Needs to repeat parts of sentences.  Function - Social Interaction Social Interaction assist level: Interacts appropriately 75 - 89% of the time - Needs redirection for appropriate language or to initiate interaction.  Function - Problem Solving Problem solving assist level: Solves basic 50 - 74% of the time/requires cueing 25 - 49% of the time  Function - Memory Memory assist level: Recognizes or recalls 25 - 49% of the time/requires cueing 50 - 75% of the  time Patient normally able to recall (first 3 days only): None of the above  Medical Problem List and Plan: 1.  Right hemiparesis/aphasia  secondary to left MCA infarct secondary to left ICA /MCA occlusion status post revascularization with mechanical thrombectomy               -set D/C date of 4/12, Right foot drop from CVA, AFO ordered 2.  DVT Prophylaxis/Anticoagulation: SCDs   3. Pain Management/history of multiple back surgeries: Tylenol as needed               -pain appears controlled at present 4. Dysphagia. Dysphagia #2 thin liquids. Follow-up speech therapy and advance as appropriate, no signs of aspiration 5. Neuropsych: This patient is  capable of making decisions on his  own behalf, pt calm last noc 6. Skin/Wound Care: Routine skin checks   7. Fluids/Electrolytes/Nutrition: Routine I&O with follow-up chemistries upon admit, wt stable at 82Kg 8. Atrial fibrillation/hypertropic cardiomyopathy/pacemaker. Follow-up per cardiology services. Heart rate controlled at present. Continue amiodarone  decrease to 200 mg twice daily today and then after 2 weeks decrease to 200 mg daily 9. Hypertension. Lisinopril 10 mg daily,BPs running low will reduce , metoprolol 12.5mg  BID . Monitor with increased mobility 10. Hyperlipidemia. Crestor 11. Pulmonary edema. Improved after IV lasix, now on po lasix,maintaining sats off O2 will d/c 12.  Aphasia- SLP eval 13.  Increased stooling but stools are formed ,incont of stool, pt not aware of the problem LOS (Days) 7 A FACE TO FACE EVALUATION WAS PERFORMED  Luis Robinson E 05/31/2015, 7:03 AM

## 2015-05-31 NOTE — Progress Notes (Signed)
Speech Language Pathology Weekly Progress and Session Note  Patient Details  Name: Luis Robinson MRN: [id number] Date of Birth: Jul 04, 1940  Beginning of progress report period: March, 31, 2017   End of progress report period: May 31, 2015   Today's Date: 05/31/2015 SLP Individual Time: 0800-0900 SLP Individual Time Calculation (min): 60 min  Short Term Goals: Week 1: SLP Short Term Goal 1 (Week 1): Pt demonstrate ability to answer simple Y/N questions  accurately with mod A. SLP Short Term Goal 1 - Progress (Week 1): Met SLP Short Term Goal 2 (Week 1): Pt demonstrate reading comprehension at the single word level with min A. SLP Short Term Goal 2 - Progress (Week 1): Met SLP Short Term Goal 3 (Week 1): Pt demonstrate O x 4 with mod A. SLP Short Term Goal 3 - Progress (Week 1): Progressing toward goal SLP Short Term Goal 4 (Week 1): Pt demonstrate ability to follow 2-step directives with mod A.  SLP Short Term Goal 4 - Progress (Week 1): Met SLP Short Term Goal 5 (Week 1): Pt demonstrate object naming to identifiable approximation with mod A. SLP Short Term Goal 5 - Progress (Week 1): Met SLP Short Term Goal 6 (Week 1): Pt tolerate trials of regular texture without oral residue with min A. SLP Short Term Goal 6 - Progress (Week 1): Met    New Short Term Goals: Week 2: SLP Short Term Goal 1 (Week 2): Pt demonstrate ability to answer simple Y/N questions for 75% accuracy with min A verbal cues . SLP Short Term Goal 2 (Week 2): Pt demonstrate reading comprehension at the phrase level with min A. SLP Short Term Goal 3 (Week 2): Pt demonstrate O x 4 with mod A. SLP Short Term Goal 4 (Week 2): Pt demonstrate ability to follow 2-step directives with supervision.  SLP Short Term Goal 5 (Week 2): Pt will complete phrase closure tasks at the word  level with min assist verbal cues for 75% accuracy.  SLP Short Term Goal 6 (Week 2): Pt will clear oral residue and utilize rate and portion control  with presentations of regular textures with mod I.    Weekly Progress Updates: Pt made functional gains this reporting period and has met 5 out of 6 short term goals.  Pt's diet has been upgraded to regular textures, thin liquids with intermittent supervision for use of swallowing precautions.  Pt currently requires overall min assist for word level expression but requires overall mod assist at the phrase level for awareness of verbal errors.  Pt can answer yes/no questions and follow 2 step directives with mod assist verbal and visual cues.  Pt and family education is ongoing.  Continue to recommend ST follow up at next level of care and 24/7 supervision at discharge which family reports they will be able to provide.  Pt would continue to benefit from skilled ST while inpatient in order to maximize functional independence and reduce burden of care prior to discharge home.     Intensity: Minumum of 1-2 x/day, 30 to 90 minutes Frequency: 3 to 5 out of 7 days Duration/Length of Stay: 7-10 days Treatment/Interventions: Cognitive remediation/compensation;Multimodal communication approach;Speech/Language facilitation;Cueing hierarchy;Functional tasks;Therapeutic Activities;Patient/family education;Dysphagia/aspiration precaution training   Daily Session  Skilled Therapeutic Interventions: Pt was seen for skilled ST targeting goals for dysphagia and family education.  SLP attempted skilled observations during a trial meal tray of regular textures but wrong tray was delivered.  Pt demonstrated appropriate rate and portion control during meal  and was able to clear residuals from the oral cavity without difficulty.  Given pt's performance during trials of regular textures during previous therapy sessions and improved use of universal swallowing precautions, recommend diet advancement to regular textures and thin liquids.  SLP will continue to follow up for toleration.  Pt's daughter and son were present during  today's therapy session and remained actively engaged in training.  SLP provided skilled education regarding strategies to maximize pt's awareness of verbal errors and comprehension of basic information, including use of visual and gestural cues and short, simplified phrases or sentences.  SLP also provided skilled instruction regarding techniques to elicit functional communication, including sentence completion cues and allowing pt extra time for initiation of requests.  All pt's and family's questions were answered to their satisfaction at this time.  Pt was left sitting at edge of bed with family present,  Handed off to PT.  Goals updated on this date to reflect current progress and plan of care.       Function:   Eating Eating   Modified Consistency Diet: No Eating Assist Level: Swallowing techniques: self managed           Cognition Comprehension Comprehension assist level: Understands basic 75 - 89% of the time/ requires cueing 10 - 24% of the time  Expression   Expression assist level: Expresses basic 50 - 74% of the time/requires cueing 25 - 49% of the time. Needs to repeat parts of sentences.  Social Interaction Social Interaction assist level: Interacts appropriately 75 - 89% of the time - Needs redirection for appropriate language or to initiate interaction.  Problem Solving Problem solving assist level: Solves basic 50 - 74% of the time/requires cueing 25 - 49% of the time  Memory Memory assist level: Recognizes or recalls 25 - 49% of the time/requires cueing 50 - 75% of the time   Pain Pain Assessment Pain Assessment: No/denies pain  Therapy/Group: Individual Therapy  Tammra Pressman, Selinda Orion 05/31/2015, 12:18 PM

## 2015-05-31 NOTE — Progress Notes (Addendum)
Physical Therapy Note  Patient Details  Name: Luis Robinson MRN: [id number] Date of Birth: Jun 22, 1940 Today's Date: 05/31/2015    Time: 900-928 28 minutes  1:1 No c/o pain. Session focused on pt/family education with pt's son and daughter. Pt and son educated on and safely demo'd stair negotiation, car transfers, gait in controlled and home environment and curb step negotiation and min A to close supervision level.  Pt's son and daughter state they feel safe to care for pt at this level at home.  Pt/family educated on PT recommendations for assist with mobility at home and HHPT for continued strengthening and balance  Time 2: 1300-1354 54 minutes  1:1 No c/o pain.  Gait with new AFO provided by orthotist. Pt requires assistance to don but able to doff without assist.  Gait in controlled environment with min-supervision assist depending on attention to task and fatigue level. Pt still with shortness of breath with all activity.  Gait training 2 x 200' with focus on deep breathing to prevent shortness of breath. Nustep x 8 minutes level 4 with HR 100bpm, cues for breathing techniques.  Stair negotiation with 1 handrail to simulate home environment with min A for balance and getting entire foot on stair.  Standing balance on foam with UE and LE movements with mod A for dynamic standing balance.  Pt continues to be limited by shortness of breath but continues to make improvements in balance and gait.   Lyndsy Gilberto 05/31/2015, 9:29 AM

## 2015-05-31 NOTE — Progress Notes (Signed)
Occupational Therapy Session Note  Patient Details  Name: Luis Robinson MRN: [id number] Date of Birth: 1940/08/15  Today's Date: 05/31/2015 OT Individual Time: [id number] OT Individual Time Calculation (min): 60 min    Short Term Goals: Week 1:  OT Short Term Goal 1 (Week 1): STGs=LTGs due to short ELOS Week 2:  OT Short Term Goal 1 (Week 2): Continue working on [employer] supervision level goals.    Skilled Therapeutic Interventions/Progress Updates:    Pt seen for family education with his son and daughter during ADL retraining. Pt opted to not shower or bathe today.  Pt's family able to learn strategies to facilitate pt's problem solving by allowing him time to self correct. For example, he was having some difficulty donning underwear but was able to figure it out within a few minutes.  Pt stated he did not need to toilet. Pt asked to perform a toilet transfer. Pt sat down on toilet with clothing on. When cued to pull pants down and just try to toilet, his brief was wet with urine only but pt able to continue to urinate in toilet. Discussed with family a toileting schedule at home, and discussed with nursing taking him to toilet every 2 hrs even if he says he does not need to go. Reviewed with son cleansing strategies if pt is soiled with BM. Pt can cleanse himself with cues.   Pt was overall S and did well with expression during conversation.  Practiced stepping over shower ledge of 3" with S. Daughter will provide S in the evenings with his showers.   Pt practiced [place] fall prevention exercises, HEP provided to family. Pt did need a short rest break after 2 sets of exercises but was able to complete all 5. Reviewed with family the need for constant S. They both return demonstrated assisting him with transfers.  Family states they understand level of S needed.  Pt in room with family at the end of session.  Therapy Documentation Precautions:  Precautions Precautions: Fall Precaution  Comments: R foot drop  Restrictions Weight Bearing Restrictions: No     Pain: Pain Assessment Pain Assessment: No/denies pain ADL:   See Function Navigator for Current Functional Status.   Therapy/Group: Individual Therapy  SAGUIER,JULIA 05/31/2015, 12:55 PM

## 2015-05-31 NOTE — Progress Notes (Signed)
Social Work Patient ID: Thereasa SoloKenneth Haag, male   DOB: [birth year], 75 y.o.   MRN: [id number] [facility], await return contact. Family is here for education with Dad today.

## 2015-05-31 NOTE — Progress Notes (Signed)
Nutrition Follow-up  DOCUMENTATION CODES:   Not applicable  INTERVENTION:  Continue Ensure Enlive po once daily, each supplement provides 350 kcal and 20 grams of protein.  Encourage adequate PO intake.  NUTRITION DIAGNOSIS:   Increased nutrient needs related to  (therapy) as evidenced by estimated needs; ongoing  GOAL:   Patient will meet greater than or equal to 90% of their needs; met  MONITOR:   PO intake, Supplement acceptance, Weight trends, Labs, I & O's  REASON FOR ASSESSMENT:   Malnutrition Screening Tool    ASSESSMENT:   75 y.o. male with history of Hypertrophic cardiomyopathy/PAF with ablation/pacemaker maintained on Eliquis and followed by Dr. Bettina Gavia at [facility], HTN, tobacco abuse, back surgery 2 with antalgic gait who was admitted on 05/20/15 after fall with acute onset of right sided weakness with inability to speak. 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   Meal completion has been 75-100% with most intake at 100%. Appetite good. Pt currently has Ensure ordered once daily and has been consuming them. RD to continue with current orders.   Diet Order:  Diet regular Room service appropriate?: Yes; Fluid consistency:: Thin  Skin:  Reviewed, no issues  Last BM:  4/6  Height:   Ht Readings from Last 1 Encounters:  05/21/15 6' (1.829 m)    Weight:   Wt Readings from Last 1 Encounters:  05/31/15 179 lb 3.7 oz (81.3 kg)    Ideal Body Weight:  80.9 kg  BMI:  Body mass index is 24.3 kg/(m^2).  Estimated Nutritional Needs:   Kcal:  1900-2100  Protein:  100-110 grams  Fluid:  1.9 - 2.1 L/day  EDUCATION NEEDS:   No education needs identified at this time  Corrin Parker, MS, RD, LDN Pager # [phone] After hours/ weekend pager # [phone]

## 2015-06-01 ENCOUNTER — Ambulatory Visit (HOSPITAL_COMMUNITY): Payer: Medicare HMO | Admitting: Physical Therapy

## 2015-06-01 DIAGNOSIS — I639 Cerebral infarction, unspecified: Secondary | ICD-10-CM | POA: Insufficient documentation

## 2015-06-01 DIAGNOSIS — I63312 Cerebral infarction due to thrombosis of left middle cerebral artery: Secondary | ICD-10-CM

## 2015-06-01 NOTE — Progress Notes (Signed)
Physical Therapy Session Note  Patient Details  Name: Luis Robinson MRN: [id number] Date of Birth: 01-Jun-1940  Today's Date: 06/01/2015 PT Group Time: 1840-3754 PT Group Time Calculation (min): 57 min  Short Term Goals: Week 1:  PT Short Term Goal 1 (Week 1): Patient will performed bed mobility at mod I level.  PT Short Term Goal 1 - Progress (Week 1): Met PT Short Term Goal 2 (Week 1): Patient will perform bed<>chair and sit<>stand at suprevision level.  PT Short Term Goal 2 - Progress (Week 1): Met PT Short Term Goal 3 (Week 1): Patient will ambulate 150 with min A using LRAD  PT Short Term Goal 3 - Progress (Week 1): Met  Skilled Therapeutic Interventions/Progress Updates:   Pt participated in Stride Right walking group with focus on LE strengthening and endurance on Nustep at level 6 resistance x 8 minutes with bilat UE and LE, gait with RW and R orthosis in controlled environment with verbal cues for upright posture, safe distance to RW, full weight shift and stance time on LLE with full foot clearance and step length RLE, stair negotiation training for home with pt performing up/down 12 stairs with 2 rails with min A and max verbal cues for safe sequence and attention to R foot placement as well as problem solving placement of RW while pt negotiating stairs, car transfer training with simulated SUV with pt requiring min A for balance when entering car due to stepping into car vs. Sitting first, and higher level gait training with R and L lateral and retro gait without UE support but with min A to facilitate full and controlled weight shifting of COG.  Returned to room and pt left sitting EOB with bed alarm on.  Therapy Documentation Precautions:  Precautions Precautions: Fall Precaution Comments: R foot drop  Restrictions Weight Bearing Restrictions: No Vital Signs: Therapy Vitals Temp: 98.3 F (36.8 C) Temp Source: Oral Pulse Rate: 66 Resp: 18 BP: (!) 93/43 mmHg (rn notified,  pt had just woken from a nap) Patient Position (if appropriate): Lying Oxygen Therapy SpO2: 98 % O2 Device: Not Delivered  See Function Navigator for Current Functional Status.   Therapy/Group: Group Therapy  Raylene Everts Faucette 06/01/2015, 3:40 PM

## 2015-06-01 NOTE — Progress Notes (Signed)
Subjective/Complaints: Patient seen this morning lying in bed. He slept well overnight.  ROS limited due to aphasia, but appears to deny abd pain , N/V, -SOB  Objective: Vital Signs: Blood pressure 124/68, pulse 71, temperature 97.4 F (36.3 C), temperature source Oral, resp. rate 18, weight 82.3 kg (181 lb 7 oz), SpO2 97 %. No results found. No results found for this or any previous visit (from the past 72 hour(s)).    General: No acute distress. Vital signs reviewed Psych: Mood and affect are appropriate Heart: Regular rate and rhythm no rubs murmurs or extra sounds Lungs: Clear to auscultation, breathing unlabored, no rales or wheezes Abdomen: Positive bowel sounds, soft nontender to palpation, nondistended Skin: No evidence of breakdown, no evidence of rash Neurologic:  Motor strength is 5/5 in bilateral deltoid, bicep, tricep, grip, hip flexor, knee extensors, left  ankle dorsiflexor and plantar flexor 2/5 R ankle DF/PF Good receptive but imparied exp language Musculoskeletal: No edema. No tenderness  Assessment/Plan: 1. Functional deficits secondary to Left MCA infarct with aphasia and gait d/o which require 3+ hours per day of interdisciplinary therapy in a comprehensive inpatient rehab setting. Physiatrist is providing close team supervision and 24 hour management of active medical problems listed below. Physiatrist and rehab team continue to assess barriers to discharge/monitor patient progress toward functional and medical goals. FIM: Function - Bathing Position: Shower Body parts bathed by patient: Buttocks, Front perineal area, Right arm, Left arm, Chest, Abdomen, Right upper leg, Left upper leg, Right lower leg, Left lower leg Body parts bathed by helper: Back, Left lower leg, Right lower leg Bathing not applicable: Right arm, Left arm, Chest, Abdomen, Right upper leg, Left upper leg, Right lower leg, Left lower leg, Back Assist Level: Touching or steadying  assistance(Pt > 75%)  Function- Upper Body Dressing/Undressing What is the patient wearing?: Pull over shirt/dress Pull over shirt/dress - Perfomed by patient: Thread/unthread right sleeve, Thread/unthread left sleeve, Put head through opening, Pull shirt over trunk Assist Level: Supervision or verbal cues Set up : To obtain clothing/put away Function - Lower Body Dressing/Undressing What is the patient wearing?: Underwear, Pants, Non-skid slipper socks Position: Sitting EOB Underwear - Performed by patient: Thread/unthread left underwear leg, Pull underwear up/down, Thread/unthread right underwear leg Underwear - Performed by helper: Thread/unthread right underwear leg Pants- Performed by patient: Thread/unthread right pants leg, Thread/unthread left pants leg, Pull pants up/down Pants- Performed by helper: Thread/unthread right pants leg, Thread/unthread left pants leg, Pull pants up/down Non-skid slipper socks- Performed by patient: Don/doff right sock, Don/doff left sock Non-skid slipper socks- Performed by helper: Don/doff right sock, Don/doff left sock Socks - Performed by patient: Don/doff right sock, Don/doff left sock Shoes - Performed by patient: Don/doff right shoe, Don/doff left shoe, Fasten right, Fasten left Assist for footwear: Dependant Assist for lower body dressing: Touching or steadying assistance (Pt > 75%)  Function - Toileting Toileting activity did not occur: No continent bowel/bladder event Toileting steps completed by patient: Adjust clothing prior to toileting, Performs perineal hygiene, Adjust clothing after toileting Toileting steps completed by helper: Performs perineal hygiene, Adjust clothing after toileting Assist level: Supervision or verbal cues  Function - ArchivistToilet Transfers Toilet transfer activity did not occur: Safety/medical concerns Toilet transfer assistive device: Grab bar Assist level to toilet: Supervision or verbal cues Assist level from  toilet: Supervision or verbal cues  Function - Chair/bed transfer Chair/bed transfer method: Stand pivot Chair/bed transfer assist level: Touching or steadying assistance (Pt > 75%) Chair/bed transfer  assistive device: Walker Chair/bed transfer details: Verbal cues for precautions/safety, Verbal cues for technique  Function - Locomotion: Wheelchair Will patient use wheelchair at discharge?: No Type: Manual Max wheelchair distance: 25' Assist Level: Touching or steadying assistance (Pt > 75%) Assist Level: Touching or steadying assistance (Pt > 75%) Wheel 150 feet activity did not occur: Safety/medical concerns Turns around,maneuvers to table,bed, and toilet,negotiates 3% grade,maneuvers on rugs and over doorsills: No Function - Locomotion: Ambulation Assistive device: Walker-rolling Max distance: 100 Assist level: Moderate assist (Pt 50 - 74%) Assist level: Moderate assist (Pt 50 - 74%) Assist level: Moderate assist (Pt 50 - 74%) Walk 150 feet activity did not occur: Safety/medical concerns Assist level: Supervision or verbal cues Assist level: Touching or steadying assistance (Pt > 75%)  Function - Comprehension Comprehension: Auditory Comprehension assist level: Understands basic 75 - 89% of the time/ requires cueing 10 - 24% of the time  Function - Expression Expression: Verbal Expression assist level: Expresses basic 50 - 74% of the time/requires cueing 25 - 49% of the time. Needs to repeat parts of sentences.  Function - Social Interaction Social Interaction assist level: Interacts appropriately 75 - 89% of the time - Needs redirection for appropriate language or to initiate interaction.  Function - Problem Solving Problem solving assist level: Solves basic 50 - 74% of the time/requires cueing 25 - 49% of the time  Function - Memory Memory assist level: Recognizes or recalls 25 - 49% of the time/requires cueing 50 - 75% of the time Patient normally able to recall (first  3 days only): None of the above  Medical Problem List and Plan: 1.  Right hemiparesis/aphasia  secondary to left MCA infarct secondary to left ICA /MCA occlusion status post revascularization with mechanical thrombectomy               -set D/C date of 4/12, Right foot drop from CVA, AFO ordered 2.  DVT Prophylaxis/Anticoagulation: SCDs   3. Pain Management/history of multiple back surgeries: Tylenol as needed               -pain appears controlled at present 4. Dysphagia. Dysphagia #2 thin liquids, advanced to regular diet.   no signs of aspiration 5. Neuropsych: This patient is  not capable of making decisions on his  own behalf 6. Skin/Wound Care: Routine skin checks   7. Fluids/Electrolytes/Nutrition: Routine I&O  wt stable at 82Kg 8. Atrial fibrillation/hypertropic cardiomyopathy/pacemaker. Follow-up per cardiology services. Heart rate controlled at present. amiodarone  decrease to 200 mg twice daily on 4/4 and then after 2 weeks decrease to 200 mg daily 9. Hypertension. Lisinopril 10 mg daily, decreased to 5 mg on 4/7, metoprolol 12.5mg  BID . Monitor with increased mobility 10. Hyperlipidemia. Crestor 11. Pulmonary edema. Improved after IV lasix, now on po lasix,maintaining sats off O2 will d/c 12.  Aphasia- SLP eval 13.  Increased stooling but stools are formed   No incontinence reported over last 24 hours  LOS (Days) 8 A FACE TO FACE EVALUATION WAS PERFORMED  Inioluwa Baris Karis Juba 06/01/2015, 7:11 AM

## 2015-06-02 ENCOUNTER — Inpatient Hospital Stay (HOSPITAL_COMMUNITY): Payer: Medicare HMO

## 2015-06-02 DIAGNOSIS — R030 Elevated blood-pressure reading, without diagnosis of hypertension: Secondary | ICD-10-CM

## 2015-06-02 DIAGNOSIS — R159 Full incontinence of feces: Secondary | ICD-10-CM | POA: Insufficient documentation

## 2015-06-02 DIAGNOSIS — R0989 Other specified symptoms and signs involving the circulatory and respiratory systems: Secondary | ICD-10-CM | POA: Insufficient documentation

## 2015-06-02 MED ORDER — LISINOPRIL 5 MG PO TABS
2.5000 mg | ORAL_TABLET | Freq: Every day | ORAL | Status: DC
  Administered 2015-06-02 – 2015-06-04 (×2): 2.5 mg via ORAL
  Filled 2015-06-02 (×3): qty 1

## 2015-06-02 NOTE — Progress Notes (Signed)
Subjective/Complaints: Patient seen this morning lying in bed. He slept well overnight. His expressive aphasia appears to be slightly improved today.  ROS denies CP, SOB, abd pain , N/V,   Objective: Vital Signs: Blood pressure 105/47, pulse 68, temperature 97.9 F (36.6 C), temperature source Oral, resp. rate 18, weight 82.4 kg (181 lb 10.5 oz), SpO2 98 %. No results found. No results found for this or any previous visit (from the past 72 hour(s)).    General: No acute distress. Vital signs reviewed Psych: Mood and affect are appropriate Heart: Regular rate and rhythm no rubs murmurs or extra sounds Lungs: Clear to auscultation, breathing unlabored, no rales or wheezes Abdomen: Positive bowel sounds, soft nontender to palpation, nondistended Skin: No evidence of breakdown, no evidence of rash Neurologic:  Motor strength is 5/5 in bilateral deltoid, bicep, tricep, grip, hip flexor, knee extensors, left  ankle dorsiflexor and plantar flexor 2/5 R ankle DF/PF Good receptive but imparied exp language (improved today) Musculoskeletal: No edema. No tenderness  Assessment/Plan: 1. Functional deficits secondary to Left MCA infarct with aphasia and gait d/o which require 3+ hours per day of interdisciplinary therapy in a comprehensive inpatient rehab setting. Physiatrist is providing close team supervision and 24 hour management of active medical problems listed below. Physiatrist and rehab team continue to assess barriers to discharge/monitor patient progress toward functional and medical goals. FIM: Function - Bathing Position: Shower Body parts bathed by patient: Buttocks, Front perineal area, Right arm, Left arm, Chest, Abdomen, Right upper leg, Left upper leg, Right lower leg, Left lower leg Body parts bathed by helper: Back, Left lower leg, Right lower leg Bathing not applicable: Right arm, Left arm, Chest, Abdomen, Right upper leg, Left upper leg, Right lower leg, Left lower leg,  Back Assist Level: Touching or steadying assistance(Pt > 75%)  Function- Upper Body Dressing/Undressing What is the patient wearing?: Pull over shirt/dress Pull over shirt/dress - Perfomed by patient: Thread/unthread right sleeve, Thread/unthread left sleeve, Put head through opening, Pull shirt over trunk Assist Level: Supervision or verbal cues Set up : To obtain clothing/put away Function - Lower Body Dressing/Undressing What is the patient wearing?: Underwear, Pants, Non-skid slipper socks Position: Sitting EOB Underwear - Performed by patient: Thread/unthread left underwear leg, Pull underwear up/down, Thread/unthread right underwear leg Underwear - Performed by helper: Thread/unthread right underwear leg Pants- Performed by patient: Thread/unthread right pants leg, Thread/unthread left pants leg, Pull pants up/down Pants- Performed by helper: Thread/unthread right pants leg, Thread/unthread left pants leg, Pull pants up/down Non-skid slipper socks- Performed by patient: Don/doff right sock, Don/doff left sock Non-skid slipper socks- Performed by helper: Don/doff right sock, Don/doff left sock Socks - Performed by patient: Don/doff right sock, Don/doff left sock Shoes - Performed by patient: Don/doff right shoe, Don/doff left shoe, Fasten right, Fasten left Assist for footwear: Dependant Assist for lower body dressing: Touching or steadying assistance (Pt > 75%)  Function - Toileting Toileting activity did not occur: No continent bowel/bladder event Toileting steps completed by patient: Adjust clothing prior to toileting, Adjust clothing after toileting Toileting steps completed by helper: Adjust clothing after toileting Assist level: Supervision or verbal cues  Function - Archivist transfer activity did not occur: Safety/medical concerns Toilet transfer assistive device: Grab bar Assist level to toilet: Supervision or verbal cues Assist level from toilet:  Supervision or verbal cues  Function - Chair/bed transfer Chair/bed transfer method: Stand pivot, Ambulatory Chair/bed transfer assist level: Supervision or verbal cues Chair/bed transfer assistive device:  Walker, Orthosis Chair/bed transfer details: Verbal cues for precautions/safety, Verbal cues for technique  Function - Locomotion: Wheelchair Will patient use wheelchair at discharge?: No Type: Manual Max wheelchair distance: 25' Assist Level: Touching or steadying assistance (Pt > 75%) Assist Level: Touching or steadying assistance (Pt > 75%) Wheel 150 feet activity did not occur: Safety/medical concerns Turns around,maneuvers to table,bed, and toilet,negotiates 3% grade,maneuvers on rugs and over doorsills: No Function - Locomotion: Ambulation Assistive device: Walker-rolling, Orthosis Max distance: 150 Assist level: Supervision or verbal cues Assist level: Supervision or verbal cues Assist level: Supervision or verbal cues Walk 150 feet activity did not occur: Safety/medical concerns Assist level: Supervision or verbal cues Assist level: Touching or steadying assistance (Pt > 75%)  Function - Comprehension Comprehension: Auditory Comprehension assist level: Understands basic 75 - 89% of the time/ requires cueing 10 - 24% of the time  Function - Expression Expression: Verbal Expression assist level: Expresses basic 50 - 74% of the time/requires cueing 25 - 49% of the time. Needs to repeat parts of sentences.  Function - Social Interaction Social Interaction assist level: Interacts appropriately 75 - 89% of the time - Needs redirection for appropriate language or to initiate interaction.  Function - Problem Solving Problem solving assist level: Solves basic 50 - 74% of the time/requires cueing 25 - 49% of the time  Function - Memory Memory assist level: Recognizes or recalls 25 - 49% of the time/requires cueing 50 - 75% of the time Patient normally able to recall (first 3  days only): None of the above  Medical Problem List and Plan: 1.  Right hemiparesis/aphasia  secondary to left MCA infarct secondary to left ICA /MCA occlusion status post revascularization with mechanical thrombectomy               -set D/C date of 4/12, Right foot drop from CVA, AFO ordered 2.  DVT Prophylaxis/Anticoagulation: SCDs   3. Pain Management/history of multiple back surgeries: Tylenol as needed               -pain appears controlled at present 4. Dysphagia. Dysphagia #2 thin liquids, advanced to regular diet.   no signs of aspiration 5. Neuropsych: This patient is  not capable of making decisions on his  own behalf 6. Skin/Wound Care: Routine skin checks   7. Fluids/Electrolytes/Nutrition: Routine I&O  wt stable at 82Kg 8. Atrial fibrillation/hypertropic cardiomyopathy/pacemaker. Follow-up per cardiology services. Heart rate controlled at present. amiodarone  decrease to 200 mg twice daily on 4/4 and then after 2 weeks decrease to 200 mg daily 9. Hypertension. Lisinopril 10 mg daily, decreased to 5 mg on 4/7, decreased to 2.5 on 4/9, metoprolol 12.5mg  BID . Monitor with increased mobility 10. Hyperlipidemia. Crestor 11. Pulmonary edema. Improved after IV lasix, now on po lasix,maintaining sats off O2 will d/c 12.  Aphasia- SLP eval 13.  Increased stooling but stools are formed   Incontinence appears to be intermittent  LOS (Days) 9 A FACE TO FACE EVALUATION WAS PERFORMED  Luis Robinson 06/02/2015, 6:51 AM

## 2015-06-02 NOTE — Progress Notes (Signed)
Physical Therapy Session Note  Patient Details  Name: Luis Robinson MRN: [id number] Date of Birth: Nov 03, 1940  Today's Date: 06/02/2015 PT Individual Time: 0865-7846 PT Individual Time Calculation (min): 59 min   Short Term Goals: Week 1:  PT Short Term Goal 1 (Week 1): Patient will performed bed mobility at mod I level.  PT Short Term Goal 1 - Progress (Week 1): Met PT Short Term Goal 2 (Week 1): Patient will perform bed<>chair and sit<>stand at suprevision level.  PT Short Term Goal 2 - Progress (Week 1): Met PT Short Term Goal 3 (Week 1): Patient will ambulate 150 with min A using LRAD  PT Short Term Goal 3 - Progress (Week 1): Met  Skilled Therapeutic Interventions/Progress Updates:    Pt received in bed & agreeable to PT, denying c/o pain. Pt transferred to supine>sit with HOB elevated & Mod I. Pt ambulated room>gym ~100 ft with RW & steady A. Pt required assistance to don R AFO. Completed stair training for BLE strengthening & to work on [place]. Pt utilized R railing only, per pt report this is what he has at home, by holding on with 1 UE then with BUE. Pt required tactile reminders to use only R ascending rail to help simulate home environment. PT provided pt with cuing to ascend steps leading with LLE & descend leading with RLE for safety but pt continues to ascend leading with RLE & descend with LLE & PT did not observe any loss of balance during activity. Pt also becomes fearful, with increased respirations & heavy breathing, when descending stairs with single rail. PT reinforced to pt that he is doing well and his legs support him well. Utilized biodex Limits of Stability for weight shifting & balance training. Pt with most difficulty shifting weight anteriorly; pt completed task with 1 UE support then without any BUE support. Gait training completed without AD for 130 ft x 2 with Steady A; pt looks down at floor and ambulates with short step length BLE. Pt required rest breaks  between each trial 2/2 fatigue; focus of task is to increase pt's dynamic balance during gait. Pt ambulated 5 ft more without AD with steady A before standing on rocker board in parallel bars. Rocker board positioned so that pt would have lateral challenges to balance. PT provided cuing for weight shifting to L & R when losing balance in order to find center of gravity again. Pt eventually able to weight shift with minimal tactile cuing from PT. Pt requested rest break then ambulated back to room ~140 ft with RW & supervision A; during gt training pt completed furniture transfer form couch in rehab apartment with supervision A. At end of session pt left in bed with all needs within reach & bed alarm set. Throughout treatment PT instructed pt on need to square up to chair and bring RW all the way back with him before transferring stand>sit with pt requiring continued cuing throughout session.  Therapy Documentation Precautions:  Precautions Precautions: Fall Precaution Comments: R foot drop  Restrictions Weight Bearing Restrictions: No  Pain: Pain Assessment Pain Assessment: No/denies pain   See Function Navigator for Current Functional Status.   Therapy/Group: Individual Therapy  Luis Robinson 06/02/2015, 11:34 AM

## 2015-06-03 ENCOUNTER — Inpatient Hospital Stay (HOSPITAL_COMMUNITY): Payer: Medicare HMO | Admitting: Speech Pathology

## 2015-06-03 ENCOUNTER — Inpatient Hospital Stay (HOSPITAL_COMMUNITY): Payer: Medicare HMO

## 2015-06-03 ENCOUNTER — Inpatient Hospital Stay (HOSPITAL_COMMUNITY): Payer: Medicare HMO | Admitting: Occupational Therapy

## 2015-06-03 ENCOUNTER — Inpatient Hospital Stay (HOSPITAL_COMMUNITY): Payer: Medicare HMO | Admitting: Physical Therapy

## 2015-06-03 NOTE — Progress Notes (Signed)
Subjective/Complaints: No new issues. Feeling well. Had a quiet weekend. No pain.   ROS denies CP, SOB, abd pain , N/V,   Objective: Vital Signs: Blood pressure 92/47, pulse 79, temperature 97.6 F (36.4 C), temperature source Oral, resp. rate 18, weight 79.2 kg (174 lb 9.7 oz), SpO2 98 %. No results found. No results found for this or any previous visit (from the past 72 hour(s)).    General: No acute distress. Vital signs reviewed Psych: Mood and affect are appropriate Heart: Regular rate and rhythm no rubs murmurs or extra sounds Lungs: Clear to auscultation, breathing unlabored, no rales or wheezes Abdomen: Positive bowel sounds, soft nontender to palpation, nondistended Skin: No evidence of breakdown, no evidence of rash Neurologic:  Motor strength is 5/5 in bilateral deltoid, bicep, tricep, grip, hip flexor, knee extensors, left  ankle dorsiflexor and plantar flexor 2/5 R ankle DF/PF. Language fluid. Aphasia appears much improved Musculoskeletal: No edema. No tenderness  Assessment/Plan: 1. Functional deficits secondary to Left MCA infarct with aphasia and gait d/o which require 3+ hours per day of interdisciplinary therapy in a comprehensive inpatient rehab setting. Physiatrist is providing close team supervision and 24 hour management of active medical problems listed below. Physiatrist and rehab team continue to assess barriers to discharge/monitor patient progress toward functional and medical goals. FIM: Function - Bathing Position: Shower Body parts bathed by patient: Buttocks, Front perineal area, Right arm, Left arm, Chest, Abdomen, Right upper leg, Left upper leg, Right lower leg, Left lower leg Body parts bathed by helper: Back, Left lower leg, Right lower leg Bathing not applicable: Right arm, Left arm, Chest, Abdomen, Right upper leg, Left upper leg, Right lower leg, Left lower leg, Back Assist Level: Touching or steadying assistance(Pt > 75%)  Function-  Upper Body Dressing/Undressing What is the patient wearing?: Pull over shirt/dress Pull over shirt/dress - Perfomed by patient: Thread/unthread right sleeve, Thread/unthread left sleeve, Put head through opening, Pull shirt over trunk Assist Level: Supervision or verbal cues Set up : To obtain clothing/put away Function - Lower Body Dressing/Undressing What is the patient wearing?: Underwear, Pants, Non-skid slipper socks Position: Sitting EOB Underwear - Performed by patient: Thread/unthread left underwear leg, Pull underwear up/down, Thread/unthread right underwear leg Underwear - Performed by helper: Thread/unthread right underwear leg Pants- Performed by patient: Thread/unthread right pants leg, Thread/unthread left pants leg, Pull pants up/down Pants- Performed by helper: Thread/unthread right pants leg, Thread/unthread left pants leg, Pull pants up/down Non-skid slipper socks- Performed by patient: Don/doff right sock, Don/doff left sock Non-skid slipper socks- Performed by helper: Don/doff right sock, Don/doff left sock Socks - Performed by patient: Don/doff right sock, Don/doff left sock Shoes - Performed by patient: Don/doff right shoe, Don/doff left shoe, Fasten right, Fasten left Assist for footwear: Dependant Assist for lower body dressing: Touching or steadying assistance (Pt > 75%)  Function - Toileting Toileting activity did not occur: No continent bowel/bladder event Toileting steps completed by patient: Adjust clothing prior to toileting, Adjust clothing after toileting Toileting steps completed by helper: Adjust clothing after toileting Assist level: Supervision or verbal cues  Function - Archivist transfer activity did not occur: Safety/medical concerns Toilet transfer assistive device: Grab bar Assist level to toilet: Supervision or verbal cues Assist level from toilet: Supervision or verbal cues  Function - Chair/bed transfer Chair/bed transfer  method: Stand pivot, Ambulatory Chair/bed transfer assist level: Supervision or verbal cues Chair/bed transfer assistive device: Walker, Orthosis Chair/bed transfer details: Verbal cues for precautions/safety, Verbal  cues for technique  Function - Locomotion: Wheelchair Will patient use wheelchair at discharge?: No Type: Manual Max wheelchair distance: 4825' Assist Level: Touching or steadying assistance (Pt > 75%) Assist Level: Touching or steadying assistance (Pt > 75%) Wheel 150 feet activity did not occur: Safety/medical concerns Turns around,maneuvers to table,bed, and toilet,negotiates 3% grade,maneuvers on rugs and over doorsills: No Function - Locomotion: Ambulation Assistive device: Walker-rolling Max distance: 140 Assist level: Supervision or verbal cues Assist level: Supervision or verbal cues Assist level: Supervision or verbal cues Walk 150 feet activity did not occur: Safety/medical concerns Assist level: Supervision or verbal cues Assist level: Touching or steadying assistance (Pt > 75%)  Function - Comprehension Comprehension: Auditory Comprehension assist level: Understands basic 75 - 89% of the time/ requires cueing 10 - 24% of the time  Function - Expression Expression: Verbal Expression assist level: Expresses basic 50 - 74% of the time/requires cueing 25 - 49% of the time. Needs to repeat parts of sentences.  Function - Social Interaction Social Interaction assist level: Interacts appropriately 75 - 89% of the time - Needs redirection for appropriate language or to initiate interaction.  Function - Problem Solving Problem solving assist level: Solves basic 50 - 74% of the time/requires cueing 25 - 49% of the time  Function - Memory Memory assist level: Recognizes or recalls 25 - 49% of the time/requires cueing 50 - 75% of the time Patient normally able to recall (first 3 days only): None of the above  Medical Problem List and Plan: 1.  Right  hemiparesis/aphasia  secondary to left MCA infarct secondary to left ICA /MCA occlusion status post revascularization with mechanical thrombectomy               - D/C date of 4/12 (pt says tomorrow?)   -AFO RLE 2.  DVT Prophylaxis/Anticoagulation: SCDs   3. Pain Management/history of multiple back surgeries: Tylenol as needed               -pain appears controlled at present 4. Dysphagia. Now on regular diet. Tolerating well 5. Neuropsych: This patient is  not capable of making decisions on his  own behalf 6. Skin/Wound Care: Routine skin checks   7. Fluids/Electrolytes/Nutrition: Routine I&O  wt stable at 82Kg 8. Atrial fibrillation/hypertropic cardiomyopathy/pacemaker. Follow-up per cardiology services. Heart rate controlled at present. amiodarone  decreased to 200 mg twice daily on 4/4 and then decrease to 200 mg daily on 4/18 9. Hypertension. Lisinopril 10 mg daily, decreased to 5 mg on 4/7, decreased to 2.5 on 4/9, metoprolol 12.5mg  BID . Monitor with increased mobility  -probably can dc lisinopril all together but likely keeping at low dose due to CM 10. Hyperlipidemia. Crestor 11. Pulmonary edema. Improved after IV lasix, now on po lasix,maintaining sats off O2 will d/c 12.  Aphasia- SLP eval 13.  Increased stooling but stools are formed   Incontinence appears to be intermittent  LOS (Days) 10 A FACE TO FACE EVALUATION WAS PERFORMED  SWARTZ,ZACHARY T 06/03/2015, 8:23 AM

## 2015-06-03 NOTE — Progress Notes (Signed)
Social Work  Discharge Note  The overall goal for the admission was met for:   Discharge location: [place]   Length of Stay: Yes-12 DAYS  Discharge activity level: Yes-SUPERVISION LEVEL  Home/community participation: Yes  Services provided included: MD, RD, PT, OT, SLP, RN, CM, TR, Pharmacy and SW  Financial Services: Private Insurance: [place]  Follow-up services arranged: Home Health: [facility]-PT,OT,SP,RN,AIDE, DME: [facility] and Patient/Family has no preference for HH/DME agencies  ADDED A ROLLING WALKER TO [facility] ORDER  Comments (or additional information):BOTH CHILDREN HERE ON Friday TO GO THROUGH EDUCATION WITH PT, BOTH FEEL CAN MANAGE HIM AT HOME AND AWARE OF THE INCONTINENCE ISSUES BETWEEN SON WHOM LIVES WITH HIM AND DAUGHTER THEY CAN PROVIDE 24 HR SUPERVISION  Patient/Family verbalized understanding of follow-up arrangements: Yes  Individual responsible for coordination of the follow-up plan: MELISSA-DAUGHTER & Kamali-SON  Confirmed correct DME delivered: Elease Hashimoto 06/03/2015    Elease Hashimoto

## 2015-06-03 NOTE — Progress Notes (Addendum)
Physical Therapy Session Note  Patient Details  Name: Luis Robinson MRN: [id number] Date of Birth: 22-Mar-1940  Today's Date: 06/03/2015  [id number] 70 min group treatment session  Therapy Documentation Precautions:  Precautions Precautions: Fall Precaution Comments: R foot drop  Restrictions Weight Bearing Restrictions: No  Short sit to and from supine in bed independent  Sit to and from stand transfer with RW supervision, verbal cues for hand placement and controlled descent.   Patient ambulated 250 feetx3 with RW and right AFO supervision. Patient required verbal cues for pacing, upright posture, environmental awareness and topographical orientation.   Patient up and down 12 steps with BUE support on right handrail close supervision. Patient educated on proper sequence and technique. Verbal cues for pacing and sequencing s well as hand placement on the rail.   Nu-step 10 minutes level 8  Patient performed side stepping to the left and right min to mod assist without assistive device 25 feet.  Patient negotiated 6 foot ramp with close supervision  and RW and ambulated 15 feet over uneven terrain with min assist for RW management.   Standing there ex: B heel raises 3x10 Mini squats 3x10  Patient returned to bed at end of session and care handed off directly to [facility].   See Function Navigator for Current Functional Status.   Therapy/Group: Individual Therapy  Merri RayWISHART,Luis Krist J 06/03/2015, 4:04 PM

## 2015-06-03 NOTE — Progress Notes (Signed)
Speech Language Pathology Daily Session Note  Patient Details  Name: Luis SoloKenneth Robinson MRN: [id number] Date of Birth: 1940/09/24  Today's Date: 06/03/2015 SLP Individual Time:  1030-1125   55 minutes  Short Term Goals: Week 2: SLP Short Term Goal 1 (Week 2): Pt demonstrate ability to answer simple Y/N questions for 75% accuracy with min A verbal cues . SLP Short Term Goal 2 (Week 2): Pt demonstrate reading comprehension at the phrase level with min A. SLP Short Term Goal 3 (Week 2): Pt demonstrate O x 4 with mod A. SLP Short Term Goal 4 (Week 2): Pt demonstrate ability to follow 2-step directives with supervision.  SLP Short Term Goal 5 (Week 2): Pt will complete phrase closure tasks at the word  level with min assist verbal cues for 75% accuracy.  SLP Short Term Goal 6 (Week 2): Pt will clear oral residue and utilize rate and portion control with presentations of regular textures with mod I.    Skilled Therapeutic Interventions: Skilled treatment session focused on addressing aphasia and dysphasia goals. SLP facilitated session by providing Mod verbal cues to achieve 10/10 accuracy with responsive naming task; 5/10 prior to cuing.  Patient also benefited from Min verbal cues to accurately match object to word from a field of 3.  Patient set-up and consumed regular textures and thin liquids via straw with cough x1 due to large mixed consistency bolus.  Patient able to demonstrate more appropriately sized bites and sips after without cues; likely due to increased awareness.  Patient directed SLP back to his room Mod I at end of session. Continue with current plan of care.    Function:  Eating Eating   Modified Consistency Diet: No Eating Assist Level: Swallowing techniques: self managed           Cognition Comprehension Comprehension assist level: Understands basic 75 - 89% of the time/ requires cueing 10 - 24% of the time  Expression   Expression assist level: Expresses basic 50 - 74%  of the time/requires cueing 25 - 49% of the time. Needs to repeat parts of sentences.  Social Interaction Social Interaction assist level: Interacts appropriately 75 - 89% of the time - Needs redirection for appropriate language or to initiate interaction.  Problem Solving Problem solving assist level: Solves basic 50 - 74% of the time/requires cueing 25 - 49% of the time  Memory Memory assist level: Recognizes or recalls 50 - 74% of the time/requires cueing 25 - 49% of the time    Pain  No  Therapy/Group: Individual Therapy  Charlane FerrettiMelissa Tandrea Kommer, M.A., CCC-SLP [phone]  Kaleisha Bhargava 06/03/2015, 4:40 PM

## 2015-06-03 NOTE — Progress Notes (Signed)
Occupational Therapy Session Note  Patient Details  Name: Luis SoloKenneth Robinson MRN: [id number] Date of Birth: December 23, 1940  Today's Date: 06/03/2015 OT Individual Time: [id number] OT Individual Time Calculation (min): 60 min   Short Term Goals: Week 1:  OT Short Term Goal 1 (Week 1): STGs=LTGs due to short ELOS Week 2:  OT Short Term Goal 1 (Week 2): Continue working on [employer] supervision level goals.    Skilled Therapeutic Interventions/Progress Updates:  Patient received supine in bed and with no complaints of pain. Pt refused bathing this morning, stating someone already assisted him with bathing and dressing. NT states no one assisted him from her knowledge. Pt engaged in bed mobility, sat EOB to don bilateral shoes with minimal assistance for heel placement in right shoe due to AFO. Pt then stood with RW and ambulated into BR. Pt stood at toilet to urinate. Pt then ambulated to sink for grooming tasks of washing hands and brushing teeth. From room, pt ambulated to therapy gym using RW. Pt required cues for posture, looking ahead, and RW safety during ambulation and sit to/from stands. Pt tends to want to push RW out in front of him or beside him when going to transfer, making transfers very unsafe. Discussed this with patient, however pt with poor carryover. Engaged pt in therapeutic exercise focusing on functional strength and endurance using NuStep machine on level 7 for 8 minutes; pt stated this was "somewhat hard". Pt then ambulated to therapy mat and engaged in fine motor activity using small beads and string. Pt able to thread 10 beads in 4 minutes, then another 15 beads in 11 minutes. Pt with minimal spillage during this activity. Pt ambulated back to room, sat in w/c and therapist propelled pt to nurses station with quick release belt donned. Set-up pt with word search puzzles and left him a nurses station.   Therapy Documentation Precautions:  Precautions Precautions: Fall Precaution  Comments: R foot drop  Restrictions Weight Bearing Restrictions: No  Vital Signs: Therapy Vitals BP: (!) 92/47 mmHg  See Function Navigator for Current Functional Status.  Therapy/Group: Individual Therapy  Edwin CapPatricia Estoria Geary , MS, OTR/L, CLT   06/03/2015, 10:04 AM

## 2015-06-04 ENCOUNTER — Inpatient Hospital Stay (HOSPITAL_COMMUNITY): Payer: Medicare HMO | Admitting: Speech Pathology

## 2015-06-04 ENCOUNTER — Inpatient Hospital Stay (HOSPITAL_COMMUNITY): Payer: Medicare HMO | Admitting: Physical Therapy

## 2015-06-04 ENCOUNTER — Inpatient Hospital Stay (HOSPITAL_COMMUNITY): Payer: Medicare HMO | Admitting: Occupational Therapy

## 2015-06-04 ENCOUNTER — Inpatient Hospital Stay (HOSPITAL_COMMUNITY): Payer: Medicare HMO

## 2015-06-04 MED ORDER — APIXABAN 5 MG PO TABS
5.0000 mg | ORAL_TABLET | Freq: Two times a day (BID) | ORAL | Status: DC
Start: 2015-06-04 — End: 2015-06-05
  Administered 2015-06-04 – 2015-06-05 (×3): 5 mg via ORAL
  Filled 2015-06-04 (×3): qty 1

## 2015-06-04 MED ORDER — APIXABAN 2.5 MG PO TABS: 2.5000 mg | ORAL_TABLET | Freq: Two times a day (BID) | ORAL | Status: DC

## 2015-06-04 NOTE — Progress Notes (Signed)
Speech Language Pathology Discharge Summary  Patient Details  Name: Luis Robinson MRN: [id number] Date of Birth: [birth year]  Today's Date: 06/04/2015 SLP Individual Time: 2119-4174 SLP Individual Time Calculation (min): 44 min   Skilled Therapeutic Interventions:  Pt was seen for skilled ST targeting communication goals.  Upon SLP's arrival, pt stated "I'm so glad to see you."  Pt followed 2 step commands to ambulate to ST treatment room with supervision verbal cues.  Pt was able to match object to word from a field of three for 70% accuracy, which improved to 90% accuracy with min assist and 100% accuracy with mod assist.  Pt initially required max assist to recognize and correct errors when completing phrase closure tasks from a choice of three written words; however, as task progressed, pt demonstrated improved awareness of verbal errors as evidenced by SLP being able to fade cues to supervision level assist for error awareness.  Pt left in room sitting at edge of bed with bed alarm set.     Patient has met 7 of 7 long term goals.  Patient to discharge at [facility] level.  Reasons goals not met:     Clinical Impression/Discharge Summary:  Pt made functional gains while inpatient and is discharging having met 7 out of 7 long term goals.  Pt is consuming regular textures and thin liquids with mod I use of swallowing precautions and minimal overt s/s of aspiration.  Pt currently presents with a mild-moderate global aphasia characterized by poor awareness of verbal errors (paraphasias, perseveration, echolalia). Pt is discharging home with 24/7 supervision and recommendations for follow up speech therapy at next level of care to continue to address cognitive-linguistic function.  Pt and family education is complete.     Care Partner:  Caregiver Able to Provide Assistance: Yes  Type of Caregiver Assistance: Physical;Cognitive  Recommendation:  Outpatient SLP;24 hour supervision/assistance   Rationale for SLP Follow Up: Reduce caregiver burden;Maximize functional communication;Maximize cognitive function and independence   Equipment: none recommended by SLP    Reasons for discharge: Discharged from hospital   Patient/Family Agrees with Progress Made and Goals Achieved: Yes   Function:  Eating Eating                 Cognition Comprehension Comprehension assist level: Understands basic 75 - 89% of the time/ requires cueing 10 - 24% of the time  Expression   Expression assist level: Expresses basic 50 - 74% of the time/requires cueing 25 - 49% of the time. Needs to repeat parts of sentences.  Social Interaction Social Interaction assist level: Interacts appropriately 75 - 89% of the time - Needs redirection for appropriate language or to initiate interaction.  Problem Solving Problem solving assist level: Solves basic 75 - 89% of the time/requires cueing 10 - 24% of the time  Memory Memory assist level: Recognizes or recalls 50 - 74% of the time/requires cueing 25 - 49% of the time   Emilio Math 06/04/2015, 3:31 PM

## 2015-06-04 NOTE — Progress Notes (Signed)
Social Work Patient ID: Luis Robinson, male   DOB: 02-19-1941, 75 y.o.   MRN: [id number] Met with pt, son and daughter who were here to visit pt. Agreeable to plan for tomorrow and feel ready for pt to come home. Discussed home health and equipment delivered to room. Son feels prepared for caring for Dad. Made aware not having a good day as yesterday, pt reports did not sleep well last night. Pt is ready to go home.

## 2015-06-04 NOTE — Progress Notes (Signed)
Occupational Therapy Note  Patient Details  Name: Thereasa SoloKenneth Barkdull MRN: [id number] Date of Birth: [birth year]  Today's Date: 06/04/2015 OT Individual Time: 1000-1030 OT Individual Time Calculation (min): 30 min   No reports of pain in session  1:1 participated in grooming at the sink in standing with distant supervision. Pt reported he puts on pre-shave lotion before shaving however pt donned it after shaving. Pt participating in packing his items for d/c tomorrow and picking out today's clothing with questioning cues. Pt pushed w/c (ambulating) to gym to put in dirty location supervision. Pt ambulated around room with distant supervision. Pt's expressive aphasia more noted when story telling about his time he served in the [employer] and pt required mod to max A self correction or awareness of errors. Engaged in game of [place] and could correctly add cards mod I but required mod A for expression at times.   Roney MansSmith, Amia Rynders [facility] 06/04/2015, 10:34 AM

## 2015-06-04 NOTE — Progress Notes (Signed)
Physical Therapy Discharge Summary  Patient Details  Name: Luis Robinson MRN: [id number] Date of Birth: 1940-12-14  Today's Date: 06/04/2015 PT Individual Time: 0900-1000 PT Individual Time Calculation (min): 60 min    Patient has met 9 of 9 long term goals due to improved activity tolerance, improved balance, improved postural control, increased strength, ability to compensate for deficits, functional use of  right upper extremity and right lower extremity and improved coordination.  Patient to discharge at an ambulatory level Supervision.   Patient's care partner is independent to provide the necessary physical and cognitive assistance at discharge.  Reasons goals not met: NA  Recommendation:  Patient will benefit from ongoing skilled PT services in home health setting to continue to advance safe functional mobility, address ongoing impairments in RLE weakness, standing balance, activity tolerance, and minimize fall risk.  Equipment: RW  Reasons for discharge: treatment goals met and discharge from hospital  Patient/family agrees with progress made and goals achieved: Yes  PT Discharge Precautions/Restrictions Precautions Precautions: Fall Restrictions Weight Bearing Restrictions: No Pain Pain Assessment Pain Assessment: No/denies pain Vision/Perception   No changes from baseline  Cognition Overall Cognitive Status: Impaired/Different from baseline Arousal/Alertness: Awake/alert Orientation Level: Oriented X4 Attention: Selective Focused Attention: Appears intact Sustained Attention: Appears intact Selective Attention: Impaired Selective Attention Impairment: Functional basic Memory: Impaired Memory Impairment: Storage deficit;Retrieval deficit Awareness: Impaired Awareness Impairment: Intellectual impairment Problem Solving: Impaired Problem Solving Impairment: Verbal basic Executive Function:  (all impaired due to lower level deficits ) Behaviors:  Impulsive Safety/Judgment: Impaired Sensation Sensation Light Touch: Appears Intact Hot/Cold: Appears Intact Proprioception: Appears Intact Coordination Gross Motor Movements are Fluid and Coordinated: No Fine Motor Movements are Fluid and Coordinated: Yes Coordination and Movement Description: Compensatory gait pattern without AD due to RLE weakness Heel Shin Test: [facility] Motor  Motor Motor: Hemiplegia  Mobility Bed Mobility Bed Mobility: Rolling Right;Rolling Left;Sit to Supine;Supine to Sit Rolling Right: 6: Modified independent (Device/Increase time) Rolling Left: 6: Modified independent (Device/Increase time) Supine to Sit: 6: Modified independent (Device/Increase time);HOB flat Sit to Supine: 6: Modified independent (Device/Increase time);HOB flat Transfers Transfers: Yes Sit to Stand: 6: Modified independent (Device/Increase time);With upper extremity assist Stand to Sit: 6: Modified independent (Device/Increase time);With upper extremity assist Stand Pivot Transfers: 6: Modified independent (Device/Increase time);With armrests  Floor Transfer using mat table for UE support = min A Locomotion  Ambulation Ambulation: Yes Ambulation/Gait Assistance: 5: Supervision Ambulation Distance (Feet): 200 Feet Assistive device: None;Other (Comment) (R AFO) Gait Gait: Yes Gait Pattern: Impaired Gait Pattern: Step-through pattern;Decreased stride length;Decreased dorsiflexion - right;Decreased hip/knee flexion - right;Decreased weight shift to right;Lateral trunk lean to left;Lateral trunk lean to right;Decreased trunk rotation;Wide base of support Gait velocity: 10 MWT = 0.625 m/s Stairs / Additional Locomotion Stairs: Yes Stairs Assistance: 5: Supervision Stair Management Technique: One rail Right;Step to pattern;Forwards Number of Stairs: 12 Height of Stairs: 6 Ramp: 5: Supervision Wheelchair Mobility Wheelchair Mobility: No  Trunk/Postural Assessment  Cervical  Assessment Cervical Assessment: Exceptions to [facility] (forward head) Thoracic Assessment Thoracic Assessment: Within Functional Limits Lumbar Assessment Lumbar Assessment: Exceptions to WFL (decreased mobility, hx of back surgery) Postural Control Postural Control: Deficits on evaluation Righting Reactions: delayed  Balance Balance Balance Assessed: Yes Standardized Balance Assessment Standardized Balance Assessment: Berg Balance Test Berg Balance Test Sit to Stand: Able to stand without using hands and stabilize independently Standing Unsupported: Able to stand safely 2 minutes Sitting with Back Unsupported but Feet Supported on Floor or Stool: Able to sit safely and securely  2 minutes Stand to Sit: Sits safely with minimal use of hands Transfers: Able to transfer safely, definite need of hands Standing Unsupported with Eyes Closed: Able to stand 10 seconds with supervision Standing Ubsupported with Feet Together: Able to place feet together independently and stand for 1 minute with supervision From Standing, Reach Forward with Outstretched Arm: Can reach forward >12 cm safely (5") From Standing Position, Pick up Object from Floor: Able to pick up shoe safely and easily From Standing Position, Turn to Look Behind Over each Shoulder: Turn sideways only but maintains balance Turn 360 Degrees: Able to turn 360 degrees safely but slowly Standing Unsupported, Alternately Place Feet on Step/Stool: Able to complete >2 steps/needs minimal assist Standing Unsupported, One Foot in Front: Able to plae foot ahead of the other independently and hold 30 seconds Standing on One Leg: Able to lift leg independently and hold equal to or more than 3 seconds Total Score: 42/56 Static Standing Balance Static Standing - Balance Support: During functional activity;No upper extremity supported Static Standing - Level of Assistance: 6: Modified independent (Device/Increase time) Dynamic Standing  Balance Dynamic Standing - Balance Support: During functional activity;No upper extremity supported Dynamic Standing - Level of Assistance: 5: Stand by assistance  Five times Sit to Stand Test (FTSS) Method: Use a straight back chair with a solid seat that is 16-18" high. Ask participant to sit on the chair with arms folded across their chest.   Instructions: "Stand up and sit down as quickly as possible 5 times, keeping your arms folded across your chest."   Measurement: Stop timing when the participant stands the 5th time.  TIME: __16____ (in seconds)  Times > 13.6 seconds is associated with increased disability and morbidity (Guralnik, 2000) Times > 15 seconds is predictive of recurrent falls in healthy individuals aged 7 and older (Buatois, et al., 2008) Normal performance values in community dwelling individuals aged 44 and older (Bohannon, 2006): o 60-69 years: 11.4 seconds o 70-79 years: 12.6 seconds o 80-89 years: 14.8 seconds  MCID: ? 2.3 seconds for Vestibular Disorders (Meretta, 2006)  Extremity Assessment  RUE Assessment RUE Assessment: Within Functional Limits (slight weakness in RUE) LUE Assessment LUE Assessment: Within Functional Limits RLE Assessment RLE Assessment: Exceptions to [facility] RLE Strength RLE Overall Strength: Deficits RLE Overall Strength Comments: 5/5 hip flexion, knee flexion/extension, and ankle PF except 2+/5 ankle DF LLE Assessment LLE Assessment: Within Functional Limits (5/5 throughout)   See Function Navigator for Current Functional Status.  Carney Living A 06/04/2015, 9:42 AM

## 2015-06-04 NOTE — Discharge Summary (Signed)
Discharge summary job [id number]

## 2015-06-04 NOTE — Discharge Summary (Signed)
[name]:  Robinson, Luis             ACCOUNT NO.:  [id number]  MEDICAL RECORD NO.:  [id number]  LOCATION:  [id number]                        FACILITY:  [facility]  PHYSICIAN:  Erick ColaceAndrew E. Kirsteins, M.D.DATE OF BIRTH:  1940/12/16  DATE OF ADMISSION:  05/23/2015 DATE OF DISCHARGE:  06/05/2015                              DISCHARGE SUMMARY   DISCHARGE DIAGNOSES: 1. 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 2. Pain management. 3. Dysphagia, resolved. 4. Atrial fibrillation with hypertrophic cardiomyopathy and pacemaker. 5. Hypertension. 6. Hyperlipidemia. 7. Aphasia.  HISTORY OF PRESENT ILLNESS:  This is a [age] year old right-handed male, history of hypertrophic cardiomyopathy, PAF with ablation, pacemaker maintained on Eliquis, [facility], tobacco abuse.  Admitted on May 20, 2015, after a fall with acute onset of right-sided weakness, inability to speak.  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 Mild mass effect upon the left lateral ventricle.  Found to have atrial fibrillation with RVR and intravenous tPA not given as patient had taken his dose of Eliquis already.  Underwent cerebral angiogram, complete revascularization of occluded left MCA and single pass Solitaire and tPA, incomplete revascularization of left ICA occlusion with tPA stenting assisted angioplasty.  Tolerated extubation postop procedure, had improvement in right-sided weakness.  The patient with resultant inattention right facial droop kyphosis perseveration.  Neurology consulted, with followup, maintained on aspirin and Plavix for CVA. Plan was to repeat CT of the head in 1 week, if hemorrhage resolved resume Eliquis and Plavix and stop aspirin at that time.  Cardiology Service was consulted in regard to history  of atrial fibrillation, maintained on amiodarone and advised taper down to 200 mg daily over the next couple of weeks.  Maintained on a dysphagia #2 thin liquid diet. The patient was admitted for comprehensive rehab program.  PAST MEDICAL HISTORY:  See discharge diagnoses.  SOCIAL HISTORY:  Lives with son independent prior to admission. Functional status upon admission to Rehab Services was moderate assist, 120 feet rolling walker; moderate assist, stand pivot transfers; min to mod assist, activities of daily living.  PHYSICAL EXAMINATION:  VITAL SIGNS:  Blood pressure 130/86, pulse 70, temperature 97, respirations 18. GENERAL:  This was an alert male, expressive receptive aphasia. LUNGS:  Clear to auscultation.  No wheeze. CARDIAC:  Regular rate and rhythm.  No murmur. ABDOMEN:  Soft, nontender.  Good bowel sounds. EXTREMITIES:  Right upper extremity resting tremor.  REHABILITATION HOSPITAL COURSE:  The patient was admitted to inpatient rehab services with therapies initiated on a 3-hour daily basis, consisting of physical therapy, occupational therapy, speech therapy, and rehabilitation nursing.  The following issues were addressed during patient's rehabilitation stay.  Pertaining to [name]'s left MCA infarct with ICA MCA occlusion, hemorrhagic transformation, currently maintained on aspirin and Plavix therapy.  Plan was to resume Eliquis after followup cranial CT scan shows resolution of hemorrhage.  Latest cranial CT scan on June 03, 2015, shows decreasing conspicuity of the left MCA infarct previously seen.  No areas of infarct or hemorrhage and Eliquis resumed 06/04/2015  and aspirin discontinued with advisement to continue Plavix. Pain management with use of Tylenol.  Diet was advanced to regular consistency.  The patient with history of atrial fibrillation, cardiomyopathy, pacemaker, followup assistance of Cardiology Services. Amiodarone currently at 200 mg twice daily  and plan was to decrease to 200 mg daily by June 11, 2015.  Blood pressures remained monitored with lisinopril decreased and later discontinued due to some orthostasis.   No chest pain or shortness of breath.  The patient was able to communicate his need with noted receptive expressive aphasia.  The patient received weekly collaborative interdisciplinary team conferences to discuss estimated length of stay, family teaching, and any barriers to discharge.  Ambulating 250 feet x3 rolling walker, right AFO brace and supervision.  Navigating stairs.  Bilateral upper extremity support, right hand rail, close supervision.  Performed sidestepping, min to mod assist with assistive device.  Negotiated a ramp with rolling walker and supervision.  Gather belongings for activities of daily living and homemaking.  Ambulate rolling walker to the bathroom, stood at the toilet to urinate.  Ambulated to the sink for grooming task, washing hands, brushing teeth.  Follow up speech therapy for aphasia, facilitated sessions by providing moderate verbal cues to achieve 10/10 accuracy with responsive naming tasks.  Full family teaching was completed and plan discharge to home.  DISCHARGE MEDICATIONS:  Included: 1. Amiodarone 200 mg p.o. b.i.d., decreased to 200 mg daily on June 11, 2015. 2. Plavix 75 mg p.o. daily. 3. Lasix 20 mg p.o. daily. 4. Lopressor 12.5 mg p.o. b.i.d. 5. Protonix 40 mg p.o. daily. 6. Crestor 20 mg p.o. daily. 7.Eliquis 5 mg twice a day  DIET:  Regular.  FOLLOWUP:  The patient would follow up with E[facility] Col[facility]; Dr. Roda Shutters, Neurology Service, call for appointment; Veverly Fells. Anne Fu, MD, Cardiology Services as needed.  The patient would follow up with Dr. Dulce Sellar, [facility]     Mariam Dollar, P.A.   ______________________________ Erick Colace, M.D.    DA/MEDQ  D:  06/04/2015  T:  06/04/2015  Job:  [id number]  cc:    Dr. Lilian Coma, MD Jake Bathe, MD

## 2015-06-04 NOTE — Progress Notes (Signed)
Occupational Therapy Session Note  Patient Details  Name: Luis Robinson MRN: [id number] Date of Birth: 09-May-1940  Today's Date: 06/04/2015 OT Individual Time: [id number] OT Individual Time Calculation (min): 73 min    Short Term Goals: Week 1:  OT Short Term Goal 1 (Week 1): STGs=LTGs due to short ELOS Week 2:  OT Short Term Goal 1 (Week 2): Continue working on [employer] supervision level goals.    Skilled Therapeutic Interventions/Progress Updates:    Pt resting in bed upon arrival.  Pt engaged in BADL retraining including toileting, bathing at shower level, and dressing with sit<>stand from EOB.  Pt required min verbal cues for safety when donning/doffing pants. Pt amb in room without AD to and from bathroom for toileting and bathing tasks.  Pt was able to don his AFO without assistance.  Focus on activity tolerance, sit<>stand, standing balance, functional amb without AD, and safety awareness.  Therapy Documentation Precautions:  Precautions Precautions: Fall Precaution Comments: R foot drop  Restrictions Weight Bearing Restrictions: No  Pain: Pain Assessment Pain Assessment: No/denies pain  See Function Navigator for Current Functional Status.   Therapy/Group: Individual Therapy  Rich BraveLanier, Luis Robinson 06/04/2015, 12:01 PM

## 2015-06-04 NOTE — Progress Notes (Signed)
Occupational Therapy Discharge Summary  Patient Details  Name: Luis Robinson MRN: [id number] Date of Birth: 16-Aug-1940   Patient has met 8 of 8 long term goals due to improved activity tolerance, improved balance, postural control, ability to compensate for deficits, improved attention, improved awareness and improved coordination.  Pt made steady progress with BADLs during this admission and is supervision for all tasks including transfers.  Pt requires max verbal cues for safety awareness.  Pt's son and daughter have been present for therapy session and have verbalized understanding of recommendation for 24 hour supervision. Patient to discharge at overall Supervision level.  Patient's care partner is independent to provide the necessary physical and cognitive assistance at discharge.      Recommendation:  Patient will benefit from ongoing skilled OT services in home health setting to continue to advance functional skills in the area of BADL and Reduce care partner burden.  Equipment: BSC  Reasons for discharge: treatment goals met and discharge from hospital  Patient/family agrees with progress made and goals achieved: Yes  OT Discharge Vision/Perception  Vision- History Baseline Vision/History: Wears glasses Wears Glasses: At all times Patient Visual Report: No change from baseline Vision- Assessment Vision Assessment?: No apparent visual deficits Eye Alignment: Within Functional Limits Ocular Range of Motion: Within Functional Limits Alignment/Gaze Preference: Within Defined Limits Tracking/Visual Pursuits: Able to track stimulus in all quads without difficulty  Cognition Overall Cognitive Status: Impaired/Different from baseline Arousal/Alertness: Awake/alert Orientation Level: Oriented X4 Attention: Selective Focused Attention: Appears intact Sustained Attention: Appears intact Selective Attention: Impaired Selective Attention Impairment: Functional basic Memory:  Impaired Awareness: Impaired Awareness Impairment: Intellectual impairment Problem Solving: Impaired Behaviors: Impulsive Safety/Judgment: Impaired Sensation Sensation Light Touch: Appears Intact Stereognosis: Not tested Hot/Cold: Appears Intact Proprioception: Appears Intact Motor  Motor Motor: Hemiplegia Trunk/Postural Assessment  Cervical Assessment Cervical Assessment: Exceptions to [facility] Thoracic Assessment Thoracic Assessment: Within Functional Limits Lumbar Assessment Lumbar Assessment: Exceptions to [facility] Postural Control Postural Control: Deficits on evaluation Righting Reactions: delayed  Balance Dynamic Sitting Balance Dynamic Sitting - Balance Support: During functional activity Dynamic Sitting - Level of Assistance: 6: Modified independent (Device/Increase time) Extremity/Trunk Assessment RUE Assessment RUE Assessment: Within Functional Limits (slight weakness in RUE) LUE Assessment LUE Assessment: Within Functional Limits   See Function Navigator for Current Functional Status.  Leotis Shames [facility] 06/04/2015, 3:13 PM

## 2015-06-04 NOTE — Progress Notes (Signed)
Subjective/Complaints: Feeling well. Good night sleep.   ROS denies CP, SOB, abd pain , N/V,   Objective: Vital Signs: Blood pressure 97/43, pulse 64, temperature 97.6 F (36.4 C), temperature source Oral, resp. rate 18, weight 80.1 kg (176 lb 9.4 oz), SpO2 99 %. Ct Head Wo Contrast  06/03/2015  CLINICAL DATA:  Follow-up ischemic left MCA stroke. EXAM: CT HEAD WITHOUT CONTRAST TECHNIQUE: Contiguous axial images were obtained from the base of the skull through the vertex without intravenous contrast. COMPARISON:  05/28/2015 FINDINGS: Previously seen left MCA infarct is not as well visualized as on prior study. No new areas of hemorrhage or infarction. Decreasing low-density in the region of the left frontal lobe and insular cortex. Mild chronic small vessel disease throughout the deep white matter. No hydrocephalus or mass effect. No midline shift. No acute calvarial abnormality. IMPRESSION: Decreasing conspicuity of the left MCA infarct previously seen. No new areas of infarct or hemorrhage. Electronically Signed   By: Charlett Nose M.D.   On: 06/03/2015 10:25   No results found for this or any previous visit (from the past 72 hour(s)).    General: No acute distress. Vital signs reviewed Psych: Mood and affect are appropriate Heart: Regular rate and rhythm no rubs murmurs or extra sounds Lungs: Clear to auscultation, breathing unlabored, no rales or wheezes Abdomen: Positive bowel sounds, soft nontender to palpation, nondistended Skin: No evidence of breakdown, no evidence of rash Neurologic:  Motor strength is 5/5 in bilateral deltoid, bicep, tricep, grip, hip flexor, knee extensors, left  ankle dorsiflexor and plantar flexor 2/5 R ankle DF/PF. Language fluid. Struggles with naming. Better with spontaneous speech Musculoskeletal: No edema. No tenderness  Assessment/Plan: 1. Functional deficits secondary to Left MCA infarct with aphasia and gait d/o which require 3+ hours per day of  interdisciplinary therapy in a comprehensive inpatient rehab setting. Physiatrist is providing close team supervision and 24 hour management of active medical problems listed below. Physiatrist and rehab team continue to assess barriers to discharge/monitor patient progress toward functional and medical goals. FIM: Function - Bathing Bathing activity did not occur: Refused Position: Sport and exercise psychologist parts bathed by patient: Buttocks, Front perineal area, Right arm, Left arm, Chest, Abdomen, Right upper leg, Left upper leg, Right lower leg, Left lower leg Body parts bathed by helper: Back, Left lower leg, Right lower leg Bathing not applicable: Right arm, Left arm, Chest, Abdomen, Right upper leg, Left upper leg, Right lower leg, Left lower leg, Back Assist Level: Touching or steadying assistance(Pt > 75%)  Function- Upper Body Dressing/Undressing Upper body dressing/undressing activity did not occur: Refused What is the patient wearing?: Pull over shirt/dress Pull over shirt/dress - Perfomed by patient: Thread/unthread right sleeve, Thread/unthread left sleeve, Put head through opening, Pull shirt over trunk Assist Level: Supervision or verbal cues Set up : To obtain clothing/put away Function - Lower Body Dressing/Undressing Lower body dressing/undressing activity did not occur: Refused What is the patient wearing?: Underwear, Pants, Non-skid slipper socks Position: Sitting EOB Underwear - Performed by patient: Thread/unthread left underwear leg, Pull underwear up/down, Thread/unthread right underwear leg Underwear - Performed by helper: Thread/unthread right underwear leg Pants- Performed by patient: Thread/unthread right pants leg, Thread/unthread left pants leg, Pull pants up/down Pants- Performed by helper: Thread/unthread right pants leg, Thread/unthread left pants leg, Pull pants up/down Non-skid slipper socks- Performed by patient: Don/doff right sock, Don/doff left sock Non-skid  slipper socks- Performed by helper: Don/doff right sock, Don/doff left sock Socks - Performed by patient:  Don/doff right sock, Don/doff left sock Shoes - Performed by patient: Don/doff right shoe, Don/doff left shoe, Fasten right, Fasten left Assist for footwear: Dependant Assist for lower body dressing: Touching or steadying assistance (Pt > 75%)  Function - Toileting Toileting activity did not occur: No continent bowel/bladder event Toileting steps completed by patient: Adjust clothing prior to toileting, Performs perineal hygiene, Adjust clothing after toileting Toileting steps completed by helper: Adjust clothing after toileting Assist level: Supervision or verbal cues  Function - Toilet Transfers Toilet transfer activity did not occur: Safety/medical concerns Toilet transfer assistive device: Grab bar Assist level to toilet: Supervision or verbal cues (standing at toilet) Assist level from toilet: Supervision or verbal cues  Function - Chair/bed transfer Chair/bed transfer method: Stand pivot, Ambulatory Chair/bed transfer assist level: Supervision or verbal cues Chair/bed transfer assistive device: Walker, Orthosis Chair/bed transfer details: Verbal cues for precautions/safety, Verbal cues for technique  Function - Locomotion: Wheelchair Will patient use wheelchair at discharge?: No Type: Manual Max wheelchair distance: 25' Assist Level: Touching or steadying assistance (Pt > 75%) Assist Level: Touching or steadying assistance (Pt > 75%) Wheel 150 feet activity did not occur: Safety/medical concerns Turns around,maneuvers to table,bed, and toilet,negotiates 3% grade,maneuvers on rugs and over doorsills: No Function - Locomotion: Ambulation Assistive device: Walker-rolling Max distance: 140 Assist level: Supervision or verbal cues Assist level: Supervision or verbal cues Assist level: Supervision or verbal cues Walk 150 feet activity did not occur: Safety/medical  concerns Assist level: Supervision or verbal cues Assist level: Touching or steadying assistance (Pt > 75%)  Function - Comprehension Comprehension: Auditory Comprehension assist level: Understands basic 75 - 89% of the time/ requires cueing 10 - 24% of the time  Function - Expression Expression: Verbal Expression assist level: Expresses basic 50 - 74% of the time/requires cueing 25 - 49% of the time. Needs to repeat parts of sentences.  Function - Social Interaction Social Interaction assist level: Interacts appropriately 75 - 89% of the time - Needs redirection for appropriate language or to initiate interaction.  Function - Problem Solving Problem solving assist level: Solves basic 50 - 74% of the time/requires cueing 25 - 49% of the time  Function - Memory Memory assist level: Recognizes or recalls 50 - 74% of the time/requires cueing 25 - 49% of the time Patient normally able to recall (first 3 days only): None of the above  Medical Problem List and Plan: 1.  Right hemiparesis/aphasia  secondary to left MCA infarct secondary to left ICA /MCA occlusion status post revascularization with mechanical thrombectomy               - D/C date of 4/12 ---finalize planning  -AFO RLE 2.  DVT Prophylaxis/Anticoagulation: SCDs   3. Pain Management/history of multiple back surgeries: Tylenol as needed               -pain appears controlled at present 4. Dysphagia. Now on regular diet. Tolerating well 5. Neuropsych: This patient is  not capable of making decisions on his  own behalf 6. Skin/Wound Care: Routine skin checks   7. Fluids/Electrolytes/Nutrition: Routine I&O  wt stable at 82Kg 8. Atrial fibrillation/hypertropic cardiomyopathy/pacemaker. Follow-up per cardiology services. Heart rate controlled at present. amiodarone  decreased to 200 mg twice daily on 4/4 and then decrease to 200 mg daily on 4/18 9. Hypertension. Lisinopril 10 mg daily, decreased to 5 mg on 4/7, decreased to 2.5 on  4/9, metoprolol 12.5mg  BID . Monitor with increased mobility  -will stop  lisinopril---can follow up with primary after discharge regarding resumption 10. Hyperlipidemia. Crestor 11. Pulmonary edema. Improved after IV lasix, now on po lasix,maintaining sats off O2 will d/c 12.  Aphasia- SLP eval 13.  Increased stooling but stools are formed   Incontinence appears to be intermittent  LOS (Days) 11 A FACE TO FACE EVALUATION WAS PERFORMED  SWARTZ,ZACHARY T 06/04/2015, 8:28 AM

## 2015-06-04 NOTE — Patient Care Conference (Signed)
Inpatient RehabilitationTeam Conference and Plan of Care Update Date: 06/04/2015   Time: 3:20 PM    Patient Name: Luis Robinson      Medical Record Number: [id number]  Date of Birth: July 30, 1940 Sex: Male         Room/Bed: 4M01C/4M01C-01 Payor Info: Payor: [employer] MEDICARE / Plan: [employer] MEDICARE HMO/PPO / Product Type: *No Product type* /    Admitting Diagnosis: CVA  Admit Date/Time:  05/24/2015  7:58 PM Admission Comments: No comment available   Primary Diagnosis:  Left middle cerebral artery stroke [facility]) Principal Problem: Left middle cerebral artery stroke [facility])  Patient Active Problem List   Diagnosis Date Noted  . Labile blood pressure   . Bowel incontinence   . CVA (cerebral infarction)   . Aphasia due to stroke 05/25/2015  . Right hemiparesis ([place]) 05/25/2015  . Left middle cerebral artery stroke ([place]) 05/24/2015  . SSS (sick sinus syndrome) ([place]) 05/22/2015  . Cardiac pacemaker in situ-MDT- implanted 2008 05/22/2015  . Chronic anticoagulation-Eliquis prior to adm 05/22/2015  . Atrial fibrillation with RVR ([place]) 05/22/2015  . CAD- S/P Dx1 DES Aug 2016 05/22/2015  . Dyslipidemia 05/22/2015  . Prediabetes   . Tobacco abuse   . Paroxysmal atrial fibrillation (H[facility])   . Benign essential HTN   . History of back surgery   . Acute blood loss anemia   . Hypertrophic obstructive cardiomyopathy ([place])   . Tachypnea   . Leukocytosis   . Thrombocytopenia ([place])   . CVA due to occlusion of cerebral artery- treated with LMCA stent 05/20/15   . History of ETT   . Respiratory failure with hypoxia ([place])   . Stroke ([place])   . Stroke (cerebrum) ([place]) 05/20/2015    Expected Discharge Date: Expected Discharge Date: 06/05/15  Team Members Present: Physician leading conference: Dr. Alger Simons Social Worker Present: Ovidio Kin, LCSW Nurse Present: Dorien Chihuahua, RN PT Present: Georjean Mode, PT OT Present: Clyda Greener, OT SLP Present: Windell Moulding, SLP PPS Coordinator present :  Daiva Nakayama, RN, CRRN     Current Status/Progress Goal Weekly Team Focus  Medical   improved behavior. language gradually improving also.   finalize medications for discharge  prepare for dc tomorrow   Bowel/Bladder   incont x2, LBM 4/9. Timed toileting during the day   To be continent bladder and bowel  Cont timed toileting and Monitor bladder and bowel function   Swallow/Nutrition/ Hydration   Regular, textures; straws okay   mod I   goals met   ADL's     at goal level-education completed with family   mod/i-supervision Min with lower body bathing   family education  Mobility   At goal level and ready to D/C  mod I bed mobility/transfers, supervision car transfers, gait, stairs, w/c mobility  D/C planning   Communication   mild-moderate expressive>receptive aphasia, continues with poor awareness of deficits  na mod assist   family education and grad day complete, goals met    Safety/Cognition/ Behavioral Observations  improving awareness of verbal errors, poor safety awareness   supervision   family education and grad day complete, discharge tomorrow    Pain   No c/o pain  <3   Assess and treat pain every shift   Skin   Intact CDI  No new infection/breakdown   Assess skin every shift      *See Care Plan and progress notes for long and short-term goals.  Barriers to Discharge: see prior    Possible Resolutions  to Barriers:  see prior, supervision at home    Discharge Planning/Teaching Needs:    Home with son and daughter assisting mostly son whom lives with him and is disabled. Family education completed Friday 3/31     Team Discussion:  Reached his goals and family education completed. Aware he will need 24 hr care and issues with his continence. Medically stable and ready for Dc tomorrow.  Revisions to Treatment Plan:  DC tomorrow   Continued Need for Acute Rehabilitation Level of Care: The patient requires daily medical management by a physician with specialized  training in physical medicine and rehabilitation for the following conditions: Daily direction of a multidisciplinary physical rehabilitation program to ensure safe treatment while eliciting the highest outcome that is of practical value to the patient.: Yes Daily medical management of patient stability for increased activity during participation in an intensive rehabilitation regime.: Yes Daily analysis of laboratory values and/or radiology reports with any subsequent need for medication adjustment of medical intervention for : Neurological problems;Blood pressure problems  Mackenzey Crownover, Gardiner Rhyme 06/04/2015, 3:40 PM

## 2015-06-04 NOTE — Discharge Instructions (Signed)
Inpatient Rehab Discharge Instructions  Luis Robinson Discharge date and time: No discharge date for patient encounter.   Activities/Precautions/ Functional Status: Activity: activity as tolerated Diet: regular diet Wound Care: none needed Functional status:  ___ No restrictions     ___ Walk up steps independently ___ 24/7 supervision/assistance   ___ Walk up steps with assistance ___ Intermittent supervision/assistance  ___ Bathe/dress independently ___ Walk with walker     _x__ Bathe/dress with assistance ___ Walk Independently    ___ Shower independently ___ Walk with assistance    ___ Shower with assistance ___ No alcohol     ___ Return to work/school ________  Special Instructions: Follow-up with Dr. Dulce Sellar at [facility]   COMMUNITY REFERRALS UPON DISCHARGE:    Home Health:   PT,OT,SP,RN, CNA  Agency:[facility] Phone:[phone]   Date of last service:06/05/2015  Medical Equipment/Items Ordered:BEDSIDE COMMODE & Levan Hurst  Agency/Supplier:[facility]   [phone]   GENERAL COMMUNITY RESOURCES FOR PATIENT/FAMILY: Support Groups:[facility] SUPPORT GROUP SECOND Tuesday [facility] MONTH @ [facility] HOSPITAL QUESTIONS CALL [phone]  My questions have been answered and I understand these instructions. I will adhere to these goals and the provided educational materials after my discharge from the hospital.  Patient/Caregiver Signature _______________________________ Date __________  Clinician Signature _______________________________________ Date __________  Please bring this form and your medication list with you to all your follow-up doctor's appointments.  STROKE/TIA DISCHARGE INSTRUCTIONS SMOKING Cigarette smoking nearly doubles your risk of having a stroke & is the single most alterable risk factor  If you smoke or have smoked in the last 12 months, you are advised to quit smoking for your health.  Most of the excess cardiovascular risk related to  smoking disappears within a year of stopping.  Ask you doctor about anti-smoking medications  [place] Quit Line: 1-800-QUIT NOW  Free Smoking Cessation Classes [phone]  CHOLESTEROL Know your levels; limit fat & cholesterol in your diet  Lipid Panel     Component Value Date/Time   CHOL 84 05/21/2015 0522   TRIG 59 05/21/2015 0522   HDL 29* 05/21/2015 0522   CHOLHDL 2.9 05/21/2015 0522   VLDL 12 05/21/2015 0522   LDLCALC 43 05/21/2015 0522      Many patients benefit from treatment even if their cholesterol is at goal.  Goal: Total Cholesterol (CHOL) less than 160  Goal:  Triglycerides (TRIG) less than 150  Goal:  HDL greater than 40  Goal:  LDL (LDLCALC) less than 100   BLOOD PRESSURE American Stroke Association blood pressure target is less that 120/80 mm/Hg  Your discharge blood pressure is:  BP: (!) 92/47 mmHg  Monitor your blood pressure  Limit your salt and alcohol intake  Many individuals will require more than one medication for high blood pressure  DIABETES (A1c is a blood sugar average for last 3 months) Goal HGBA1c is under 7% (HBGA1c is blood sugar average for last 3 months)  Diabetes: No known diagnosis of diabetes    Lab Results  Component Value Date   HGBA1C 5.8* 05/21/2015     Your HGBA1c can be lowered with medications, healthy diet, and exercise.  Check your blood sugar as directed by your physician  Call your physician if you experience unexplained or low blood sugars.  PHYSICAL ACTIVITY/REHABILITATION Goal is 30 minutes at least 4 days per week  Activity: Increase activity slowly, Therapies: Physical Therapy: Home Health Return to work:   Activity decreases your risk of heart attack and stroke and makes  your heart stronger.  It helps control your weight and blood pressure; helps you relax and can improve your mood.  Participate in a regular exercise program.  Talk with your doctor about the best form of exercise for you (dancing, walking,  swimming, cycling).  DIET/WEIGHT Goal is to maintain a healthy weight  Your discharge diet is: Diet regular Room service appropriate?: Yes; Fluid consistency:: Thin  liquids Your height is:    Your current weight is: Weight: 79.2 kg (174 lb 9.7 oz) Your Body Mass Index (BMI) is:     Following the type of diet specifically designed for you will help prevent another stroke.  Your goal weight range is:    Your goal Body Mass Index (BMI) is 19-24.  Healthy food habits can help reduce 3 risk factors for stroke:  High cholesterol, hypertension, and excess weight.  RESOURCES Stroke/Support Group:  Call [phone]   STROKE EDUCATION PROVIDED/REVIEWED AND GIVEN TO PATIENT Stroke warning signs and symptoms How to activate emergency medical system (call 911). Medications prescribed at discharge. Need for follow-up after discharge. Personal risk factors for stroke. Pneumonia vaccine given:  Flu vaccine given:  My questions have been answered, the writing is legible, and I understand these instructions.  I will adhere to these goals & educational materials that have been provided to me after my discharge from the hospital.    Information on my medicine - ELIQUIS (apixaban)  This medication education was reviewed with me or my healthcare representative as part of my discharge preparation.   Why was Eliquis prescribed for you? Eliquis was prescribed for you to reduce the risk of a blood clot forming that can cause a stroke if you have a medical condition called atrial fibrillation (a type of irregular heartbeat).  What do You need to know about Eliquis ? Take your Eliquis TWICE DAILY - one tablet in the morning and one tablet in the evening with or without food. If you have difficulty swallowing the tablet whole please discuss with your pharmacist how to take the medication safely.  Take Eliquis exactly as prescribed by your doctor and DO NOT stop taking Eliquis without talking to the  doctor who prescribed the medication.  Stopping may increase your risk of developing a stroke.  Refill your prescription before you run out.  After discharge, you should have regular check-up appointments with your healthcare provider that is prescribing your Eliquis.  In the future your dose may need to be changed if your kidney function or weight changes by a significant amount or as you get older.  What do you do if you miss a dose? If you miss a dose, take it as soon as you remember on the same day and resume taking twice daily.  Do not take more than one dose of ELIQUIS at the same time to make up a missed dose.  Important Safety Information A possible side effect of Eliquis is bleeding. You should call your healthcare provider right away if you experience any of the following: ? Bleeding from an injury or your nose that does not stop. ? Unusual colored urine (red or dark brown) or unusual colored stools (red or black). ? Unusual bruising for unknown reasons. ? A serious fall or if you hit your head (even if there is no bleeding).  Some medicines may interact with Eliquis and might increase your risk of bleeding or clotting while on Eliquis. To help avoid this, consult your healthcare provider or pharmacist prior to using  any new prescription or non-prescription medications, including herbals, vitamins, non-steroidal anti-inflammatory drugs (NSAIDs) and supplements.  This website has more information on Eliquis (apixaban): http://www.eliquis.com/eliquis/home

## 2015-06-04 NOTE — Progress Notes (Signed)
Follow-up cranial CT scan completed and reviewed by neurology Dr. Roda ShuttersXu with recommendations to resume Eliquis as prior to admission and continue Plavix as well. Discontinue aspirin now that Eliquis was resumed

## 2015-06-05 MED ORDER — FUROSEMIDE 20 MG PO TABS: 20.0000 mg | ORAL_TABLET | Freq: Every day | ORAL | Status: DC

## 2015-06-05 MED ORDER — PANTOPRAZOLE SODIUM 40 MG PO TBEC: 40.0000 mg | DELAYED_RELEASE_TABLET | Freq: Every day | ORAL | Status: DC

## 2015-06-05 MED ORDER — ROSUVASTATIN CALCIUM 20 MG PO TABS: 20.0000 mg | ORAL_TABLET | Freq: Every day | ORAL | Status: DC

## 2015-06-05 MED ORDER — AMIODARONE HCL 200 MG PO TABS: ORAL_TABLET | ORAL | Status: DC

## 2015-06-05 MED ORDER — CLOPIDOGREL BISULFATE 75 MG PO TABS: 75.0000 mg | ORAL_TABLET | Freq: Every day | ORAL | Status: AC

## 2015-06-05 MED ORDER — METOPROLOL TARTRATE 25 MG PO TABS: 12.5000 mg | ORAL_TABLET | Freq: Two times a day (BID) | ORAL | Status: DC

## 2015-06-05 MED ORDER — APIXABAN 5 MG PO TABS: 5.0000 mg | ORAL_TABLET | Freq: Two times a day (BID) | ORAL | Status: AC

## 2015-06-05 NOTE — Progress Notes (Signed)
Subjective/Complaints: No new issues. Working on word puzzles bedside.   ROS denies CP, SOB, abd pain , N/V,   Objective: Vital Signs: Blood pressure 89/54, pulse 62, temperature 98.5 F (36.9 C), temperature source Oral, resp. rate 19, weight 79.9 kg (176 lb 2.4 oz), SpO2 100 %. Ct Head Wo Contrast  06/03/2015  CLINICAL DATA:  Follow-up ischemic left MCA stroke. EXAM: CT HEAD WITHOUT CONTRAST TECHNIQUE: Contiguous axial images were obtained from the base of the skull through the vertex without intravenous contrast. COMPARISON:  05/28/2015 FINDINGS: Previously seen left MCA infarct is not as well visualized as on prior study. No new areas of hemorrhage or infarction. Decreasing low-density in the region of the left frontal lobe and insular cortex. Mild chronic small vessel disease throughout the deep white matter. No hydrocephalus or mass effect. No midline shift. No acute calvarial abnormality. IMPRESSION: Decreasing conspicuity of the left MCA infarct previously seen. No new areas of infarct or hemorrhage. Electronically Signed   By: Charlett Nose M.D.   On: 06/03/2015 10:25   No results found for this or any previous visit (from the past 72 hour(s)).    General: No acute distress. Vital signs reviewed Psych: Mood and affect are appropriate Heart: Regular rate and rhythm no rubs murmurs or extra sounds Lungs: Clear to auscultation, breathing unlabored, no rales or wheezes Abdomen: Positive bowel sounds, soft nontender to palpation, nondistended Skin: No evidence of breakdown, no evidence of rash Neurologic:  Motor strength is 5/5 in bilateral deltoid, bicep, tricep, grip, hip flexor, knee extensors, left  ankle dorsiflexor and plantar flexor 2/5 R ankle DF/PF.  Marland Kitchen Better with spontaneous speech but continued word finding difficultoies Musculoskeletal: No edema. No tenderness  Assessment/Plan: 1. Functional deficits secondary to Left MCA infarct with aphasia and gait d/o which require  3+ hours per day of interdisciplinary therapy in a comprehensive inpatient rehab setting. Physiatrist is providing close team supervision and 24 hour management of active medical problems listed below. Physiatrist and rehab team continue to assess barriers to discharge/monitor patient progress toward functional and medical goals. FIM: Function - Bathing Bathing activity did not occur: Refused Position: Sport and exercise psychologist parts bathed by patient: Buttocks, Front perineal area, Right arm, Left arm, Chest, Abdomen, Right upper leg, Left upper leg, Right lower leg, Left lower leg Body parts bathed by helper: Back, Left lower leg, Right lower leg Bathing not applicable: Right arm, Left arm, Chest, Abdomen, Right upper leg, Left upper leg, Right lower leg, Left lower leg, Back Assist Level: Supervision or verbal cues  Function- Upper Body Dressing/Undressing Upper body dressing/undressing activity did not occur: Refused What is the patient wearing?: Pull over shirt/dress Pull over shirt/dress - Perfomed by patient: Thread/unthread right sleeve, Thread/unthread left sleeve, Put head through opening, Pull shirt over trunk Assist Level: Supervision or verbal cues Set up : To obtain clothing/put away Function - Lower Body Dressing/Undressing Lower body dressing/undressing activity did not occur: Refused What is the patient wearing?: Underwear, Pants, Socks, Shoes, AFO Position: Sitting EOB Underwear - Performed by patient: Thread/unthread left underwear leg, Pull underwear up/down, Thread/unthread right underwear leg Underwear - Performed by helper: Thread/unthread right underwear leg Pants- Performed by patient: Thread/unthread right pants leg, Thread/unthread left pants leg, Pull pants up/down Pants- Performed by helper: Thread/unthread right pants leg, Thread/unthread left pants leg, Pull pants up/down Non-skid slipper socks- Performed by patient: Don/doff right sock, Don/doff left sock Non-skid slipper  socks- Performed by helper: Don/doff right sock, Don/doff left sock Socks -  Performed by patient: Don/doff right sock, Don/doff left sock Shoes - Performed by patient: Don/doff right shoe, Don/doff left shoe, Fasten right, Fasten left AFO - Performed by patient: Don/doff right AFO, Don/doff left AFO Assist for footwear: Dependant Assist for lower body dressing: Supervision or verbal cues  Function - Toileting Toileting activity did not occur: No continent bowel/bladder event Toileting steps completed by patient: Adjust clothing prior to toileting, Performs perineal hygiene, Adjust clothing after toileting Toileting steps completed by helper: Adjust clothing after toileting Assist level: Supervision or verbal cues  Function - ArchivistToilet Transfers Toilet transfer activity did not occur: Safety/medical concerns Toilet transfer assistive device: Grab bar Assist level to toilet: Supervision or verbal cues Assist level from toilet: Supervision or verbal cues  Function - Chair/bed transfer Chair/bed transfer method: Stand pivot Chair/bed transfer assist level: No Help, no cues, assistive device, takes more than a reasonable amount of time Chair/bed transfer assistive device: Orthosis Chair/bed transfer details: Verbal cues for precautions/safety, Verbal cues for technique  Function - Locomotion: Wheelchair Will patient use wheelchair at discharge?: No Type: Manual Max wheelchair distance: 25' Assist Level: Touching or steadying assistance (Pt > 75%) Assist Level: Touching or steadying assistance (Pt > 75%) Wheel 150 feet activity did not occur: Safety/medical concerns Turns around,maneuvers to table,bed, and toilet,negotiates 3% grade,maneuvers on rugs and over doorsills: No Function - Locomotion: Ambulation Assistive device: No device, Orthosis Max distance: 200 Assist level: Supervision or verbal cues Assist level: Supervision or verbal cues Assist level: Supervision or verbal cues Walk  150 feet activity did not occur: Safety/medical concerns Assist level: Supervision or verbal cues Assist level: Supervision or verbal cues  Function - Comprehension Comprehension: Auditory Comprehension assist level: Understands basic 75 - 89% of the time/ requires cueing 10 - 24% of the time  Function - Expression Expression: Verbal Expression assist level: Expresses basic 50 - 74% of the time/requires cueing 25 - 49% of the time. Needs to repeat parts of sentences.  Function - Social Interaction Social Interaction assist level: Interacts appropriately 75 - 89% of the time - Needs redirection for appropriate language or to initiate interaction.  Function - Problem Solving Problem solving assist level: Solves basic 75 - 89% of the time/requires cueing 10 - 24% of the time  Function - Memory Memory assist level: Recognizes or recalls 50 - 74% of the time/requires cueing 25 - 49% of the time Patient normally able to recall (first 3 days only): None of the above  Medical Problem List and Plan: 1.  Right hemiparesis/aphasia  secondary to left MCA infarct secondary to left ICA /MCA occlusion status post revascularization with mechanical thrombectomy               - Dc home today. Follow up arranged  -Patient to see MD in the office for transitional care encounter in 1-2 weeks.  -AFO RLE 2.  DVT Prophylaxis/Anticoagulation: SCDs   3. Pain Management/history of multiple back surgeries: Tylenol as needed               -pain appears controlled at present 4. Dysphagia. Now on regular diet. Tolerating well 5. Neuropsych: This patient is  not capable of making decisions on his  own behalf 6. Skin/Wound Care: Routine skin checks   7. Fluids/Electrolytes/Nutrition: Routine I&O  wt stable at 82Kg 8. Atrial fibrillation/hypertropic cardiomyopathy/pacemaker. Follow-up per cardiology services. Heart rate controlled at present. amiodarone  decreased to 200 mg twice daily on 4/4 and then decrease to  200 mg daily  on 4/18 9. Hypertension. Lisinopril 10 mg daily, decreased to 5 mg on 4/7, decreased to 2.5 on 4/9, metoprolol 12.5mg  BID . Monitor with increased mobility  -lisinopril stopped -can follow up with primary after discharge regarding resumption 10. Hyperlipidemia. Crestor 11. Pulmonary edema. Improved after IV lasix, now on po lasix,maintaining sats off O2 will d/c 12.  Aphasia- SLP eval 13.  Increased stooling but stools are formed   Incontinence appears to be intermittent  LOS (Days) 12 A FACE TO FACE EVALUATION WAS PERFORMED  SWARTZ,ZACHARY T 06/05/2015, 8:26 AM

## 2015-06-05 NOTE — Progress Notes (Signed)
Pt given discharge instructions via Dan Finlandanguilla, PA. Pt discharged to home with family.

## 2015-06-06 ENCOUNTER — Telehealth: Payer: Self-pay

## 2015-06-06 NOTE — Telephone Encounter (Signed)
1. Are you/is patient experiencing any problems since coming home? Are there any questions regarding any aspect of care? No 2. Are there any questions regarding medications administration/dosing? Are meds being taken as prescribed? Patient should review meds with caller to confirm. Meds confirmed.  3. Have there been any falls? No 4. Has Home Health been to the house and/or have they contacted you? If not, have you tried to contact them? Can we help you contact them? [facility] has contacted pt.  5. Are bowels and bladder emptying properly? Are there any unexpected incontinence issues? If applicable, is patient following bowel/bladder programs? Had an accident the night of coming home. Pt was able to care for himself after it happened.   6. Any fevers, problems with breathing, unexpected pain? No  7. Are there any skin problems or new areas of breakdown? No 8. Has the patient/family member arranged specialty MD follow up (ie cardiology/neurology/renal/surgical/etc)?  Can we help arrange? Appointments have been arranged.  9. Does the patient need any other services or support that we can help arrange? No 10. Are caregivers following through as expected in assisting the patient? Yes, son and daughter. Has the patient quit smoking, drinking alcohol, or using drugs as recommended? Pt has smoked since discharge.Pt has not drank alcohol or used drugs.    Pt is aware of appointment on 06/17/15 @ 10:00 am

## 2015-06-07 DIAGNOSIS — E785 Hyperlipidemia, unspecified: Secondary | ICD-10-CM | POA: Diagnosis not present

## 2015-06-07 DIAGNOSIS — I10 Essential (primary) hypertension: Secondary | ICD-10-CM | POA: Diagnosis not present

## 2015-06-07 DIAGNOSIS — I69320 Aphasia following cerebral infarction: Secondary | ICD-10-CM | POA: Diagnosis not present

## 2015-06-07 DIAGNOSIS — Z950 Presence of cardiac pacemaker: Secondary | ICD-10-CM | POA: Diagnosis not present

## 2015-06-07 DIAGNOSIS — I69351 Hemiplegia and hemiparesis following cerebral infarction affecting right dominant side: Secondary | ICD-10-CM | POA: Diagnosis not present

## 2015-06-07 DIAGNOSIS — I4891 Unspecified atrial fibrillation: Secondary | ICD-10-CM | POA: Diagnosis not present

## 2015-06-07 DIAGNOSIS — G8929 Other chronic pain: Secondary | ICD-10-CM | POA: Diagnosis not present

## 2015-06-07 DIAGNOSIS — Z7901 Long term (current) use of anticoagulants: Secondary | ICD-10-CM | POA: Diagnosis not present

## 2015-06-08 DIAGNOSIS — I69320 Aphasia following cerebral infarction: Secondary | ICD-10-CM | POA: Diagnosis not present

## 2015-06-08 DIAGNOSIS — I69351 Hemiplegia and hemiparesis following cerebral infarction affecting right dominant side: Secondary | ICD-10-CM | POA: Diagnosis not present

## 2015-06-08 DIAGNOSIS — G8929 Other chronic pain: Secondary | ICD-10-CM | POA: Diagnosis not present

## 2015-06-08 DIAGNOSIS — I10 Essential (primary) hypertension: Secondary | ICD-10-CM | POA: Diagnosis not present

## 2015-06-08 DIAGNOSIS — E785 Hyperlipidemia, unspecified: Secondary | ICD-10-CM | POA: Diagnosis not present

## 2015-06-08 DIAGNOSIS — I4891 Unspecified atrial fibrillation: Secondary | ICD-10-CM | POA: Diagnosis not present

## 2015-06-08 DIAGNOSIS — Z950 Presence of cardiac pacemaker: Secondary | ICD-10-CM | POA: Diagnosis not present

## 2015-06-08 DIAGNOSIS — Z7901 Long term (current) use of anticoagulants: Secondary | ICD-10-CM | POA: Diagnosis not present

## 2015-06-10 DIAGNOSIS — I69190 Apraxia following nontraumatic intracerebral hemorrhage: Secondary | ICD-10-CM | POA: Diagnosis not present

## 2015-06-10 DIAGNOSIS — I6930 Unspecified sequelae of cerebral infarction: Secondary | ICD-10-CM | POA: Diagnosis not present

## 2015-06-11 ENCOUNTER — Other Ambulatory Visit (HOSPITAL_COMMUNITY): Payer: Self-pay | Admitting: Interventional Radiology

## 2015-06-11 DIAGNOSIS — I69320 Aphasia following cerebral infarction: Secondary | ICD-10-CM | POA: Diagnosis not present

## 2015-06-11 DIAGNOSIS — I6350 Cerebral infarction due to unspecified occlusion or stenosis of unspecified cerebral artery: Secondary | ICD-10-CM

## 2015-06-11 DIAGNOSIS — I69351 Hemiplegia and hemiparesis following cerebral infarction affecting right dominant side: Secondary | ICD-10-CM | POA: Diagnosis not present

## 2015-06-11 DIAGNOSIS — E785 Hyperlipidemia, unspecified: Secondary | ICD-10-CM | POA: Diagnosis not present

## 2015-06-11 DIAGNOSIS — G8929 Other chronic pain: Secondary | ICD-10-CM | POA: Diagnosis not present

## 2015-06-11 DIAGNOSIS — Z7901 Long term (current) use of anticoagulants: Secondary | ICD-10-CM | POA: Diagnosis not present

## 2015-06-11 DIAGNOSIS — Z950 Presence of cardiac pacemaker: Secondary | ICD-10-CM | POA: Diagnosis not present

## 2015-06-11 DIAGNOSIS — I10 Essential (primary) hypertension: Secondary | ICD-10-CM | POA: Diagnosis not present

## 2015-06-11 DIAGNOSIS — I4891 Unspecified atrial fibrillation: Secondary | ICD-10-CM | POA: Diagnosis not present

## 2015-06-12 DIAGNOSIS — Z7901 Long term (current) use of anticoagulants: Secondary | ICD-10-CM | POA: Diagnosis not present

## 2015-06-12 DIAGNOSIS — I4891 Unspecified atrial fibrillation: Secondary | ICD-10-CM | POA: Diagnosis not present

## 2015-06-12 DIAGNOSIS — G8929 Other chronic pain: Secondary | ICD-10-CM | POA: Diagnosis not present

## 2015-06-12 DIAGNOSIS — Z950 Presence of cardiac pacemaker: Secondary | ICD-10-CM | POA: Diagnosis not present

## 2015-06-12 DIAGNOSIS — I69351 Hemiplegia and hemiparesis following cerebral infarction affecting right dominant side: Secondary | ICD-10-CM | POA: Diagnosis not present

## 2015-06-12 DIAGNOSIS — I69320 Aphasia following cerebral infarction: Secondary | ICD-10-CM | POA: Diagnosis not present

## 2015-06-12 DIAGNOSIS — I10 Essential (primary) hypertension: Secondary | ICD-10-CM | POA: Diagnosis not present

## 2015-06-12 DIAGNOSIS — E785 Hyperlipidemia, unspecified: Secondary | ICD-10-CM | POA: Diagnosis not present

## 2015-06-13 DIAGNOSIS — I69320 Aphasia following cerebral infarction: Secondary | ICD-10-CM | POA: Diagnosis not present

## 2015-06-13 DIAGNOSIS — I4891 Unspecified atrial fibrillation: Secondary | ICD-10-CM | POA: Diagnosis not present

## 2015-06-13 DIAGNOSIS — I10 Essential (primary) hypertension: Secondary | ICD-10-CM | POA: Diagnosis not present

## 2015-06-13 DIAGNOSIS — Z950 Presence of cardiac pacemaker: Secondary | ICD-10-CM | POA: Diagnosis not present

## 2015-06-13 DIAGNOSIS — Z7901 Long term (current) use of anticoagulants: Secondary | ICD-10-CM | POA: Diagnosis not present

## 2015-06-13 DIAGNOSIS — I69351 Hemiplegia and hemiparesis following cerebral infarction affecting right dominant side: Secondary | ICD-10-CM | POA: Diagnosis not present

## 2015-06-13 DIAGNOSIS — E785 Hyperlipidemia, unspecified: Secondary | ICD-10-CM | POA: Diagnosis not present

## 2015-06-13 DIAGNOSIS — G8929 Other chronic pain: Secondary | ICD-10-CM | POA: Diagnosis not present

## 2015-06-14 DIAGNOSIS — I69351 Hemiplegia and hemiparesis following cerebral infarction affecting right dominant side: Secondary | ICD-10-CM | POA: Diagnosis not present

## 2015-06-14 DIAGNOSIS — I69320 Aphasia following cerebral infarction: Secondary | ICD-10-CM | POA: Diagnosis not present

## 2015-06-14 DIAGNOSIS — Z7901 Long term (current) use of anticoagulants: Secondary | ICD-10-CM | POA: Diagnosis not present

## 2015-06-14 DIAGNOSIS — E785 Hyperlipidemia, unspecified: Secondary | ICD-10-CM | POA: Diagnosis not present

## 2015-06-14 DIAGNOSIS — I4891 Unspecified atrial fibrillation: Secondary | ICD-10-CM | POA: Diagnosis not present

## 2015-06-14 DIAGNOSIS — I10 Essential (primary) hypertension: Secondary | ICD-10-CM | POA: Diagnosis not present

## 2015-06-14 DIAGNOSIS — Z950 Presence of cardiac pacemaker: Secondary | ICD-10-CM | POA: Diagnosis not present

## 2015-06-14 DIAGNOSIS — G8929 Other chronic pain: Secondary | ICD-10-CM | POA: Diagnosis not present

## 2015-06-17 ENCOUNTER — Encounter: Payer: Medicare HMO | Admitting: Physical Medicine & Rehabilitation

## 2015-06-18 DIAGNOSIS — I69320 Aphasia following cerebral infarction: Secondary | ICD-10-CM | POA: Diagnosis not present

## 2015-06-18 DIAGNOSIS — Z950 Presence of cardiac pacemaker: Secondary | ICD-10-CM | POA: Diagnosis not present

## 2015-06-18 DIAGNOSIS — E785 Hyperlipidemia, unspecified: Secondary | ICD-10-CM | POA: Diagnosis not present

## 2015-06-18 DIAGNOSIS — Z7901 Long term (current) use of anticoagulants: Secondary | ICD-10-CM | POA: Diagnosis not present

## 2015-06-18 DIAGNOSIS — I69351 Hemiplegia and hemiparesis following cerebral infarction affecting right dominant side: Secondary | ICD-10-CM | POA: Diagnosis not present

## 2015-06-18 DIAGNOSIS — I10 Essential (primary) hypertension: Secondary | ICD-10-CM | POA: Diagnosis not present

## 2015-06-18 DIAGNOSIS — I4891 Unspecified atrial fibrillation: Secondary | ICD-10-CM | POA: Diagnosis not present

## 2015-06-18 DIAGNOSIS — G8929 Other chronic pain: Secondary | ICD-10-CM | POA: Diagnosis not present

## 2015-06-19 DIAGNOSIS — I69351 Hemiplegia and hemiparesis following cerebral infarction affecting right dominant side: Secondary | ICD-10-CM | POA: Diagnosis not present

## 2015-06-19 DIAGNOSIS — I4891 Unspecified atrial fibrillation: Secondary | ICD-10-CM | POA: Diagnosis not present

## 2015-06-19 DIAGNOSIS — Z950 Presence of cardiac pacemaker: Secondary | ICD-10-CM | POA: Diagnosis not present

## 2015-06-19 DIAGNOSIS — G8929 Other chronic pain: Secondary | ICD-10-CM | POA: Diagnosis not present

## 2015-06-19 DIAGNOSIS — I69320 Aphasia following cerebral infarction: Secondary | ICD-10-CM | POA: Diagnosis not present

## 2015-06-19 DIAGNOSIS — E785 Hyperlipidemia, unspecified: Secondary | ICD-10-CM | POA: Diagnosis not present

## 2015-06-19 DIAGNOSIS — Z7901 Long term (current) use of anticoagulants: Secondary | ICD-10-CM | POA: Diagnosis not present

## 2015-06-19 DIAGNOSIS — I10 Essential (primary) hypertension: Secondary | ICD-10-CM | POA: Diagnosis not present

## 2015-06-21 DIAGNOSIS — Z950 Presence of cardiac pacemaker: Secondary | ICD-10-CM | POA: Diagnosis not present

## 2015-06-21 DIAGNOSIS — I69351 Hemiplegia and hemiparesis following cerebral infarction affecting right dominant side: Secondary | ICD-10-CM | POA: Diagnosis not present

## 2015-06-21 DIAGNOSIS — I69320 Aphasia following cerebral infarction: Secondary | ICD-10-CM | POA: Diagnosis not present

## 2015-06-21 DIAGNOSIS — Z7901 Long term (current) use of anticoagulants: Secondary | ICD-10-CM | POA: Diagnosis not present

## 2015-06-21 DIAGNOSIS — I10 Essential (primary) hypertension: Secondary | ICD-10-CM | POA: Diagnosis not present

## 2015-06-21 DIAGNOSIS — E785 Hyperlipidemia, unspecified: Secondary | ICD-10-CM | POA: Diagnosis not present

## 2015-06-21 DIAGNOSIS — I4891 Unspecified atrial fibrillation: Secondary | ICD-10-CM | POA: Diagnosis not present

## 2015-06-21 DIAGNOSIS — G8929 Other chronic pain: Secondary | ICD-10-CM | POA: Diagnosis not present

## 2015-06-25 DIAGNOSIS — I4891 Unspecified atrial fibrillation: Secondary | ICD-10-CM | POA: Diagnosis not present

## 2015-06-25 DIAGNOSIS — I69351 Hemiplegia and hemiparesis following cerebral infarction affecting right dominant side: Secondary | ICD-10-CM | POA: Diagnosis not present

## 2015-06-25 DIAGNOSIS — Z950 Presence of cardiac pacemaker: Secondary | ICD-10-CM | POA: Diagnosis not present

## 2015-06-25 DIAGNOSIS — I10 Essential (primary) hypertension: Secondary | ICD-10-CM | POA: Diagnosis not present

## 2015-06-25 DIAGNOSIS — I69320 Aphasia following cerebral infarction: Secondary | ICD-10-CM | POA: Diagnosis not present

## 2015-06-25 DIAGNOSIS — G8929 Other chronic pain: Secondary | ICD-10-CM | POA: Diagnosis not present

## 2015-06-25 DIAGNOSIS — Z7901 Long term (current) use of anticoagulants: Secondary | ICD-10-CM | POA: Diagnosis not present

## 2015-06-25 DIAGNOSIS — E785 Hyperlipidemia, unspecified: Secondary | ICD-10-CM | POA: Diagnosis not present

## 2015-07-02 ENCOUNTER — Ambulatory Visit (HOSPITAL_COMMUNITY): Admission: RE | Admit: 2015-07-02 | Payer: Medicare HMO | Source: Ambulatory Visit

## 2015-07-02 DIAGNOSIS — Z950 Presence of cardiac pacemaker: Secondary | ICD-10-CM | POA: Diagnosis not present

## 2015-07-02 DIAGNOSIS — Z7901 Long term (current) use of anticoagulants: Secondary | ICD-10-CM | POA: Diagnosis not present

## 2015-07-02 DIAGNOSIS — I10 Essential (primary) hypertension: Secondary | ICD-10-CM | POA: Diagnosis not present

## 2015-07-02 DIAGNOSIS — I4891 Unspecified atrial fibrillation: Secondary | ICD-10-CM | POA: Diagnosis not present

## 2015-07-02 DIAGNOSIS — E785 Hyperlipidemia, unspecified: Secondary | ICD-10-CM | POA: Diagnosis not present

## 2015-07-02 DIAGNOSIS — I69351 Hemiplegia and hemiparesis following cerebral infarction affecting right dominant side: Secondary | ICD-10-CM | POA: Diagnosis not present

## 2015-07-02 DIAGNOSIS — I69320 Aphasia following cerebral infarction: Secondary | ICD-10-CM | POA: Diagnosis not present

## 2015-07-02 DIAGNOSIS — G8929 Other chronic pain: Secondary | ICD-10-CM | POA: Diagnosis not present

## 2015-07-04 ENCOUNTER — Encounter: Payer: Self-pay | Admitting: Physical Medicine & Rehabilitation

## 2015-07-04 ENCOUNTER — Ambulatory Visit (HOSPITAL_BASED_OUTPATIENT_CLINIC_OR_DEPARTMENT_OTHER): Payer: Medicare HMO | Admitting: Physical Medicine & Rehabilitation

## 2015-07-04 ENCOUNTER — Encounter: Payer: Medicare HMO | Attending: Physical Medicine & Rehabilitation

## 2015-07-04 VITALS — BP 142/75 | HR 90 | Resp 14

## 2015-07-04 DIAGNOSIS — Z5189 Encounter for other specified aftercare: Secondary | ICD-10-CM | POA: Insufficient documentation

## 2015-07-04 DIAGNOSIS — R269 Unspecified abnormalities of gait and mobility: Secondary | ICD-10-CM | POA: Insufficient documentation

## 2015-07-04 DIAGNOSIS — G8191 Hemiplegia, unspecified affecting right dominant side: Secondary | ICD-10-CM | POA: Diagnosis not present

## 2015-07-04 DIAGNOSIS — I69398 Other sequelae of cerebral infarction: Secondary | ICD-10-CM | POA: Diagnosis not present

## 2015-07-04 DIAGNOSIS — I69351 Hemiplegia and hemiparesis following cerebral infarction affecting right dominant side: Secondary | ICD-10-CM | POA: Diagnosis not present

## 2015-07-04 DIAGNOSIS — I69320 Aphasia following cerebral infarction: Secondary | ICD-10-CM

## 2015-07-04 DIAGNOSIS — IMO0002 Reserved for concepts with insufficient information to code with codable children: Secondary | ICD-10-CM

## 2015-07-04 NOTE — Progress Notes (Signed)
Subjective:    Patient ID: Luis Robinson, male    DOB: December 13, 1940, 75 y.o.   MRN: [id number] 75 year old right-handed male, history of hypertrophic cardiomyopathy, PAF with ablation, pacemaker maintained on Eliquis, followed by Dr. Dulce [facility]ellar at [facility], tobacco abuse. Admitted on May 20, 2015, after a fall with acute onset of right-sided weakness, inability to speak. 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 Mild mass effect upon the left lateral ventricle. Found to have atrial fibrillation with RVR and intravenous tPA not given as patient had taken his dose of Eliquis already. Underwent cerebral angiogram, complete revascularization of occluded left MCA and single pass Solitaire and tPA, incomplete revascularization of left ICA occlusion with tPA stenting assisted angioplasty CIR DATE OF ADMISSION: 05/23/2015 DATE OF DISCHARGE: 06/05/2015 HPI Patient has received home health services since discharge from inpatient rehabilitation. PT and OT have discontinued due to good progress. Still receiving speech therapy. No falls reported. Still needs some supervision for bathing but independently dresses. He does some dishes but does not cook.  Seen by PCP 1 wk after d/c PCP stopped lasix due to incontinence [facility] Has to make appt with neurologist  Occ bladder incont, bowels are ok Patient accompanied by daughter Pain Inventory Average Pain 0 Pain Right Now 0 My pain is no  pain  In the last 24 hours, has pain interfered with the following? General activity 0 Relation with others 0 Enjoyment of life 0 What TIME of day is your pain at its worst? no  pain Sleep (in general) Good  Pain is worse with: no  pain Pain improves with: no  pain Relief from Meds: no  pain  Mobility walk with assistance use a cane ability to climb steps?  yes do you drive?  no  Function retired I need  assistance with the following:  bathing, meal prep and household duties Do you have any goals in this area?  yes  Neuro/Psych bladder control problems weakness  Prior Studies Any changes since last visit?  no  Physicians involved in your care Any changes since last visit?  no   Family History  Problem Relation Age of Onset  . Cancer Sister 60   Social History   Social History  . Marital Status: Widowed    Spouse Name: N/A  . Number of Children: N/A  . Years of Education: N/A   Social History Main Topics  . Smoking status: Current Every Day Smoker -- 0.50 packs/day    Types: Cigarettes  . Smokeless tobacco: Never Used  . Alcohol Use: No  . Drug Use: No  . Sexual Activity: Not Asked   Other Topics Concern  . None   Social History Narrative   Past Surgical History  Procedure Laterality Date  . Back surgery x 2    . Rotator cuff surgery    . Insert / replace / remove pacemaker  2008    MDT  . Radiology with anesthesia N/A 05/20/2015    Procedure: RADIOLOGY WITH ANESTHESIA;  Surgeon: Medication Radiologist, MD;  Location: [facility] OR;  Service: Radiology;  Laterality: N/A;  . Ablation  Nov 2016    [facility]   Past Medical History  Diagnosis Date  . Hypertrophic cardiomyopathy (HCC)   . Hyperlipidemia   . Hypertension   . Tobacco use disorder   . Dyspnea   . Dizziness   . PAF (paroxysmal atrial fibrillation) (HCC)   . SSS (sick sinus  syndrome) (HCC)   . CAD S/P percutaneous coronary angioplasty Aug 2016    Dx1 DES, no other significant dis  . Chronic anticoagulation     Eliquis   BP 142/75 mmHg  Pulse 90  Resp 14  SpO2 98%  Opioid Risk Score:   Fall Risk Score:  `1  Depression screen PHQ 2/9  Depression screen PHQ 2/9 07/04/2015  Decreased Interest 0  Down, Depressed, Hopeless 0  PHQ - 2 Score 0  Altered sleeping 0  Tired, decreased energy 0  Change in appetite 0  Feeling bad or failure about yourself  0  Trouble concentrating 0  Moving slowly or  fidgety/restless 0  Suicidal thoughts 0  PHQ-9 Score 0     Review of Systems  All other systems reviewed and are negative.      Objective:   Physical Exam  Constitutional: He appears well-developed and well-nourished.  HENT:  Head: Normocephalic and atraumatic.  Eyes: Conjunctivae are normal. Pupils are equal, round, and reactive to light.  Cardiovascular: Normal rate and regular rhythm.   Pulmonary/Chest: Effort normal and breath sounds normal.  Abdominal: Soft.  Neurological: He is alert.  Aphasic with reduced naming, had difficulty with stethoscope, ok with ring and pen  Difficulty with orientation due to naming problem  Skin: Skin is warm and dry.  Psychiatric: He has a normal mood and affect.  Nursing note and vitals reviewed.  Motor strength is 4+ in the right deltoid, biceps, triceps, grip, hip flexor, knee extensor, trace ankle dorsiflexor 3 minus ankle plantar flexor Fine motor is reduced in the right hand. There is dysdiadochokinesis on rapid alternating movements right upper extremity Gait is without evidence of toe drag or knee and knee instability with the brace on. He uses a straight cane.No physical assistance needed No obvious visual field loss during gait      Assessment & Plan:  1.  Left MCA distribution infarct with excellent functional improvement after revascularization procedure. He has completed inpatient rehabilitation and is soon completing home health therapy. He would benefit from additional outpatient physical therapy as well as speech therapy. We discussed timeframe recovery of physical aspects 3-6 months and for a aphasia and cognitive aspects 9-12 months.  Patient will follow up with PCP for general medical care secondary stroke prevention Follow-up with stroke neurologist for secondary stroke prevention is currently on Eliquis AND Plavix question duration, Neurology can assess driving capacity Also recommend ophthalmology follow-up to assess for  any subtle visual field loss that can interfere with driving [facility]  Discussed findings and recommendations as well as prognosis with the patient and his daughter.

## 2015-07-04 NOTE — Patient Instructions (Signed)
Have written referral for outpt Speech and physical therapy  Have ophthalmologist check for visual field loss related to stroke  Asked neurology about clearance for driving.

## 2015-07-08 DIAGNOSIS — E785 Hyperlipidemia, unspecified: Secondary | ICD-10-CM | POA: Diagnosis not present

## 2015-07-08 DIAGNOSIS — I4891 Unspecified atrial fibrillation: Secondary | ICD-10-CM | POA: Diagnosis not present

## 2015-07-08 DIAGNOSIS — Z7901 Long term (current) use of anticoagulants: Secondary | ICD-10-CM | POA: Diagnosis not present

## 2015-07-08 DIAGNOSIS — I69320 Aphasia following cerebral infarction: Secondary | ICD-10-CM | POA: Diagnosis not present

## 2015-07-08 DIAGNOSIS — Z950 Presence of cardiac pacemaker: Secondary | ICD-10-CM | POA: Diagnosis not present

## 2015-07-08 DIAGNOSIS — G8929 Other chronic pain: Secondary | ICD-10-CM | POA: Diagnosis not present

## 2015-07-08 DIAGNOSIS — I69351 Hemiplegia and hemiparesis following cerebral infarction affecting right dominant side: Secondary | ICD-10-CM | POA: Diagnosis not present

## 2015-07-08 DIAGNOSIS — I10 Essential (primary) hypertension: Secondary | ICD-10-CM | POA: Diagnosis not present

## 2015-07-09 DIAGNOSIS — I10 Essential (primary) hypertension: Secondary | ICD-10-CM | POA: Diagnosis present

## 2015-07-09 DIAGNOSIS — Z79899 Other long term (current) drug therapy: Secondary | ICD-10-CM

## 2015-07-09 DIAGNOSIS — I480 Paroxysmal atrial fibrillation: Secondary | ICD-10-CM | POA: Insufficient documentation

## 2015-07-09 HISTORY — DX: Essential (primary) hypertension: I10

## 2015-07-09 HISTORY — DX: Other long term (current) drug therapy: Z79.899

## 2015-07-10 DIAGNOSIS — Z950 Presence of cardiac pacemaker: Secondary | ICD-10-CM | POA: Diagnosis not present

## 2015-07-10 DIAGNOSIS — I480 Paroxysmal atrial fibrillation: Secondary | ICD-10-CM | POA: Diagnosis not present

## 2015-07-10 DIAGNOSIS — Z45018 Encounter for adjustment and management of other part of cardiac pacemaker: Secondary | ICD-10-CM | POA: Diagnosis not present

## 2015-07-10 DIAGNOSIS — I63512 Cerebral infarction due to unspecified occlusion or stenosis of left middle cerebral artery: Secondary | ICD-10-CM | POA: Diagnosis not present

## 2015-07-10 DIAGNOSIS — E7801 Familial hypercholesterolemia: Secondary | ICD-10-CM | POA: Diagnosis not present

## 2015-07-10 DIAGNOSIS — Z7901 Long term (current) use of anticoagulants: Secondary | ICD-10-CM | POA: Diagnosis not present

## 2015-07-10 DIAGNOSIS — Z79899 Other long term (current) drug therapy: Secondary | ICD-10-CM | POA: Diagnosis not present

## 2015-07-23 ENCOUNTER — Telehealth (HOSPITAL_COMMUNITY): Payer: Self-pay | Admitting: *Deleted

## 2015-07-23 NOTE — Telephone Encounter (Signed)
Called and LM that appointment for June 5th needs to be rescheduled.  Asked them to return call.

## 2015-07-29 ENCOUNTER — Ambulatory Visit (HOSPITAL_COMMUNITY): Admission: RE | Admit: 2015-07-29 | Payer: Medicare HMO | Source: Ambulatory Visit

## 2015-08-08 ENCOUNTER — Ambulatory Visit (HOSPITAL_COMMUNITY)
Admission: RE | Admit: 2015-08-08 | Discharge: 2015-08-08 | Disposition: A | Discharge status: Home / Self Care | Payer: Medicare HMO | Source: Ambulatory Visit | Attending: Interventional Radiology | Admitting: Interventional Radiology

## 2015-08-08 DIAGNOSIS — Z8673 Personal history of transient ischemic attack (TIA), and cerebral infarction without residual deficits: Secondary | ICD-10-CM | POA: Diagnosis not present

## 2015-08-08 DIAGNOSIS — I6350 Cerebral infarction due to unspecified occlusion or stenosis of unspecified cerebral artery: Secondary | ICD-10-CM

## 2015-08-30 ENCOUNTER — Other Ambulatory Visit: Payer: Self-pay

## 2015-08-30 NOTE — Patient Outreach (Signed)
Outreach made to complete 90 day Modified Rankin Survey. HIPAA compliant voicemail left requesting callback.   Sharon SellerMichelle W. Kellyn Mansfield, MHA [facility] [phone]

## 2015-09-06 ENCOUNTER — Other Ambulatory Visit: Payer: Self-pay

## 2015-09-06 NOTE — Patient Outreach (Signed)
Call attempt x2 to complete modified rankin survey; HIPAA compliant voicemail left with patient and emergency contact requesting callback. Sharon SellerMichelle W. Ena Demary, MHA [facility] [phone]

## 2015-09-17 DIAGNOSIS — K922 Gastrointestinal hemorrhage, unspecified: Secondary | ICD-10-CM | POA: Diagnosis not present

## 2015-09-17 DIAGNOSIS — K921 Melena: Secondary | ICD-10-CM | POA: Diagnosis not present

## 2015-09-23 DIAGNOSIS — N390 Urinary tract infection, site not specified: Secondary | ICD-10-CM | POA: Diagnosis not present

## 2015-09-23 DIAGNOSIS — R319 Hematuria, unspecified: Secondary | ICD-10-CM | POA: Diagnosis not present

## 2015-09-23 DIAGNOSIS — K922 Gastrointestinal hemorrhage, unspecified: Secondary | ICD-10-CM | POA: Diagnosis not present

## 2015-10-01 ENCOUNTER — Telehealth (HOSPITAL_COMMUNITY): Payer: Self-pay

## 2015-10-01 NOTE — Telephone Encounter (Signed)
Called to schedule f/u ultrasound carotid, left message for pt to return call. AW 

## 2015-10-02 ENCOUNTER — Other Ambulatory Visit (HOSPITAL_COMMUNITY): Payer: Self-pay | Admitting: Interventional Radiology

## 2015-10-02 DIAGNOSIS — I771 Stricture of artery: Secondary | ICD-10-CM

## 2015-10-08 DIAGNOSIS — K921 Melena: Secondary | ICD-10-CM | POA: Diagnosis not present

## 2015-10-08 DIAGNOSIS — Z6828 Body mass index (BMI) 28.0-28.9, adult: Secondary | ICD-10-CM | POA: Diagnosis not present

## 2015-10-08 DIAGNOSIS — N390 Urinary tract infection, site not specified: Secondary | ICD-10-CM | POA: Diagnosis not present

## 2015-10-08 DIAGNOSIS — Z79899 Other long term (current) drug therapy: Secondary | ICD-10-CM | POA: Diagnosis not present

## 2015-10-10 DIAGNOSIS — Z950 Presence of cardiac pacemaker: Secondary | ICD-10-CM | POA: Diagnosis not present

## 2015-10-21 ENCOUNTER — Ambulatory Visit (HOSPITAL_COMMUNITY)
Admission: RE | Admit: 2015-10-21 | Discharge: 2015-10-21 | Disposition: A | Discharge status: Home / Self Care | Payer: Medicare HMO | Source: Ambulatory Visit | Attending: Interventional Radiology | Admitting: Interventional Radiology

## 2015-10-21 DIAGNOSIS — I771 Stricture of artery: Secondary | ICD-10-CM | POA: Diagnosis not present

## 2015-10-21 LAB — VAS US CAROTID
LCCADDIAS: 8 cm/s
LEFT ECA DIAS: 0 cm/s
LEFT VERTEBRAL DIAS: -5 cm/s
Left CCA dist sys: 51 cm/s
Left CCA prox dias: -7 cm/s
Left CCA prox sys: -65 cm/s
Left ICA dist dias: -13 cm/s
Left ICA dist sys: -48 cm/s
Left ICA prox dias: 20 cm/s
Left ICA prox sys: 69 cm/s
RCCADSYS: -43 cm/s
RCCAPDIAS: -7 cm/s
RIGHT ECA DIAS: -1 cm/s
RIGHT VERTEBRAL DIAS: -12 cm/s
Right CCA prox sys: -54 cm/s

## 2015-10-21 NOTE — Progress Notes (Signed)
**  Preliminary report by tech**  Carotid duplex completed. Findings are consistent with a 1-39 percent stenosis involving the right internal carotid artery. The left internal carotid artery findings suggest a less than 50 percent stenosis based upon the stent criteria. The vertebral arteries demonstrate antegrade flow.   10/21/15 10:47 AM Olen CordialGreg Nesiah Jump RVT

## 2015-11-05 ENCOUNTER — Telehealth (HOSPITAL_COMMUNITY): Payer: Self-pay

## 2015-11-05 NOTE — Telephone Encounter (Signed)
Left message for pt to return call. AW 

## 2015-11-25 DIAGNOSIS — I422 Other hypertrophic cardiomyopathy: Secondary | ICD-10-CM | POA: Diagnosis not present

## 2015-11-25 DIAGNOSIS — E669 Obesity, unspecified: Secondary | ICD-10-CM | POA: Diagnosis not present

## 2015-11-25 DIAGNOSIS — I4891 Unspecified atrial fibrillation: Secondary | ICD-10-CM | POA: Diagnosis not present

## 2015-11-25 DIAGNOSIS — I6930 Unspecified sequelae of cerebral infarction: Secondary | ICD-10-CM | POA: Diagnosis not present

## 2015-11-25 DIAGNOSIS — Z79899 Other long term (current) drug therapy: Secondary | ICD-10-CM | POA: Diagnosis not present

## 2015-11-25 DIAGNOSIS — I10 Essential (primary) hypertension: Secondary | ICD-10-CM | POA: Diagnosis not present

## 2015-11-25 DIAGNOSIS — D696 Thrombocytopenia, unspecified: Secondary | ICD-10-CM | POA: Diagnosis not present

## 2015-11-25 DIAGNOSIS — E7800 Pure hypercholesterolemia, unspecified: Secondary | ICD-10-CM | POA: Diagnosis not present

## 2015-11-25 DIAGNOSIS — G500 Trigeminal neuralgia: Secondary | ICD-10-CM | POA: Diagnosis not present

## 2015-11-25 DIAGNOSIS — Z0000 Encounter for general adult medical examination without abnormal findings: Secondary | ICD-10-CM | POA: Diagnosis not present

## 2015-11-26 DIAGNOSIS — I4891 Unspecified atrial fibrillation: Secondary | ICD-10-CM | POA: Diagnosis not present

## 2015-11-26 DIAGNOSIS — E7800 Pure hypercholesterolemia, unspecified: Secondary | ICD-10-CM | POA: Diagnosis not present

## 2015-11-26 DIAGNOSIS — E669 Obesity, unspecified: Secondary | ICD-10-CM | POA: Diagnosis not present

## 2015-11-26 DIAGNOSIS — Z79899 Other long term (current) drug therapy: Secondary | ICD-10-CM | POA: Diagnosis not present

## 2015-11-26 DIAGNOSIS — Z0000 Encounter for general adult medical examination without abnormal findings: Secondary | ICD-10-CM | POA: Diagnosis not present

## 2016-01-10 DIAGNOSIS — I422 Other hypertrophic cardiomyopathy: Secondary | ICD-10-CM | POA: Diagnosis not present

## 2016-01-10 DIAGNOSIS — Z79899 Other long term (current) drug therapy: Secondary | ICD-10-CM | POA: Diagnosis not present

## 2016-01-10 DIAGNOSIS — Z950 Presence of cardiac pacemaker: Secondary | ICD-10-CM | POA: Diagnosis not present

## 2016-01-10 DIAGNOSIS — E7801 Familial hypercholesterolemia: Secondary | ICD-10-CM | POA: Diagnosis not present

## 2016-01-10 DIAGNOSIS — Z7901 Long term (current) use of anticoagulants: Secondary | ICD-10-CM | POA: Diagnosis not present

## 2016-01-10 DIAGNOSIS — I480 Paroxysmal atrial fibrillation: Secondary | ICD-10-CM | POA: Diagnosis not present

## 2016-01-13 DIAGNOSIS — Z79899 Other long term (current) drug therapy: Secondary | ICD-10-CM | POA: Diagnosis not present

## 2016-01-13 DIAGNOSIS — I480 Paroxysmal atrial fibrillation: Secondary | ICD-10-CM | POA: Diagnosis not present

## 2016-01-13 DIAGNOSIS — Z950 Presence of cardiac pacemaker: Secondary | ICD-10-CM | POA: Diagnosis not present

## 2016-01-28 ENCOUNTER — Observation Stay (HOSPITAL_COMMUNITY)
Admission: EM | Admit: 2016-01-28 | Discharge: 2016-01-29 | Disposition: A | Discharge status: Home / Self Care | Payer: Medicare HMO | Attending: Cardiology | Admitting: Cardiology

## 2016-01-28 ENCOUNTER — Encounter (HOSPITAL_COMMUNITY): Payer: Self-pay | Admitting: Emergency Medicine

## 2016-01-28 DIAGNOSIS — Z79899 Other long term (current) drug therapy: Secondary | ICD-10-CM | POA: Insufficient documentation

## 2016-01-28 DIAGNOSIS — R002 Palpitations: Secondary | ICD-10-CM | POA: Diagnosis present

## 2016-01-28 DIAGNOSIS — R69 Illness, unspecified: Secondary | ICD-10-CM | POA: Diagnosis not present

## 2016-01-28 DIAGNOSIS — F17210 Nicotine dependence, cigarettes, uncomplicated: Secondary | ICD-10-CM | POA: Insufficient documentation

## 2016-01-28 DIAGNOSIS — I495 Sick sinus syndrome: Secondary | ICD-10-CM | POA: Diagnosis not present

## 2016-01-28 DIAGNOSIS — Z7902 Long term (current) use of antithrombotics/antiplatelets: Secondary | ICD-10-CM | POA: Insufficient documentation

## 2016-01-28 DIAGNOSIS — R748 Abnormal levels of other serum enzymes: Secondary | ICD-10-CM

## 2016-01-28 DIAGNOSIS — E785 Hyperlipidemia, unspecified: Secondary | ICD-10-CM | POA: Diagnosis not present

## 2016-01-28 DIAGNOSIS — I259 Chronic ischemic heart disease, unspecified: Secondary | ICD-10-CM | POA: Diagnosis not present

## 2016-01-28 DIAGNOSIS — I10 Essential (primary) hypertension: Secondary | ICD-10-CM | POA: Diagnosis not present

## 2016-01-28 DIAGNOSIS — I4891 Unspecified atrial fibrillation: Secondary | ICD-10-CM | POA: Diagnosis not present

## 2016-01-28 DIAGNOSIS — I421 Obstructive hypertrophic cardiomyopathy: Secondary | ICD-10-CM | POA: Diagnosis not present

## 2016-01-28 DIAGNOSIS — I2510 Atherosclerotic heart disease of native coronary artery without angina pectoris: Secondary | ICD-10-CM | POA: Insufficient documentation

## 2016-01-28 DIAGNOSIS — I248 Other forms of acute ischemic heart disease: Secondary | ICD-10-CM

## 2016-01-28 DIAGNOSIS — I482 Chronic atrial fibrillation, unspecified: Secondary | ICD-10-CM | POA: Diagnosis present

## 2016-01-28 DIAGNOSIS — Z955 Presence of coronary angioplasty implant and graft: Secondary | ICD-10-CM | POA: Diagnosis not present

## 2016-01-28 DIAGNOSIS — I69320 Aphasia following cerebral infarction: Secondary | ICD-10-CM | POA: Insufficient documentation

## 2016-01-28 DIAGNOSIS — Z950 Presence of cardiac pacemaker: Secondary | ICD-10-CM | POA: Insufficient documentation

## 2016-01-28 DIAGNOSIS — I480 Paroxysmal atrial fibrillation: Principal | ICD-10-CM | POA: Insufficient documentation

## 2016-01-28 DIAGNOSIS — Z7901 Long term (current) use of anticoagulants: Secondary | ICD-10-CM | POA: Diagnosis not present

## 2016-01-28 DIAGNOSIS — I69351 Hemiplegia and hemiparesis following cerebral infarction affecting right dominant side: Secondary | ICD-10-CM | POA: Insufficient documentation

## 2016-01-28 DIAGNOSIS — R200 Anesthesia of skin: Secondary | ICD-10-CM | POA: Diagnosis not present

## 2016-01-28 DIAGNOSIS — I6789 Other cerebrovascular disease: Secondary | ICD-10-CM | POA: Diagnosis not present

## 2016-01-28 LAB — I-STAT TROPONIN, ED
TROPONIN I, POC: 0.08 ng/mL (ref 0.00–0.08)
TROPONIN I, POC: 0.26 ng/mL — AB (ref 0.00–0.08)

## 2016-01-28 LAB — I-STAT CHEM 8, ED
BUN: 21 mg/dL — ABNORMAL HIGH (ref 6–20)
CHLORIDE: 107 mmol/L (ref 101–111)
Calcium, Ion: 1.16 mmol/L (ref 1.15–1.40)
Creatinine, Ser: 1.3 mg/dL — ABNORMAL HIGH (ref 0.61–1.24)
GLUCOSE: 105 mg/dL — AB (ref 65–99)
HEMATOCRIT: 43 % (ref 39.0–52.0)
Hemoglobin: 14.6 g/dL (ref 13.0–17.0)
POTASSIUM: 4.3 mmol/L (ref 3.5–5.1)
Sodium: 141 mmol/L (ref 135–145)
TCO2: 25 mmol/L (ref 0–100)

## 2016-01-28 MED ORDER — AMIODARONE HCL 200 MG PO TABS
200.0000 mg | ORAL_TABLET | Freq: Every day | ORAL | Status: DC
  Administered 2016-01-29: 200 mg via ORAL
  Filled 2016-01-28 (×2): qty 1

## 2016-01-28 MED ORDER — ADULT MULTIVITAMIN W/MINERALS CH
1.0000 | ORAL_TABLET | Freq: Every day | ORAL | Status: DC
  Administered 2016-01-29: 1 via ORAL
  Filled 2016-01-28 (×2): qty 1

## 2016-01-28 MED ORDER — ONDANSETRON HCL 4 MG/2ML IJ SOLN: 4.0000 mg | Freq: Four times a day (QID) | INTRAMUSCULAR | Status: DC | PRN

## 2016-01-28 MED ORDER — ROSUVASTATIN CALCIUM 10 MG PO TABS
40.0000 mg | ORAL_TABLET | Freq: Every day | ORAL | Status: DC
  Filled 2016-01-28: qty 4

## 2016-01-28 MED ORDER — METOPROLOL TARTRATE 12.5 MG HALF TABLET
12.5000 mg | ORAL_TABLET | Freq: Two times a day (BID) | ORAL | Status: DC
  Administered 2016-01-28 – 2016-01-29 (×2): 12.5 mg via ORAL
  Filled 2016-01-28 (×2): qty 1

## 2016-01-28 MED ORDER — PROPOFOL 10 MG/ML IV BOLUS
INTRAVENOUS | Status: DC | PRN
  Administered 2016-01-28: 40 mg via INTRAVENOUS

## 2016-01-28 MED ORDER — ACETAMINOPHEN 325 MG PO TABS: 650.0000 mg | ORAL_TABLET | ORAL | Status: DC | PRN

## 2016-01-28 MED ORDER — APIXABAN 5 MG PO TABS
5.0000 mg | ORAL_TABLET | Freq: Two times a day (BID) | ORAL | Status: DC
  Administered 2016-01-28 – 2016-01-29 (×2): 5 mg via ORAL
  Filled 2016-01-28 (×2): qty 1

## 2016-01-28 MED ORDER — MULTI-VITAMIN/MINERALS PO TABS: 1.0000 | ORAL_TABLET | Freq: Every day | ORAL | Status: DC

## 2016-01-28 MED ORDER — PANTOPRAZOLE SODIUM 40 MG PO TBEC
40.0000 mg | DELAYED_RELEASE_TABLET | Freq: Every day | ORAL | Status: DC
  Administered 2016-01-29: 40 mg via ORAL
  Filled 2016-01-28 (×2): qty 1

## 2016-01-28 MED ORDER — PROPOFOL 10 MG/ML IV BOLUS
INTRAVENOUS | Status: AC
Start: 2016-01-28 — End: 2016-01-28
  Filled 2016-01-28: qty 20

## 2016-01-28 MED ORDER — OMEGA-3-ACID ETHYL ESTERS 1 G PO CAPS
1.0000 g | ORAL_CAPSULE | Freq: Every day | ORAL | Status: DC
  Administered 2016-01-29: 1 g via ORAL
  Filled 2016-01-28 (×2): qty 1

## 2016-01-28 MED ORDER — CLOPIDOGREL BISULFATE 75 MG PO TABS
75.0000 mg | ORAL_TABLET | Freq: Every day | ORAL | Status: DC
  Administered 2016-01-29: 75 mg via ORAL
  Filled 2016-01-28: qty 1

## 2016-01-28 MED ORDER — PROPOFOL 10 MG/ML IV BOLUS: 80.0000 mg | Freq: Once | INTRAVENOUS | Status: DC

## 2016-01-28 NOTE — ED Notes (Signed)
EKG given to Dr. Knott 

## 2016-01-28 NOTE — ED Provider Notes (Signed)
[facility]-EM[facility]ENCY DEPT Provider Note   CSN: [id number] Arrival date & time: 01/28/16  1020     History   Chief Complaint Chief Complaint  Patient presents with  . Arm Pain    HPI Luis Robinson is a [age] y.o. male.  The history is provided by the patient.  Palpitations   This is a recurrent problem. Episode onset: this morning at 715 AM. The problem occurs constantly. The problem has not changed since onset.Associated with: waking this morning, at rest. Pertinent negatives include no syncope. He has tried nothing for the symptoms. Risk factors: CAD. Past medical history comments: atrial fibrillation.    Past Medical History:  Diagnosis Date  . CAD S/P percutaneous coronary angioplasty Aug 2016   Dx1 DES, no other significant dis  . Chronic anticoagulation    Eliquis  . Dizziness   . Dyspnea   . Hyperlipidemia   . Hypertension   . Hypertrophic cardiomyopathy (HCC)   . PAF (paroxysmal atrial fibrillation) (HCC)   . SSS (sick sinus syndrome) (HCC)   . Tobacco use disorder     Patient Active Problem List   Diagnosis Date Noted  . Labile blood pressure   . Bowel incontinence   . CVA (cerebral infarction)   . Aphasia due to stroke (HCC) 05/25/2015  . Right hemiparesis (HCC) 05/25/2015  . 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 SSS (sick sinus syndrome) (HCC) 05/22/2015  . Cardiac pacemaker in situ-MDT- implanted 2008 05/22/2015  . Chronic anticoagulation-Eliquis prior to adm 05/22/2015  . Atrial fibrillation with RVR (HCC) 05/22/2015  . CAD- S/P Dx1 DES Aug 2016 05/22/2015  . Dyslipidemia 05/22/2015  . Prediabetes   . Tobacco abuse   . Paroxysmal atrial fibrillation (HCC)   . Benign essential HTN   . History of back surgery   . Acute blood loss anemia   . Hypertrophic obstructive cardiomyopathy (HCC)   . Tachypnea   . Leukocytosis   . Thrombocytopenia (HCC)   . CVA due to occlusion of cerebral artery- treated with LMCA stent 05/20/15   . History of  ETT   . Respiratory failure with hypoxia (HCC)   . Stroke (HCC)   . Stroke (cerebrum) (HCC) 05/20/2015    Past Surgical History:  Procedure Laterality Date  . [facility]  [facility]    . INSERT / REPLACE / REMOVE PACEMAKER  2008   MDT  . RADIOLOGY WITH ANESTHESIA N/A 05/20/2015   Procedure: RADIOLOGY WITH ANESTHESIA;  Surgeon: Medication Radiologist, MD;  Location: [facility] OR;  Service: Radiology;  Laterality: N/A;  . rotator cuff surgery         Home Medications    Prior to Admission medications   Medication Sig Start Date End Date Taking? Authorizing Provider  amiodarone (PACERONE) 200 MG tablet 200 mg twice daily until 06/11/2015 and then  begin one tab daily 06/05/15   Mcarthur Rossettianiel J Angiulli, PA-C  apixaban (ELIQUIS) 5 MG TABS tablet Take 1 tablet (5 mg total) by mouth 2 (two) times daily. 06/05/15   Mcarthur Rossettianiel J Angiulli, PA-C  clopidogrel (PLAVIX) 75 MG tablet Take 1 tablet (75 mg total) by mouth daily with breakfast. 06/05/15   Mcarthur Rossettianiel J Angiulli, PA-C  furosemide (LASIX) 20 MG tablet Take 1 tablet (20 mg total) by mouth daily. Patient not taking: Reported on 07/04/2015 06/05/15   Mcarthur Rossettianiel J Angiulli, PA-C  metoprolol tartrate (LOPRESSOR) 25 MG tablet Take 0.5 tablets (12.5 mg total) by mouth  2 (two) times daily. 06/05/15   Mcarthur Rossetti Angiulli, PA-C  pantoprazole (PROTONIX) 40 MG tablet Take 1 tablet (40 mg total) by mouth daily. 06/05/15   Mcarthur Rossetti Angiulli, PA-C  rosuvastatin (CRESTOR) 20 MG tablet Take 1 tablet (20 mg total) by mouth daily. 06/05/15   Mcarthur Rossetti Angiulli, PA-C    Family History Family History  Problem Relation Age of Onset  . Cancer Sister 58    Social History Social History  Substance Use Topics  . Smoking status: Current Every Day Smoker    Packs/day: 0.50    Types: Cigarettes  . Smokeless tobacco: Never Used  . Alcohol use No     Allergies   Bee venom   Review of Systems Review of Systems  Cardiovascular: Positive for palpitations.  Negative for syncope.  All other systems reviewed and are negative.    Physical Exam Updated Vital Signs There were no vitals taken for this visit.  Physical Exam  Constitutional: He is oriented to person, place, and time. He appears well-developed and well-nourished. No distress.  HENT:  Head: Normocephalic and atraumatic.  Nose: Nose normal.  Eyes: Conjunctivae are normal.  Neck: Neck supple. No tracheal deviation present.  Cardiovascular: Normal heart sounds.  An irregularly irregular rhythm present. Tachycardia present.   Pulmonary/Chest: Effort normal and breath sounds normal. No respiratory distress.  Abdominal: Soft. He exhibits no distension.  Neurological: He is alert and oriented to person, place, and time.  Skin: Skin is warm and dry.  Psychiatric: He has a normal mood and affect.     ED Treatments / Results  Labs (all labs ordered are listed, but only abnormal results are displayed) Labs Reviewed - No data to display  EKG  EKG Interpretation  Date/Time:  Tuesday January 28 2016 10:28:44 EST Ventricular Rate:  164 PR Interval:    QRS Duration: 115 QT Interval:  275 QTC Calculation: 455 R Axis:   -17 Text Interpretation:  Atrial fibrillation with rapid V-rate LVH with secondary repolarization abnormality Anteroseptal infarct, possibly acute Confirmed by Branae Crail MD, Consuello Lassalle [phone]) on 01/28/2016 10:31:41 AM       EKG Interpretation  Date/Time:  Tuesday January 28 2016 11:06:14 EST Ventricular Rate:  63 PR Interval:    QRS Duration: 108 QT Interval:  481 QTC Calculation: 493 R Axis:   49 Text Interpretation:  Atrial-paced rhythm nonspecific repolarization abnormality Diffuse leads New since previous tracing Confirmed by Keidra Withers MD, Derriana Oser [phone]) on 01/28/2016 1:11:54 PM       EKG Interpretation  Date/Time:  Tuesday January 28 2016 14:51:22 EST Ventricular Rate:  66 PR Interval:    QRS Duration: 98 QT Interval:  459 QTC Calculation: 481 R  Axis:   58 Text Interpretation:  Sinus rhythm repolarization abnormality, diffuse leads worsening in V3 from prior Confirmed by Estelita Iten MD, Licet Dunphy ([id number]) on 01/28/2016 6:53:21 PM        Radiology No results found.  Procedures  CRITICAL CARE Performed by: Lyndal Pulley Total critical care time: 30 minutes Critical care time was exclusive of separately billable procedures and treating other patients. Critical care was necessary to treat or prevent imminent or life-threatening deterioration. Critical care was time spent personally by me on the following activities: development of treatment plan with patient and/or surrogate as well as nursing, discussions with consultants, evaluation of patient's response to treatment, examination of patient, obtaining history from patient or surrogate, ordering and performing treatments and interventions, ordering and review of laboratory studies, ordering and review of  radiographic studies, pulse oximetry and re-evaluation of patient's condition.   .Cardioversion Date/Time: 01/28/2016 3:57 PM Performed by: Lyndal Pulley Authorized by: Lyndal Pulley   Consent:    Consent obtained:  Written   Consent given by:  Patient   Risks discussed:  Cutaneous burn, induced arrhythmia and pain   Alternatives discussed:  Delayed treatment, rate-control medication and observation Pre-procedure details:    Cardioversion basis:  Elective   Rhythm:  Atrial fibrillation   Electrode placement:  Anterior-posterior Attempt one:    Cardioversion mode:  Synchronous   Waveform:  Biphasic   Shock (Joules):  150   Shock outcome:  Conversion to other rhythm (paced rhythm) Post-procedure details:    Patient status:  Awake   Patient tolerance of procedure:  Tolerated well, no immediate complications   (including critical care time)  Procedural sedation Performed by: Lyndal Pulley Consent: Verbal consent obtained. Risks and benefits: risks, benefits and alternatives were  discussed Required items: required blood products, implants, devices, and special equipment available Patient identity confirmed: arm band and provided demographic data Time out: Immediately prior to procedure a "time out" was called to verify the correct patient, procedure, equipment, support staff and site/side marked as required.  Sedation type: moderate (conscious) sedation NPO time confirmed and considedered  Sedatives: PROPOFOL  Physician Time at Bedside: 20  Vitals: Vital signs were monitored during sedation. Cardiac Monitor, pulse oximeter Patient tolerance: Patient tolerated the procedure well with no immediate complications. Comments: Pt with uneventful recovered. Returned to pre-procedural sedation baseline   Medications Ordered in ED Medications  propofol (DIPRIVAN) 10 mg/mL bolus/IV push 80 mg (not administered)  propofol (DIPRIVAN) 10 mg/mL bolus/IV push (not administered)  propofol (DIPRIVAN) 10 mg/mL bolus/IV push (40 mg Intravenous Given 01/28/16 1055)     Initial Impression / Assessment and Plan / ED Course  I have reviewed the triage vital signs and the nursing notes.  Pertinent labs & imaging results that were available during my care of the patient were reviewed by me and considered in my medical decision making (see chart for details).  Clinical Course    CHA2DS2/VAS Stroke Risk Points      4 >= 2 Points: High Risk  1 - 1.99 Points: Medium Risk  0 Points: Low Risk    The previous score was 3 on 09/20/2015.:  Change:         Details    Note: External data might be a factor in metrics not marked with    Points Metrics   This score determines the patient's risk of having a stroke if the  patient has atrial fibrillation.       0 Has Congestive Heart Failure:  No   1 Has Vascular Disease:  Yes   1 Has Hypertension:  Yes   0 Age:  8   0 Has Diabetes:  No   2 Had Stroke:  Yes Had TIA:  No Had thromboembolism:  No   0 Male:  No   75 year old male  presents with bilateral arm tingling, lightheadedness, and palpitations that was present upon waking this morning. He is status post recent ablation and pacemaker placement and is seen by [facility] cardiology in [facility]. ST and T-wave changes are noted on his EKG that are consistent with rate related demand. I discussed the risks and benefits of elective cardioversion given his recent onset and ongoing anticoagulation on eliquis. He agreed to go forward with cardioversion. A single attempt under propofol sedation converted him into  an atrial paced rhythm. After the procedure the patient continued to have no chest pain and was feeling better from his other symptoms. His EKG continued to show deep T-wave inversions and diffuse leads concerning for ongoing ischemia. Troponin was initially drawn which was negative. I discussed the patient with Dr. Delton SeeNelson of cardiology regarding this finding and she recommended repeat troponin to make sure that it was not increasing. Third EKG shows essentially unchanged T-wave inversion and depression and troponin is up trending. Cardiology agrees to admit the patient for ACS rule out.  Final Clinical Impressions(s) / ED Diagnoses   Final diagnoses:  Paroxysmal atrial fibrillation with rapid ventricular response (HCC)  Demand ischemia of myocardium [facility](HCC)    New Prescriptions New Prescriptions   No medications on file     Lyndal Pulleyaniel Marcy Sookdeo, MD 01/28/16 [phone]

## 2016-01-28 NOTE — ED Notes (Signed)
Report attempted to floor.   

## 2016-01-28 NOTE — H&P (Signed)
Cardiology H&P    Patient ID: Luis Robinson MRN: [id number], DOB/AGE: 03-21-1940   Admit date: 01/28/2016 Date of Consult: 01/28/2016  Primary Physician: No primary care provider on file.  Primary Cardiologist: Dr. Rudolpho Robinson in [facility]   Patient Profile    [name] was admitted on 01/28/16 with rapid atrial fib. He was cardioverted in the ED with successful return to NSR/Atrial paced rhythm. Troponin was elevated at 0.08>>0.26.   History of Present Illness    [name] was lying in bed this morning around 7:15 and developed palpitations. He felt his heart racing, but denies associated symptoms of SOB or chest pain. He had some bilateral arm numbness and pain.   His EKG showed atrial fibrillation with rapid ventricular response with rate of 164. There was some ST depression and repolarization abnormalities. He was cardioverted in the ED with successful return to an atrial paced rhythm.   Troponin was 0.08>>0.26. He is chest pain free. He has a history of Afib and is on Amiodarone and Eliquis for anticoagulation. He has had a CVA in the past and is on Plavix.   Past Medical History   Past Medical History:  Diagnosis Date  . CAD S/P percutaneous coronary angioplasty Aug 2016   Dx1 DES, no other significant dis  . Chronic anticoagulation    Eliquis  . Dizziness   . Dyspnea   . Hyperlipidemia   . Hypertension   . Hypertrophic cardiomyopathy (HCC)   . PAF (paroxysmal atrial fibrillation) (HCC)   . SSS (sick sinus syndrome) (HCC)   . Tobacco use disorder     Past Surgical History:  Procedure Laterality Date  . [facility]  [facility]    . INSERT / REPLACE / REMOVE PACEMAKER  2008   MDT  . RADIOLOGY WITH ANESTHESIA N/A 05/20/2015   Procedure: RADIOLOGY WITH ANESTHESIA;  Surgeon: Medication Radiologist, MD;  Location: [facility] OR;  Service: Radiology;  Laterality: N/A;  . rotator cuff surgery       Allergies  Allergies  Allergen Reactions  . Bee  Venom Anaphylaxis    Inpatient Medications    . propofol  80 mg Intravenous Once  . propofol        Family History    Family History  Problem Relation Age of Onset  . Cancer Sister [id number]    Social History    Social History   Social History  . Marital status: Widowed    Spouse name: N/A  . Number of children: N/A  . Years of education: N/A   Occupational History  . Not on file.   Social History Main Topics  . Smoking status: Current Every Day Smoker    Packs/day: 0.50    Types: Cigarettes  . Smokeless tobacco: Never Used  . Alcohol use No  . Drug use: No  . Sexual activity: Not on file   Other Topics Concern  . Not on file   Social History Narrative  . No narrative on file     Review of Systems    General:  No chills, fever, night sweats or weight changes.  Cardiovascular:  No chest pain, dyspnea on exertion, edema, orthopnea, palpitations, paroxysmal nocturnal dyspnea. Dermatological: No rash, lesions/masses Respiratory: No cough, dyspnea Urologic: No hematuria, dysuria Abdominal:   No nausea, vomiting, diarrhea, bright red blood per rectum, melena, or hematemesis Neurologic:  No visual changes, wkns, changes in mental status. All other systems reviewed and  are otherwise negative except as noted above.  Physical Exam    Blood pressure 168/88, pulse 76, resp. rate 18, SpO2 97 %.  General: Pleasant, NAD Psych: Normal affect. Neuro: Alert and oriented X 3. Moves all extremities spontaneously. HEENT: Normal  Neck: Supple without bruits or JVD. Lungs:  Resp regular and unlabored, CTA. Heart: RRR no s3, s4, or murmurs. Abdomen: Soft, non-tender, non-distended, BS + x 4.  Extremities: No clubbing, cyanosis or edema. DP/PT/Radials 2+ and equal bilaterally.  Labs    Troponin [facility](Point of Care Test)  Recent Labs  01/28/16 1527  TROPIPOC 0.26*    Lab Results  Component Value Date   WBC 8.9 05/27/2015   HGB 14.6 01/28/2016   HCT 43.0 01/28/2016   MCV  93.6 05/27/2015   PLT 155 05/27/2015    Recent Labs Lab 01/28/16 1304  NA 141  K 4.3  CL 107  BUN 21*  CREATININE 1.30*  GLUCOSE 105*   Lab Results  Component Value Date   CHOL 84 05/21/2015   HDL 29 (L) 05/21/2015   LDLCALC 43 05/21/2015   TRIG 59 05/21/2015   No results found for: [facility]   Radiology Studies    No results found.  EKG & Cardiac Imaging    EKG: atrial paced    Assessment & Plan    1. Atrial fibrillation with RVR: with history of Afib, on Amiodarone and Eliquis for anticoagulation. Now atrial paced. Last Echo was in March 2017 and he had normal LV function. His left atrium was severely dilated.   This patients CHA2DS2-VASc Score and unadjusted Ischemic Stroke Rate (% per year) is equal to 4.8 % stroke rate/year from a score of 4 Above score calculated as 1 point each if present [CHF, HTN, DM, Vascular=MI/PAD/Aortic Plaque, Age if 65-74, or Male], 2 points each if present [Age > 75, or Stroke/TIA/TE]  Continue Eliquis.   2. Elevated troponin: Related to rapid afib, not representative of ACS. No need for an ischemic eval.   Signed, Luis IshikawaErin E Smith, NP 01/28/2016, 3:58 PM Pager: [phone]  The patient was seen, examined and discussed with Luis IshikawaErin E Smith, NP and I agree with the above.   [name] with h/o CAD, s/p PCI/DES in 2016, PAF on chronic anticoagulation with Xarelto, the last episode in March 2016 who presented to the ER with rapid atrial fib. He was cardioverted in the ED with successful return to NSR/Atrial paced rhythm. Troponin was elevated at 0.08>>0.26. Patients baseline ECG shows SR, STD in the lateral leads, post cardioversion more profound and now in the inferolateral leads.   We will observe overnight, cycle troponin, no ischemia workup unless significant troponin elevation.  He is on Eliquis for anticoagulation. He has had a CVA in the past and is on Plavix.   Luis AlexanderKatarina Jarrius Huaracha, MD 01/28/2016

## 2016-01-28 NOTE — ED Triage Notes (Signed)
Per EMS: pt sts some bilateral arm numbness and pain; pt with hx of stroke in March; pt noted to have irregular HR and tachy; pt with hx of afib; pt denies any sx at present; pt told last week needed to have issue resolved with pacemaker; 20g R FA

## 2016-01-28 NOTE — ED Notes (Addendum)
Provided pt with meal and water

## 2016-01-29 ENCOUNTER — Other Ambulatory Visit: Payer: Self-pay | Admitting: Cardiology

## 2016-01-29 DIAGNOSIS — I2510 Atherosclerotic heart disease of native coronary artery without angina pectoris: Secondary | ICD-10-CM | POA: Diagnosis not present

## 2016-01-29 DIAGNOSIS — I421 Obstructive hypertrophic cardiomyopathy: Secondary | ICD-10-CM | POA: Diagnosis not present

## 2016-01-29 DIAGNOSIS — R748 Abnormal levels of other serum enzymes: Secondary | ICD-10-CM | POA: Diagnosis not present

## 2016-01-29 DIAGNOSIS — Z7901 Long term (current) use of anticoagulants: Secondary | ICD-10-CM

## 2016-01-29 DIAGNOSIS — I69320 Aphasia following cerebral infarction: Secondary | ICD-10-CM | POA: Diagnosis not present

## 2016-01-29 DIAGNOSIS — R69 Illness, unspecified: Secondary | ICD-10-CM | POA: Diagnosis not present

## 2016-01-29 DIAGNOSIS — I69351 Hemiplegia and hemiparesis following cerebral infarction affecting right dominant side: Secondary | ICD-10-CM | POA: Diagnosis not present

## 2016-01-29 DIAGNOSIS — I4891 Unspecified atrial fibrillation: Secondary | ICD-10-CM | POA: Diagnosis not present

## 2016-01-29 DIAGNOSIS — I10 Essential (primary) hypertension: Secondary | ICD-10-CM | POA: Diagnosis not present

## 2016-01-29 DIAGNOSIS — R7989 Other specified abnormal findings of blood chemistry: Principal | ICD-10-CM

## 2016-01-29 DIAGNOSIS — I495 Sick sinus syndrome: Secondary | ICD-10-CM | POA: Diagnosis not present

## 2016-01-29 DIAGNOSIS — R778 Other specified abnormalities of plasma proteins: Secondary | ICD-10-CM

## 2016-01-29 DIAGNOSIS — I480 Paroxysmal atrial fibrillation: Secondary | ICD-10-CM | POA: Diagnosis not present

## 2016-01-29 DIAGNOSIS — E785 Hyperlipidemia, unspecified: Secondary | ICD-10-CM | POA: Diagnosis not present

## 2016-01-29 DIAGNOSIS — Z7902 Long term (current) use of antithrombotics/antiplatelets: Secondary | ICD-10-CM | POA: Diagnosis not present

## 2016-01-29 LAB — TROPONIN I
Troponin I: 0.46 ng/mL (ref <0.03)
Troponin I: 0.4 ng/mL (ref <0.03)
Troponin I: 0.25 ng/mL (ref <0.03)

## 2016-01-29 LAB — BASIC METABOLIC PANEL
Anion gap: 7 (ref 5–15)
BUN: 17 mg/dL (ref 6–20)
CHLORIDE: 106 mmol/L (ref 101–111)
CO2: 28 mmol/L (ref 22–32)
CREATININE: 1.24 mg/dL (ref 0.61–1.24)
Calcium: 9.1 mg/dL (ref 8.9–10.3)
GFR calc Af Amer: >60 mL/min (ref >60)
GFR calc non Af Amer: 56 mL/min — ABNORMAL LOW (ref >60)
GLUCOSE: 84 mg/dL (ref 65–99)
POTASSIUM: 4.2 mmol/L (ref 3.5–5.1)
Sodium: 141 mmol/L (ref 135–145)

## 2016-01-29 LAB — CBC
HEMATOCRIT: 40.8 % (ref 39.0–52.0)
Hemoglobin: 13.4 g/dL (ref 13.0–17.0)
MCH: 30.3 pg (ref 26.0–34.0)
MCHC: 32.8 g/dL (ref 30.0–36.0)
MCV: 92.3 fL (ref 78.0–100.0)
PLATELETS: 165 10*3/uL (ref 150–400)
RBC: 4.42 MIL/uL (ref 4.22–5.81)
RDW: 13.3 % (ref 11.5–15.5)
WBC: 9.1 10*3/uL (ref 4.0–10.5)

## 2016-01-29 LAB — GLUCOSE, CAPILLARY: Glucose-Capillary: 87 mg/dL (ref 65–99)

## 2016-01-29 NOTE — Care Management Obs Status (Signed)
MEDICARE OBSERVATION STATUS NOTIFICATION   Patient Details  Name: Luis Robinson MRN: [id number] Date of Birth: Sep 06, 1940   Medicare Observation Status Notification Given:  Yes    CrutchfieldDerrill Memo, Juell Radney M, RN 01/29/2016, 1:55 PM

## 2016-01-29 NOTE — Discharge Summary (Signed)
Discharge Summary    Patient ID: Luis Robinson,  MRN: [id number], DOB/AGE: [birth year] 75 y.o.  Admit date: 01/28/2016 Discharge date: 01/29/2016  Primary Care Provider: No primary care provider on file. Primary Cardiologist: Dr. Rudolpho SevinAkbary (Dr. Delton SeeNelson)  Discharge Diagnoses    Active Problems:   Atrial fibrillation with RVR (HCC)   Allergies Allergies  Allergen Reactions  . Bee Venom Anaphylaxis    Diagnostic Studies/Procedures    None _____________   History of Present Illness     75 yo male with PMH of HTN, HLD, PAF, CAD s/p DES who presented with sudden onset of palpitations on the morning of 12/5 that started at 7:15am. He felt his heart racing, but denies associated symptoms of SOB or chest pain. He had some bilateral arm numbness and pain.   His EKG showed atrial fibrillation with rapid ventricular response with rate of 164. There was some ST depression and repolarization abnormalities. He was cardioverted in the ED with successful return to an atrial paced rhythm.   Troponin was 0.08>>0.26. He is chest pain free. He has a history of Afib and is on Amiodarone and Eliquis for anticoagulation. He has had a CVA in the past and is on Plavix.     Hospital Course     Consultants: None  Given this mild elevation in trop he was held and observed overnight. He was continued on his home medications, including Eliquis and metoprolol 12.5mg  BID.   The following day he reported feeling well, no further complaints. He remained in SR, noted minimal trop elevation with downward trend. He was seen by Dr. Delton SeeNelson and determined stable for discharge home. I have arranged for outpatient stress testing, and follow up in the office.  _____________  Discharge Vitals Blood pressure 131/74, pulse 60, temperature 98.1 F (36.7 C), temperature source Oral, resp. rate 18, height 5\' 11"  (1.803 m), weight 204 lb 11.2 oz (92.9 kg), SpO2 95 %.  Filed Weights   01/28/16 2009  Weight: 204 lb  11.2 oz (92.9 kg)    Labs & Radiologic Studies    CBC  Recent Labs  01/28/16 1304 01/29/16 0449  WBC  --  9.1  HGB 14.6 13.4  HCT 43.0 40.8  MCV  --  92.3  PLT  --  165   Basic Metabolic Panel  Recent Labs  01/28/16 1304 01/29/16 0449  NA 141 141  K 4.3 4.2  CL 107 106  CO2  --  28  GLUCOSE 105* 84  BUN 21* 17  CREATININE 1.30* 1.24  CALCIUM  --  9.1   Liver Function Tests No results for input(s): AST, ALT, ALKPHOS, BILITOT, PROT, ALBUMIN in the last 72 hours. No results for input(s): LIPASE, AMYLASE in the last 72 hours. Cardiac Enzymes  Recent Labs  01/28/16 2310 01/29/16 0449 01/29/16 1130  TROPONINI 0.46* 0.40* 0.25*   BNP Invalid input(s): POCBNP D-Dimer No results for input(s): DDIMER in the last 72 hours. Hemoglobin A1C No results for input(s): HGBA1C in the last 72 hours. Fasting Lipid Panel No results for input(s): CHOL, HDL, LDLCALC, TRIG, CHOLHDL, LDLDIRECT in the last 72 hours. Thyroid Function Tests No results for input(s): TSH, T4TOTAL, T3FREE, THYROIDAB in the last 72 hours.  Invalid input(s): FREET3 _____________  No results found. Disposition   Pt is being discharged home today in good condition.  Follow-up Plans & Appointments    Follow-up Information    Schedule an appointment as soon as possible for a visit with  STRAG, BENJAMIN W, PA.   Specialty:  Cardiology Why:  for re-evaluation Contact information: [address] [place] [address] [phone]        Jacolyn ReedyMichele Lenze, PA-C Follow up on 02/10/2016.   Specialty:  Cardiology Why:  at 10:30 for your follow up appt.  Contact information: [address] [place] [address] [phone]        [facility] Follow up on 01/31/2016.   Why:  at 7:15am for your outpatient stress test. Please nothing to eat or drink after midnight the night before.  Contact information: [address] [place] [phone]         Discharge Instructions    Diet - low sodium heart healthy    Complete by:  As directed    Increase activity slowly    Complete by:  As directed       Discharge Medications   Current Discharge Medication List    CONTINUE these medications which have NOT CHANGED   Details  amiodarone (PACERONE) 200 MG tablet 200 mg twice daily until 06/11/2015 and then  begin one tab daily Qty: 90 tablet, Refills: 1    apixaban (ELIQUIS) 5 MG TABS tablet Take 1 tablet (5 mg total) by mouth 2 (two) times daily. Qty: 60 tablet, Refills: 1    clopidogrel (PLAVIX) 75 MG tablet Take 1 tablet (75 mg total) by mouth daily with breakfast. Qty: 30 tablet, Refills: 1    metoprolol tartrate (LOPRESSOR) 25 MG tablet Take 0.5 tablets (12.5 mg total) by mouth 2 (two) times daily. Qty: 30 tablet, Refills: 1    Multiple Vitamins-Minerals (MULTIVITAMIN WITH MINERALS) tablet Take 1 tablet by mouth daily.    Omega-3 Fatty Acids (FISH OIL) 1000 MG CPDR Take 1,000 mg by mouth daily.    rosuvastatin (CRESTOR) 40 MG tablet Take 40 mg by mouth daily.    furosemide (LASIX) 20 MG tablet Take 1 tablet (20 mg total) by mouth daily. Qty: 30 tablet, Refills: 1    pantoprazole (PROTONIX) 40 MG tablet Take 1 tablet (40 mg total) by mouth daily. Qty: 30 tablet, Refills: 1          Outstanding Labs/Studies   None  Duration of Discharge Encounter   Greater than 30 minutes including physician time.  Signed, Laverda PageLindsay Maleya Leever NP-C 01/29/2016, 12:45 PM

## 2016-01-29 NOTE — Progress Notes (Signed)
Luis Robinson to be D/C'd Home per MD order. Discussed with the patient and all questions fully answered.    Medication List    TAKE these medications   amiodarone 200 MG tablet Commonly known as:  PACERONE 200 mg twice daily until 06/11/2015 and then  begin one tab daily What changed:  how much to take  how to take this  when to take this  additional instructions   apixaban 5 MG Tabs tablet Commonly known as:  ELIQUIS Take 1 tablet (5 mg total) by mouth 2 (two) times daily.   clopidogrel 75 MG tablet Commonly known as:  PLAVIX Take 1 tablet (75 mg total) by mouth daily with breakfast.   Fish Oil 1000 MG Cpdr Take 1,000 mg by mouth daily.   furosemide 20 MG tablet Commonly known as:  LASIX Take 1 tablet (20 mg total) by mouth daily.   metoprolol tartrate 25 MG tablet Commonly known as:  LOPRESSOR Take 0.5 tablets (12.5 mg total) by mouth 2 (two) times daily.   multivitamin with minerals tablet Take 1 tablet by mouth daily.   pantoprazole 40 MG tablet Commonly known as:  PROTONIX Take 1 tablet (40 mg total) by mouth daily.   rosuvastatin 40 MG tablet Commonly known as:  CRESTOR Take 40 mg by mouth daily. What changed:  Another medication with the same name was removed. Continue taking this medication, and follow the directions you see here.       VVS, Skin clean, dry and intact without evidence of skin break down, no evidence of skin tears noted.  IV catheter discontinued intact. Site without signs and symptoms of complications. Dressing and pressure applied.  An After Visit Summary was printed and given to the patient.  Patient escorted via WC, and D/C home via private auto.  Luis Robinson, Luis Robinson  01/29/2016 4:45 PM

## 2016-01-29 NOTE — Progress Notes (Signed)
Patient Name: Luis SoloKenneth Azzara Date of Encounter: 01/29/2016  Active Problems:   Atrial fibrillation with RVR (HCC)   Length of Stay: 0  SUBJECTIVE  The patient denies chest pain or SOB.   CURRENT MEDS . amiodarone  200 mg Oral Daily  . apixaban  5 mg Oral BID  . clopidogrel  75 mg Oral Q breakfast  . metoprolol tartrate  12.5 mg Oral BID  . multivitamin with minerals  1 tablet Oral Daily  . omega-3 acid ethyl esters  1 g Oral Daily  . pantoprazole  40 mg Oral Daily  . rosuvastatin  40 mg Oral q1800    OBJECTIVE  Vitals:   01/28/16 1930 01/28/16 1945 01/28/16 2009 01/29/16 0501  BP: 155/87 147/82 (!) 167/89 131/74  Pulse: 66 62 71 60  Resp: 18 19 20 18   Temp:   98.3 F (36.8 C) 98.1 F (36.7 C)  TempSrc:   Oral Oral  SpO2: 99% 99% 100% 95%  Weight:   204 lb 11.2 oz (92.9 kg)   Height:   5\' 11"  (1.803 m)     Intake/Output Summary (Last 24 hours) at 01/29/16 1010 Last data filed at 01/29/16 0501  Gross per 24 hour  Intake                0 ml  Output              240 ml  Net             -240 ml   Filed Weights   01/28/16 2009  Weight: 204 lb 11.2 oz (92.9 kg)    PHYSICAL EXAM  General: Pleasant, NAD. Neuro: Alert and oriented X 3. Moves all extremities spontaneously. Psych: Normal affect. HEENT:  Normal  Neck: Supple without bruits or JVD. Lungs:  Resp regular and unlabored, CTA. Heart: RRR no s3, s4, 2/6 systolic murmur at the RLSB Abdomen: Soft, non-tender, non-distended, BS + x 4.  Extremities: No clubbing, cyanosis or edema. DP/PT/Radials 2+ and equal bilaterally.  Accessory Clinical Findings  CBC  Recent Labs  01/28/16 1304 01/29/16 0449  WBC  --  9.1  HGB 14.6 13.4  HCT 43.0 40.8  MCV  --  92.3  PLT  --  165   Basic Metabolic Panel  Recent Labs  01/28/16 1304 01/29/16 0449  NA 141 141  K 4.3 4.2  CL 107 106  CO2  --  28  GLUCOSE 105* 84  BUN 21* 17  CREATININE 1.30* 1.24  CALCIUM  --  9.1    Recent Labs   01/28/16 2310 01/29/16 0449  TROPONINI 0.46* 0.40*   TELE: SR    ASSESSMENT AND PLAN  1. Atrial fibrillation with RVR 2. Elevated troponin   Mr. Elvis CoilJalbert is a 75 yo male with h/o CAD, s/p PCI/DES in 2016, PAF on chronic anticoagulation with Eliquis, CHADS-VASc 4, the last episode in March 2016 who presented to the ER with rapid atrial fib. He was cardioverted in the ED with successful return to NSR/Atrial paced rhythm. Troponin was elevated at 0.08>>0.26. Patients baseline ECG shows SR, STD in the lateral leads, post cardioversion more profound and now in the inferolateral leads.  He is asymptomatic, and remains in SR, he had minimal troponin elevation now downtrending.We will plan for an outpatient Lexiscan nuclear stress test (he is being paced) as he has h/p CAD, prior stent placement and no stress test in > 5 years. We will arrange for an outpatient follow  up after the stress test.   Signed, Tobias AlexanderKatarina Intisar Claudio MD, [facility] 01/29/2016

## 2016-01-29 NOTE — Progress Notes (Signed)
CRITICAL VALUE ALERT  Critical value received: Troponin 0.46  Date of notification:  01/29/16  Time of notification:  00:43  Critical value read back:Yes  Nurse who received alert:  Romero Letizia  MD notified (1st page):  Turner  Time of first page: 00:47  MD notified (2nd page): Pending  Time of second page: Pending  Responding MD:  Pending  Time MD responded:  Pending

## 2016-01-29 NOTE — Progress Notes (Signed)
Received return call from Dr. Mayford Knifeurner regarding elevated troponin 0.46, no new orders at this time will continue to monitor patient.

## 2016-01-31 ENCOUNTER — Encounter (HOSPITAL_COMMUNITY): Payer: Medicare HMO

## 2016-02-04 ENCOUNTER — Telehealth (HOSPITAL_COMMUNITY): Payer: Self-pay | Admitting: *Deleted

## 2016-02-04 NOTE — Telephone Encounter (Signed)
Left message on voicemail in reference to upcoming appointment scheduled for 02/07/16. Phone number given for a call back so details instructions can be given. Bosco Paparella W   

## 2016-02-05 NOTE — Telephone Encounter (Signed)
Calling in reference to appt this coming Friday

## 2016-02-07 ENCOUNTER — Ambulatory Visit (HOSPITAL_COMMUNITY): Payer: Medicare HMO | Attending: Cardiology

## 2016-02-07 DIAGNOSIS — R778 Other specified abnormalities of plasma proteins: Secondary | ICD-10-CM

## 2016-02-07 DIAGNOSIS — R7989 Other specified abnormal findings of blood chemistry: Secondary | ICD-10-CM

## 2016-02-07 DIAGNOSIS — R748 Abnormal levels of other serum enzymes: Secondary | ICD-10-CM | POA: Diagnosis not present

## 2016-02-07 LAB — MYOCARDIAL PERFUSION IMAGING
CHL CUP NUCLEAR SSS: 6
LHR: 0.19
LV sys vol: 57 mL
LVDIAVOL: 125 mL (ref 62–150)
Peak HR: 82 {beats}/min
Rest HR: 75 {beats}/min
SDS: 0
SRS: 6
TID: 1

## 2016-02-07 MED ORDER — TECHNETIUM TC 99M TETROFOSMIN IV KIT
10.1000 | PACK | Freq: Once | INTRAVENOUS | Status: AC | PRN
  Administered 2016-02-07: 10.1 via INTRAVENOUS
  Filled 2016-02-07: qty 11

## 2016-02-07 MED ORDER — REGADENOSON 0.4 MG/5ML IV SOLN
0.4000 mg | Freq: Once | INTRAVENOUS | Status: AC
  Administered 2016-02-07: 0.4 mg via INTRAVENOUS

## 2016-02-07 MED ORDER — TECHNETIUM TC 99M TETROFOSMIN IV KIT
32.3000 | PACK | Freq: Once | INTRAVENOUS | Status: AC | PRN
  Administered 2016-02-07: 32.3 via INTRAVENOUS
  Filled 2016-02-07: qty 33

## 2016-02-10 ENCOUNTER — Ambulatory Visit: Payer: Medicare HMO | Admitting: Physician Assistant

## 2016-02-25 DIAGNOSIS — I4891 Unspecified atrial fibrillation: Secondary | ICD-10-CM | POA: Diagnosis not present

## 2016-02-25 DIAGNOSIS — I10 Essential (primary) hypertension: Secondary | ICD-10-CM | POA: Diagnosis not present

## 2016-02-25 DIAGNOSIS — E7800 Pure hypercholesterolemia, unspecified: Secondary | ICD-10-CM | POA: Diagnosis not present

## 2016-02-25 DIAGNOSIS — Z79899 Other long term (current) drug therapy: Secondary | ICD-10-CM | POA: Diagnosis not present

## 2016-02-26 DIAGNOSIS — I4891 Unspecified atrial fibrillation: Secondary | ICD-10-CM | POA: Diagnosis not present

## 2016-02-26 DIAGNOSIS — E7800 Pure hypercholesterolemia, unspecified: Secondary | ICD-10-CM | POA: Diagnosis not present

## 2016-02-26 DIAGNOSIS — I10 Essential (primary) hypertension: Secondary | ICD-10-CM | POA: Diagnosis not present

## 2016-02-26 DIAGNOSIS — I4892 Unspecified atrial flutter: Secondary | ICD-10-CM | POA: Diagnosis not present

## 2016-03-02 ENCOUNTER — Encounter (HOSPITAL_COMMUNITY): Payer: Self-pay

## 2016-03-09 DIAGNOSIS — I25119 Atherosclerotic heart disease of native coronary artery with unspecified angina pectoris: Secondary | ICD-10-CM | POA: Diagnosis not present

## 2016-03-09 DIAGNOSIS — I248 Other forms of acute ischemic heart disease: Secondary | ICD-10-CM | POA: Diagnosis not present

## 2016-03-09 DIAGNOSIS — Z8679 Personal history of other diseases of the circulatory system: Secondary | ICD-10-CM | POA: Diagnosis not present

## 2016-03-09 DIAGNOSIS — E7801 Familial hypercholesterolemia: Secondary | ICD-10-CM | POA: Diagnosis not present

## 2016-03-09 DIAGNOSIS — I10 Essential (primary) hypertension: Secondary | ICD-10-CM | POA: Diagnosis not present

## 2016-03-09 DIAGNOSIS — I480 Paroxysmal atrial fibrillation: Secondary | ICD-10-CM | POA: Diagnosis not present

## 2016-03-09 DIAGNOSIS — Z9889 Other specified postprocedural states: Secondary | ICD-10-CM | POA: Diagnosis not present

## 2016-03-09 DIAGNOSIS — Z79899 Other long term (current) drug therapy: Secondary | ICD-10-CM | POA: Diagnosis not present

## 2016-03-09 DIAGNOSIS — Z45018 Encounter for adjustment and management of other part of cardiac pacemaker: Secondary | ICD-10-CM | POA: Diagnosis not present

## 2016-04-02 DIAGNOSIS — I340 Nonrheumatic mitral (valve) insufficiency: Secondary | ICD-10-CM | POA: Diagnosis not present

## 2016-04-02 DIAGNOSIS — I480 Paroxysmal atrial fibrillation: Secondary | ICD-10-CM | POA: Diagnosis not present

## 2016-04-02 DIAGNOSIS — I422 Other hypertrophic cardiomyopathy: Secondary | ICD-10-CM | POA: Diagnosis not present

## 2016-04-02 DIAGNOSIS — Z45018 Encounter for adjustment and management of other part of cardiac pacemaker: Secondary | ICD-10-CM | POA: Diagnosis not present

## 2016-04-08 DIAGNOSIS — N3000 Acute cystitis without hematuria: Secondary | ICD-10-CM | POA: Diagnosis not present

## 2016-04-08 DIAGNOSIS — Z9181 History of falling: Secondary | ICD-10-CM | POA: Diagnosis not present

## 2016-04-08 DIAGNOSIS — I422 Other hypertrophic cardiomyopathy: Secondary | ICD-10-CM | POA: Diagnosis not present

## 2016-04-08 DIAGNOSIS — I340 Nonrheumatic mitral (valve) insufficiency: Secondary | ICD-10-CM | POA: Diagnosis not present

## 2016-04-08 DIAGNOSIS — R69 Illness, unspecified: Secondary | ICD-10-CM | POA: Diagnosis not present

## 2016-04-17 ENCOUNTER — Other Ambulatory Visit (HOSPITAL_COMMUNITY): Payer: Self-pay | Admitting: Interventional Radiology

## 2016-04-17 ENCOUNTER — Telehealth (HOSPITAL_COMMUNITY): Payer: Self-pay

## 2016-04-17 NOTE — Telephone Encounter (Signed)
Called to schedule 6 month f/u ultrasound, left message for pt to return call. AW 

## 2016-04-20 DIAGNOSIS — Z950 Presence of cardiac pacemaker: Secondary | ICD-10-CM | POA: Diagnosis not present

## 2016-04-20 DIAGNOSIS — I480 Paroxysmal atrial fibrillation: Secondary | ICD-10-CM | POA: Diagnosis not present

## 2016-04-20 DIAGNOSIS — Z79899 Other long term (current) drug therapy: Secondary | ICD-10-CM | POA: Diagnosis not present

## 2016-04-20 DIAGNOSIS — I422 Other hypertrophic cardiomyopathy: Secondary | ICD-10-CM | POA: Diagnosis not present

## 2016-05-06 DIAGNOSIS — N401 Enlarged prostate with lower urinary tract symptoms: Secondary | ICD-10-CM | POA: Diagnosis not present

## 2016-05-06 DIAGNOSIS — R69 Illness, unspecified: Secondary | ICD-10-CM | POA: Diagnosis not present

## 2016-05-06 DIAGNOSIS — N3000 Acute cystitis without hematuria: Secondary | ICD-10-CM | POA: Diagnosis not present

## 2016-05-27 DIAGNOSIS — I25119 Atherosclerotic heart disease of native coronary artery with unspecified angina pectoris: Secondary | ICD-10-CM | POA: Diagnosis not present

## 2016-05-27 DIAGNOSIS — Z8679 Personal history of other diseases of the circulatory system: Secondary | ICD-10-CM | POA: Diagnosis not present

## 2016-05-27 DIAGNOSIS — Z45018 Encounter for adjustment and management of other part of cardiac pacemaker: Secondary | ICD-10-CM | POA: Diagnosis not present

## 2016-05-27 DIAGNOSIS — I480 Paroxysmal atrial fibrillation: Secondary | ICD-10-CM | POA: Diagnosis not present

## 2016-05-27 DIAGNOSIS — E7801 Familial hypercholesterolemia: Secondary | ICD-10-CM | POA: Diagnosis not present

## 2016-05-27 DIAGNOSIS — Z9889 Other specified postprocedural states: Secondary | ICD-10-CM | POA: Diagnosis not present

## 2016-05-27 DIAGNOSIS — I422 Other hypertrophic cardiomyopathy: Secondary | ICD-10-CM | POA: Diagnosis not present

## 2016-05-27 DIAGNOSIS — I10 Essential (primary) hypertension: Secondary | ICD-10-CM | POA: Diagnosis not present

## 2016-05-27 DIAGNOSIS — Z79899 Other long term (current) drug therapy: Secondary | ICD-10-CM | POA: Diagnosis not present

## 2016-05-28 DIAGNOSIS — Z79899 Other long term (current) drug therapy: Secondary | ICD-10-CM | POA: Diagnosis not present

## 2016-05-28 DIAGNOSIS — I10 Essential (primary) hypertension: Secondary | ICD-10-CM | POA: Diagnosis not present

## 2016-05-28 DIAGNOSIS — I25119 Atherosclerotic heart disease of native coronary artery with unspecified angina pectoris: Secondary | ICD-10-CM | POA: Diagnosis not present

## 2016-06-04 ENCOUNTER — Telehealth (HOSPITAL_COMMUNITY): Payer: Self-pay

## 2016-06-04 NOTE — Telephone Encounter (Signed)
Called to schedule f/u us carotid, left message for pt to return call. AW 

## 2016-06-25 DIAGNOSIS — F17211 Nicotine dependence, cigarettes, in remission: Secondary | ICD-10-CM | POA: Diagnosis not present

## 2016-06-25 DIAGNOSIS — Z950 Presence of cardiac pacemaker: Secondary | ICD-10-CM | POA: Diagnosis not present

## 2016-06-25 DIAGNOSIS — I480 Paroxysmal atrial fibrillation: Secondary | ICD-10-CM | POA: Diagnosis not present

## 2016-06-25 DIAGNOSIS — Z955 Presence of coronary angioplasty implant and graft: Secondary | ICD-10-CM | POA: Diagnosis not present

## 2016-06-25 DIAGNOSIS — R69 Illness, unspecified: Secondary | ICD-10-CM | POA: Diagnosis not present

## 2016-06-25 DIAGNOSIS — I340 Nonrheumatic mitral (valve) insufficiency: Secondary | ICD-10-CM | POA: Diagnosis not present

## 2016-06-25 DIAGNOSIS — I69920 Aphasia following unspecified cerebrovascular disease: Secondary | ICD-10-CM | POA: Diagnosis not present

## 2016-06-25 DIAGNOSIS — I241 Dressler's syndrome: Secondary | ICD-10-CM | POA: Diagnosis not present

## 2016-06-25 DIAGNOSIS — Z7901 Long term (current) use of anticoagulants: Secondary | ICD-10-CM | POA: Diagnosis not present

## 2016-06-25 DIAGNOSIS — I422 Other hypertrophic cardiomyopathy: Secondary | ICD-10-CM | POA: Diagnosis not present

## 2016-06-25 DIAGNOSIS — D696 Thrombocytopenia, unspecified: Secondary | ICD-10-CM | POA: Diagnosis not present

## 2016-06-25 DIAGNOSIS — G9340 Encephalopathy, unspecified: Secondary | ICD-10-CM | POA: Diagnosis not present

## 2016-06-25 DIAGNOSIS — R109 Unspecified abdominal pain: Secondary | ICD-10-CM | POA: Diagnosis not present

## 2016-06-25 DIAGNOSIS — I4891 Unspecified atrial fibrillation: Secondary | ICD-10-CM | POA: Diagnosis not present

## 2016-06-25 DIAGNOSIS — S3991XA Unspecified injury of abdomen, initial encounter: Secondary | ICD-10-CM | POA: Diagnosis not present

## 2016-06-25 DIAGNOSIS — I9589 Other hypotension: Secondary | ICD-10-CM | POA: Diagnosis not present

## 2016-06-25 DIAGNOSIS — E7800 Pure hypercholesterolemia, unspecified: Secondary | ICD-10-CM | POA: Diagnosis not present

## 2016-06-25 DIAGNOSIS — J449 Chronic obstructive pulmonary disease, unspecified: Secondary | ICD-10-CM | POA: Diagnosis not present

## 2016-06-25 DIAGNOSIS — I10 Essential (primary) hypertension: Secondary | ICD-10-CM | POA: Diagnosis not present

## 2016-06-25 DIAGNOSIS — I959 Hypotension, unspecified: Secondary | ICD-10-CM | POA: Diagnosis not present

## 2016-06-25 DIAGNOSIS — I2510 Atherosclerotic heart disease of native coronary artery without angina pectoris: Secondary | ICD-10-CM | POA: Diagnosis not present

## 2016-06-25 DIAGNOSIS — I272 Pulmonary hypertension, unspecified: Secondary | ICD-10-CM | POA: Diagnosis not present

## 2016-06-25 DIAGNOSIS — Z9181 History of falling: Secondary | ICD-10-CM | POA: Diagnosis not present

## 2016-06-25 DIAGNOSIS — N179 Acute kidney failure, unspecified: Secondary | ICD-10-CM | POA: Diagnosis not present

## 2016-06-25 DIAGNOSIS — R400 Somnolence: Secondary | ICD-10-CM | POA: Diagnosis not present

## 2016-06-25 DIAGNOSIS — I517 Cardiomegaly: Secondary | ICD-10-CM | POA: Diagnosis not present

## 2016-06-25 DIAGNOSIS — I21A1 Myocardial infarction type 2: Secondary | ICD-10-CM | POA: Diagnosis not present

## 2016-06-25 DIAGNOSIS — R002 Palpitations: Secondary | ICD-10-CM | POA: Diagnosis not present

## 2016-06-25 DIAGNOSIS — R079 Chest pain, unspecified: Secondary | ICD-10-CM | POA: Diagnosis not present

## 2016-06-25 DIAGNOSIS — S299XXA Unspecified injury of thorax, initial encounter: Secondary | ICD-10-CM | POA: Diagnosis not present

## 2016-06-25 DIAGNOSIS — I252 Old myocardial infarction: Secondary | ICD-10-CM | POA: Diagnosis not present

## 2016-06-25 DIAGNOSIS — I214 Non-ST elevation (NSTEMI) myocardial infarction: Secondary | ICD-10-CM | POA: Diagnosis not present

## 2016-06-25 DIAGNOSIS — I361 Nonrheumatic tricuspid (valve) insufficiency: Secondary | ICD-10-CM | POA: Diagnosis not present

## 2016-06-26 DIAGNOSIS — I361 Nonrheumatic tricuspid (valve) insufficiency: Secondary | ICD-10-CM | POA: Diagnosis not present

## 2016-06-26 DIAGNOSIS — I272 Pulmonary hypertension, unspecified: Secondary | ICD-10-CM | POA: Diagnosis not present

## 2016-06-26 DIAGNOSIS — R69 Illness, unspecified: Secondary | ICD-10-CM | POA: Diagnosis not present

## 2016-06-26 DIAGNOSIS — I422 Other hypertrophic cardiomyopathy: Secondary | ICD-10-CM | POA: Diagnosis not present

## 2016-06-26 DIAGNOSIS — I10 Essential (primary) hypertension: Secondary | ICD-10-CM | POA: Diagnosis not present

## 2016-06-26 DIAGNOSIS — E7800 Pure hypercholesterolemia, unspecified: Secondary | ICD-10-CM | POA: Diagnosis not present

## 2016-06-26 DIAGNOSIS — I517 Cardiomegaly: Secondary | ICD-10-CM | POA: Diagnosis not present

## 2016-06-26 DIAGNOSIS — D696 Thrombocytopenia, unspecified: Secondary | ICD-10-CM | POA: Diagnosis not present

## 2016-06-26 DIAGNOSIS — I959 Hypotension, unspecified: Secondary | ICD-10-CM | POA: Diagnosis not present

## 2016-06-26 DIAGNOSIS — Z955 Presence of coronary angioplasty implant and graft: Secondary | ICD-10-CM | POA: Diagnosis not present

## 2016-06-26 DIAGNOSIS — Z950 Presence of cardiac pacemaker: Secondary | ICD-10-CM | POA: Diagnosis not present

## 2016-06-26 DIAGNOSIS — I480 Paroxysmal atrial fibrillation: Secondary | ICD-10-CM | POA: Diagnosis not present

## 2016-06-26 DIAGNOSIS — I340 Nonrheumatic mitral (valve) insufficiency: Secondary | ICD-10-CM | POA: Diagnosis not present

## 2016-06-26 DIAGNOSIS — I252 Old myocardial infarction: Secondary | ICD-10-CM | POA: Diagnosis not present

## 2016-06-27 DIAGNOSIS — E7800 Pure hypercholesterolemia, unspecified: Secondary | ICD-10-CM | POA: Diagnosis not present

## 2016-06-27 DIAGNOSIS — I422 Other hypertrophic cardiomyopathy: Secondary | ICD-10-CM | POA: Diagnosis not present

## 2016-06-27 DIAGNOSIS — I480 Paroxysmal atrial fibrillation: Secondary | ICD-10-CM | POA: Diagnosis not present

## 2016-06-27 DIAGNOSIS — I10 Essential (primary) hypertension: Secondary | ICD-10-CM | POA: Diagnosis not present

## 2016-06-27 DIAGNOSIS — D696 Thrombocytopenia, unspecified: Secondary | ICD-10-CM | POA: Diagnosis not present

## 2016-06-27 DIAGNOSIS — I2510 Atherosclerotic heart disease of native coronary artery without angina pectoris: Secondary | ICD-10-CM | POA: Diagnosis not present

## 2016-06-27 DIAGNOSIS — Z950 Presence of cardiac pacemaker: Secondary | ICD-10-CM | POA: Diagnosis not present

## 2016-06-28 DIAGNOSIS — E7800 Pure hypercholesterolemia, unspecified: Secondary | ICD-10-CM | POA: Diagnosis not present

## 2016-06-28 DIAGNOSIS — Z950 Presence of cardiac pacemaker: Secondary | ICD-10-CM | POA: Diagnosis not present

## 2016-06-28 DIAGNOSIS — I517 Cardiomegaly: Secondary | ICD-10-CM | POA: Diagnosis not present

## 2016-06-28 DIAGNOSIS — I480 Paroxysmal atrial fibrillation: Secondary | ICD-10-CM | POA: Diagnosis not present

## 2016-06-28 DIAGNOSIS — Z7901 Long term (current) use of anticoagulants: Secondary | ICD-10-CM | POA: Diagnosis not present

## 2016-06-28 DIAGNOSIS — I10 Essential (primary) hypertension: Secondary | ICD-10-CM | POA: Diagnosis not present

## 2016-06-28 DIAGNOSIS — I2510 Atherosclerotic heart disease of native coronary artery without angina pectoris: Secondary | ICD-10-CM | POA: Diagnosis not present

## 2016-06-28 DIAGNOSIS — I422 Other hypertrophic cardiomyopathy: Secondary | ICD-10-CM | POA: Diagnosis not present

## 2016-06-28 DIAGNOSIS — D696 Thrombocytopenia, unspecified: Secondary | ICD-10-CM | POA: Diagnosis not present

## 2016-07-01 DIAGNOSIS — I480 Paroxysmal atrial fibrillation: Secondary | ICD-10-CM | POA: Diagnosis not present

## 2016-07-01 DIAGNOSIS — Z45018 Encounter for adjustment and management of other part of cardiac pacemaker: Secondary | ICD-10-CM | POA: Diagnosis not present

## 2016-07-01 DIAGNOSIS — Z7901 Long term (current) use of anticoagulants: Secondary | ICD-10-CM | POA: Diagnosis not present

## 2016-07-01 DIAGNOSIS — Z950 Presence of cardiac pacemaker: Secondary | ICD-10-CM | POA: Diagnosis not present

## 2016-07-01 DIAGNOSIS — I10 Essential (primary) hypertension: Secondary | ICD-10-CM | POA: Diagnosis not present

## 2016-07-01 DIAGNOSIS — I25119 Atherosclerotic heart disease of native coronary artery with unspecified angina pectoris: Secondary | ICD-10-CM | POA: Diagnosis not present

## 2016-07-02 DIAGNOSIS — I10 Essential (primary) hypertension: Secondary | ICD-10-CM | POA: Diagnosis not present

## 2016-07-02 DIAGNOSIS — I2510 Atherosclerotic heart disease of native coronary artery without angina pectoris: Secondary | ICD-10-CM | POA: Diagnosis not present

## 2016-07-02 DIAGNOSIS — E669 Obesity, unspecified: Secondary | ICD-10-CM | POA: Diagnosis not present

## 2016-07-02 DIAGNOSIS — Z9181 History of falling: Secondary | ICD-10-CM | POA: Diagnosis not present

## 2016-07-02 DIAGNOSIS — I480 Paroxysmal atrial fibrillation: Secondary | ICD-10-CM | POA: Diagnosis not present

## 2016-07-02 DIAGNOSIS — J449 Chronic obstructive pulmonary disease, unspecified: Secondary | ICD-10-CM | POA: Diagnosis not present

## 2016-07-02 DIAGNOSIS — Z6831 Body mass index (BMI) 31.0-31.9, adult: Secondary | ICD-10-CM | POA: Diagnosis not present

## 2016-07-02 DIAGNOSIS — I214 Non-ST elevation (NSTEMI) myocardial infarction: Secondary | ICD-10-CM | POA: Diagnosis not present

## 2016-07-02 DIAGNOSIS — Z950 Presence of cardiac pacemaker: Secondary | ICD-10-CM | POA: Diagnosis not present

## 2016-07-02 DIAGNOSIS — Z7901 Long term (current) use of anticoagulants: Secondary | ICD-10-CM | POA: Diagnosis not present

## 2016-07-07 DIAGNOSIS — I2510 Atherosclerotic heart disease of native coronary artery without angina pectoris: Secondary | ICD-10-CM | POA: Diagnosis not present

## 2016-07-07 DIAGNOSIS — Z7901 Long term (current) use of anticoagulants: Secondary | ICD-10-CM | POA: Diagnosis not present

## 2016-07-07 DIAGNOSIS — Z9181 History of falling: Secondary | ICD-10-CM | POA: Diagnosis not present

## 2016-07-07 DIAGNOSIS — Z6831 Body mass index (BMI) 31.0-31.9, adult: Secondary | ICD-10-CM | POA: Diagnosis not present

## 2016-07-07 DIAGNOSIS — Z950 Presence of cardiac pacemaker: Secondary | ICD-10-CM | POA: Diagnosis not present

## 2016-07-07 DIAGNOSIS — E669 Obesity, unspecified: Secondary | ICD-10-CM | POA: Diagnosis not present

## 2016-07-07 DIAGNOSIS — I214 Non-ST elevation (NSTEMI) myocardial infarction: Secondary | ICD-10-CM | POA: Diagnosis not present

## 2016-07-07 DIAGNOSIS — J449 Chronic obstructive pulmonary disease, unspecified: Secondary | ICD-10-CM | POA: Diagnosis not present

## 2016-07-07 DIAGNOSIS — I480 Paroxysmal atrial fibrillation: Secondary | ICD-10-CM | POA: Diagnosis not present

## 2016-07-07 DIAGNOSIS — I10 Essential (primary) hypertension: Secondary | ICD-10-CM | POA: Diagnosis not present

## 2016-07-09 DIAGNOSIS — I2510 Atherosclerotic heart disease of native coronary artery without angina pectoris: Secondary | ICD-10-CM | POA: Diagnosis not present

## 2016-07-09 DIAGNOSIS — E669 Obesity, unspecified: Secondary | ICD-10-CM | POA: Diagnosis not present

## 2016-07-09 DIAGNOSIS — I10 Essential (primary) hypertension: Secondary | ICD-10-CM | POA: Diagnosis not present

## 2016-07-09 DIAGNOSIS — Z950 Presence of cardiac pacemaker: Secondary | ICD-10-CM | POA: Diagnosis not present

## 2016-07-09 DIAGNOSIS — Z9181 History of falling: Secondary | ICD-10-CM | POA: Diagnosis not present

## 2016-07-09 DIAGNOSIS — Z7901 Long term (current) use of anticoagulants: Secondary | ICD-10-CM | POA: Diagnosis not present

## 2016-07-09 DIAGNOSIS — I214 Non-ST elevation (NSTEMI) myocardial infarction: Secondary | ICD-10-CM | POA: Diagnosis not present

## 2016-07-09 DIAGNOSIS — J449 Chronic obstructive pulmonary disease, unspecified: Secondary | ICD-10-CM | POA: Diagnosis not present

## 2016-07-09 DIAGNOSIS — I480 Paroxysmal atrial fibrillation: Secondary | ICD-10-CM | POA: Diagnosis not present

## 2016-07-09 DIAGNOSIS — Z6831 Body mass index (BMI) 31.0-31.9, adult: Secondary | ICD-10-CM | POA: Diagnosis not present

## 2016-07-10 DIAGNOSIS — I10 Essential (primary) hypertension: Secondary | ICD-10-CM | POA: Diagnosis not present

## 2016-07-10 DIAGNOSIS — I480 Paroxysmal atrial fibrillation: Secondary | ICD-10-CM | POA: Diagnosis not present

## 2016-07-10 DIAGNOSIS — E669 Obesity, unspecified: Secondary | ICD-10-CM | POA: Diagnosis not present

## 2016-07-10 DIAGNOSIS — I214 Non-ST elevation (NSTEMI) myocardial infarction: Secondary | ICD-10-CM | POA: Diagnosis not present

## 2016-07-10 DIAGNOSIS — Z7901 Long term (current) use of anticoagulants: Secondary | ICD-10-CM | POA: Diagnosis not present

## 2016-07-10 DIAGNOSIS — J449 Chronic obstructive pulmonary disease, unspecified: Secondary | ICD-10-CM | POA: Diagnosis not present

## 2016-07-10 DIAGNOSIS — I2510 Atherosclerotic heart disease of native coronary artery without angina pectoris: Secondary | ICD-10-CM | POA: Diagnosis not present

## 2016-07-10 DIAGNOSIS — Z6831 Body mass index (BMI) 31.0-31.9, adult: Secondary | ICD-10-CM | POA: Diagnosis not present

## 2016-07-10 DIAGNOSIS — Z9181 History of falling: Secondary | ICD-10-CM | POA: Diagnosis not present

## 2016-07-10 DIAGNOSIS — Z950 Presence of cardiac pacemaker: Secondary | ICD-10-CM | POA: Diagnosis not present

## 2016-07-13 DIAGNOSIS — E669 Obesity, unspecified: Secondary | ICD-10-CM | POA: Diagnosis not present

## 2016-07-13 DIAGNOSIS — Z7901 Long term (current) use of anticoagulants: Secondary | ICD-10-CM | POA: Diagnosis not present

## 2016-07-13 DIAGNOSIS — Z6831 Body mass index (BMI) 31.0-31.9, adult: Secondary | ICD-10-CM | POA: Diagnosis not present

## 2016-07-13 DIAGNOSIS — J449 Chronic obstructive pulmonary disease, unspecified: Secondary | ICD-10-CM | POA: Diagnosis not present

## 2016-07-13 DIAGNOSIS — Z9181 History of falling: Secondary | ICD-10-CM | POA: Diagnosis not present

## 2016-07-13 DIAGNOSIS — I214 Non-ST elevation (NSTEMI) myocardial infarction: Secondary | ICD-10-CM | POA: Diagnosis not present

## 2016-07-13 DIAGNOSIS — I10 Essential (primary) hypertension: Secondary | ICD-10-CM | POA: Diagnosis not present

## 2016-07-13 DIAGNOSIS — I2510 Atherosclerotic heart disease of native coronary artery without angina pectoris: Secondary | ICD-10-CM | POA: Diagnosis not present

## 2016-07-13 DIAGNOSIS — Z950 Presence of cardiac pacemaker: Secondary | ICD-10-CM | POA: Diagnosis not present

## 2016-07-13 DIAGNOSIS — I480 Paroxysmal atrial fibrillation: Secondary | ICD-10-CM | POA: Diagnosis not present

## 2016-07-14 DIAGNOSIS — I214 Non-ST elevation (NSTEMI) myocardial infarction: Secondary | ICD-10-CM | POA: Diagnosis not present

## 2016-07-14 DIAGNOSIS — J449 Chronic obstructive pulmonary disease, unspecified: Secondary | ICD-10-CM | POA: Diagnosis not present

## 2016-07-14 DIAGNOSIS — I10 Essential (primary) hypertension: Secondary | ICD-10-CM | POA: Diagnosis not present

## 2016-07-14 DIAGNOSIS — I480 Paroxysmal atrial fibrillation: Secondary | ICD-10-CM | POA: Diagnosis not present

## 2016-07-14 DIAGNOSIS — I2510 Atherosclerotic heart disease of native coronary artery without angina pectoris: Secondary | ICD-10-CM | POA: Diagnosis not present

## 2016-07-14 DIAGNOSIS — Z950 Presence of cardiac pacemaker: Secondary | ICD-10-CM | POA: Diagnosis not present

## 2016-07-14 DIAGNOSIS — Z6831 Body mass index (BMI) 31.0-31.9, adult: Secondary | ICD-10-CM | POA: Diagnosis not present

## 2016-07-14 DIAGNOSIS — E669 Obesity, unspecified: Secondary | ICD-10-CM | POA: Diagnosis not present

## 2016-07-14 DIAGNOSIS — Z7901 Long term (current) use of anticoagulants: Secondary | ICD-10-CM | POA: Diagnosis not present

## 2016-07-14 DIAGNOSIS — Z9181 History of falling: Secondary | ICD-10-CM | POA: Diagnosis not present

## 2016-07-15 ENCOUNTER — Encounter: Payer: Self-pay | Admitting: Cardiology

## 2016-07-15 ENCOUNTER — Ambulatory Visit (INDEPENDENT_AMBULATORY_CARE_PROVIDER_SITE_OTHER): Payer: Medicare HMO | Admitting: Cardiology

## 2016-07-15 ENCOUNTER — Encounter (INDEPENDENT_AMBULATORY_CARE_PROVIDER_SITE_OTHER): Payer: Self-pay

## 2016-07-15 VITALS — BP 152/90 | HR 89 | Ht 71.5 in | Wt 226.4 lb

## 2016-07-15 DIAGNOSIS — I2510 Atherosclerotic heart disease of native coronary artery without angina pectoris: Secondary | ICD-10-CM

## 2016-07-15 DIAGNOSIS — I10 Essential (primary) hypertension: Secondary | ICD-10-CM

## 2016-07-15 DIAGNOSIS — I480 Paroxysmal atrial fibrillation: Secondary | ICD-10-CM

## 2016-07-15 DIAGNOSIS — I495 Sick sinus syndrome: Secondary | ICD-10-CM | POA: Diagnosis not present

## 2016-07-15 LAB — CUP PACEART INCLINIC DEVICE CHECK
Battery Remaining Longevity: 127 mo
Brady Statistic AP VP Percent: 0 %
Brady Statistic AP VS Percent: 100 %
Brady Statistic AS VP Percent: 0 %
Brady Statistic AS VS Percent: 0 %
Implantable Lead Implant Date: 20080417
Implantable Lead Model: 5076
Implantable Pulse Generator Implant Date: 20161125
Lead Channel Impedance Value: 482 Ohm
Lead Channel Pacing Threshold Amplitude: 0.875 V
Lead Channel Pacing Threshold Amplitude: 1.25 V
Lead Channel Pacing Threshold Pulse Width: 0.4 ms
Lead Channel Pacing Threshold Pulse Width: 0.4 ms
Lead Channel Pacing Threshold Pulse Width: 0.4 ms
Lead Channel Pacing Threshold Pulse Width: 0.4 ms
MDC IDC LEAD IMPLANT DT: 20080417
MDC IDC LEAD LOCATION: 753859
MDC IDC LEAD LOCATION: 753860
MDC IDC MSMT BATTERY IMPEDANCE: 151 Ohm
MDC IDC MSMT BATTERY VOLTAGE: 2.79 V
MDC IDC MSMT LEADCHNL RV IMPEDANCE VALUE: 583 Ohm
MDC IDC MSMT LEADCHNL RV PACING THRESHOLD AMPLITUDE: 0.75 V
MDC IDC MSMT LEADCHNL RV PACING THRESHOLD AMPLITUDE: 0.75 V
MDC IDC SESS DTM: 20180523131313
MDC IDC SET LEADCHNL RA PACING AMPLITUDE: 1.75 V
MDC IDC SET LEADCHNL RV PACING AMPLITUDE: 2 V
MDC IDC SET LEADCHNL RV PACING PULSEWIDTH: 0.4 ms
MDC IDC SET LEADCHNL RV SENSING SENSITIVITY: 5.6 mV

## 2016-07-15 MED ORDER — METOPROLOL SUCCINATE ER 25 MG PO TB24: 25.0000 mg | ORAL_TABLET | Freq: Two times a day (BID) | ORAL | 3 refills | Status: DC

## 2016-07-15 NOTE — Patient Instructions (Addendum)
Medication Instructions:   Your physician has recommended you make the following change in your medication:  1) STOP Lopressor (Metoprolol Tartrate) 2) START Toprol (Metoprolol Succinate) 25 mg twice a day  Labwork:  None ordered  Testing/Procedures:  None ordered  Follow-Up:  Your physician recommends that you schedule a follow-up appointment in: 3 months with Dr. Elberta Fortisamnitz.  - If you need a refill on your cardiac medications before your next appointment, please call your pharmacy.   Thank you for choosing [facility]!!   Dory HornSherri Keirstyn Aydt, RN [phone]  Any Other Special Instructions Will Be Listed Below (If Applicable).   Metoprolol extended-release tablets What is this medicine? METOPROLOL (me TOE proe lole) is a beta-blocker. Beta-blockers reduce the workload on the heart and help it to beat more regularly. This medicine is used to treat high blood pressure and to prevent chest pain. It is also used to after a heart attack and to prevent an additional heart attack from occurring. This medicine may be used for other purposes; ask your health care provider or pharmacist if you have questions. COMMON BRAND NAME(S): toprol, Toprol XL What should I tell my health care provider before I take this medicine? They need to know if you have any of these conditions: -diabetes -heart or vessel disease like slow heart rate, worsening heart failure, heart block, sick sinus syndrome or Raynaud's disease -kidney disease -liver disease -lung or breathing disease, like asthma or emphysema -pheochromocytoma -thyroid disease -an unusual or allergic reaction to metoprolol, other beta-blockers, medicines, foods, dyes, or preservatives -pregnant or trying to get pregnant -breast-feeding How should I use this medicine? Take this medicine by mouth with a glass of water. Follow the directions on the prescription label. Do not crush or chew. Take this medicine with or immediately after  meals. Take your doses at regular intervals. Do not take more medicine than directed. Do not stop taking this medicine suddenly. This could lead to serious heart-related effects. Talk to your pediatrician regarding the use of this medicine in children. While this drug may be prescribed for children as young as 6 years for selected conditions, precautions do apply. Overdosage: If you think you have taken too much of this medicine contact a poison control center or emergency room at once. NOTE: This medicine is only for you. Do not share this medicine with others. What if I miss a dose? If you miss a dose, take it as soon as you can. If it is almost time for your next dose, take only that dose. Do not take double or extra doses. What may interact with this medicine? This medicine may interact with the following medications: -certain medicines for blood pressure, heart disease, irregular heart beat -certain medicines for depression, like monoamine oxidase (MAO) inhibitors, fluoxetine, or paroxetine -clonidine -dobutamine -epinephrine -isoproterenol -reserpine This list may not describe all possible interactions. Give your health care provider a list of all the medicines, herbs, non-prescription drugs, or dietary supplements you use. Also tell them if you smoke, drink alcohol, or use illegal drugs. Some items may interact with your medicine. What should I watch for while using this medicine? Visit your doctor or health care professional for regular check ups. Contact your doctor right away if your symptoms worsen. Check your blood pressure and pulse rate regularly. Ask your health care professional what your blood pressure and pulse rate should be, and when you should contact them. You may get drowsy or dizzy. Do not drive, use machinery, or do anything  that needs mental alertness until you know how this medicine affects you. Do not sit or stand up quickly, especially if you are an older patient. This  reduces the risk of dizzy or fainting spells. Contact your doctor if these symptoms continue. Alcohol may interfere with the effect of this medicine. Avoid alcoholic drinks. What side effects may I notice from receiving this medicine? Side effects that you should report to your doctor or health care professional as soon as possible: -allergic reactions like skin rash, itching or hives -cold or numb hands or feet -depression -difficulty breathing -faint -fever with sore throat -irregular heartbeat, chest pain -rapid weight gain -swollen legs or ankles Side effects that usually do not require medical attention (report to your doctor or health care professional if they continue or are bothersome): -anxiety or nervousness -change in sex drive or performance -dry skin -headache -nightmares or trouble sleeping -short term memory loss -stomach upset or diarrhea -unusually tired This list may not describe all possible side effects. Call your doctor for medical advice about side effects. You may report side effects to FDA at 1-800-FDA-1088. Where should I keep my medicine? Keep out of the reach of children. Store at room temperature between 15 and 30 degrees C (59 and 86 degrees F). Throw away any unused medicine after the expiration date. NOTE: This sheet is a summary. It may not cover all possible information. If you have questions about this medicine, talk to your doctor, pharmacist, or health care provider.  2018 Elsevier/Gold Standard (2012-10-14 14:41:37)

## 2016-07-15 NOTE — Progress Notes (Addendum)
Electrophysiology Office Note   Date:  07/15/2016   ID:  Luis Robinson, DOB 1940-03-29, MRN [id number]  PCP:  Alinda Deem, MD  Cardiologist:  Dulce Sellar Primary Electrophysiologist:  Will Jorja Loa, MD    Chief Complaint  Patient presents with  . Pacemaker Check    PAF     History of Present Illness: Luis Robinson is a 76 y.o. male who is being seen today for the evaluation of atrial fibrillation at the request of Alinda Deem, MD. Presenting today for electrophysiology evaluation. He has history of coronary disease status post an STEMI with PCI on 10/13/2014, paroxysmal atrial fibrillation, ablation 01/13/2015 of a left-sided atrial flutter, hypertension, CVA, on amiodarone and with a pacemaker in place. He has had multiple cardioversions while on amiodarone. He has had 2 cardioversions within the last 6 months. When he goes in atrial fibrillation, he has symptoms of fatigue and shortness of breath. He has had to be hospitalized 2 times prior due to his symptoms. He otherwise has no further symptoms when he is in normal rhythm. He is feeling well today without major complaint.     Today, he denies symptoms of palpitations, chest pain, shortness of breath, orthopnea, PND, lower extremity edema, claudication, dizziness, presyncope, syncope, bleeding, or neurologic sequela. The patient is tolerating medications without difficulties.    Past Medical History:  Diagnosis Date  . CAD S/P percutaneous coronary angioplasty Aug 2016   Dx1 DES, no other significant dis  . Chronic anticoagulation    Eliquis  . Dizziness   . Dyspnea   . Hyperlipidemia   . Hypertension   . Hypertrophic cardiomyopathy (HCC)   . PAF (paroxysmal atrial fibrillation) (HCC)   . SSS (sick sinus syndrome) (HCC)   . Tobacco use disorder    Past Surgical History:  Procedure Laterality Date  . ABLATION  Nov 2016   [facility]  . back surgery x 2    . INSERT / REPLACE / REMOVE PACEMAKER  2008   MDT  .  RADIOLOGY WITH ANESTHESIA N/A 05/20/2015   Procedure: RADIOLOGY WITH ANESTHESIA;  Surgeon: Medication Radiologist, MD;  Location: [facility] OR;  Service: Radiology;  Laterality: N/A;  . rotator cuff surgery       Current Outpatient Prescriptions  Medication Sig Dispense Refill  . amiodarone (PACERONE) 200 MG tablet Take 200 mg by mouth 2 (two) times daily.    Marland Kitchen apixaban (ELIQUIS) 5 MG TABS tablet Take 1 tablet (5 mg total) by mouth 2 (two) times daily. 60 tablet 1  . clopidogrel (PLAVIX) 75 MG tablet Take 1 tablet (75 mg total) by mouth daily with breakfast. 30 tablet 1  . Multiple Vitamins-Minerals (MULTIVITAMIN WITH MINERALS) tablet Take 1 tablet by mouth daily.    . Omega-3 Fatty Acids (FISH OIL) 1000 MG CPDR Take 1,000 mg by mouth daily.    Marland Kitchen PARoxetine (PAXIL) 10 MG tablet Take 10 mg by mouth daily.    . rosuvastatin (CRESTOR) 40 MG tablet Take 40 mg by mouth daily.    . tamsulosin (FLOMAX) 0.4 MG CAPS capsule Take 0.4 mg by mouth daily.  2  . metoprolol succinate (TOPROL XL) 25 MG 24 hr tablet Take 1 tablet (25 mg total) by mouth 2 (two) times daily. 60 tablet 3   No current facility-administered medications for this visit.     Allergies:   Bee venom   Social History:  The patient  reports that he has been smoking Cigarettes.  He has been smoking about 0.50 packs  per day. He has never used smokeless tobacco. He reports that he does not drink alcohol or use drugs.   Family History:  The patient's family history includes Cancer (age of onset: 14) in his sister.    ROS:  Please see the history of present illness.   Otherwise, review of systems is positive for palpitations, depression.   All other systems are reviewed and negative.    PHYSICAL EXAM: VS:  BP (!) 152/90 (BP Location: Left Arm)   Pulse 89   Ht 5' 11.5" (1.816 m)   Wt 226 lb 6.4 oz (102.7 kg)   BMI 31.14 kg/m  , BMI Body mass index is 31.14 kg/m. GEN: Well nourished, well developed, in no acute distress  HEENT: normal    Neck: no JVD, carotid bruits, or masses Cardiac: RRR; no murmurs, rubs, or gallops,no edema  Respiratory:  clear to auscultation bilaterally, normal work of breathing GI: soft, nontender, nondistended, + BS MS: no deformity or atrophy  Skin: warm and dry,  device pocket is well healed Neuro:  Strength and sensation are intact Psych: euthymic mood, full affect  EKG:  EKG is ordered today. Personal review of the ekg ordered shows Atrial paced rhythm, first-degree AV block, old septal infarct, prolonged QTc 501 ms   Device interrogation is reviewed today in detail.  See PaceArt for details.   Recent Labs: 01/29/2016: BUN 17; Creatinine, Ser 1.24; Hemoglobin 13.4; Platelets 165; Potassium 4.2; Sodium 141    Lipid Panel     Component Value Date/Time   CHOL 84 05/21/2015 0522   TRIG 59 05/21/2015 0522   HDL 29 (L) 05/21/2015 0522   CHOLHDL 2.9 05/21/2015 0522   VLDL 12 05/21/2015 0522   LDLCALC 43 05/21/2015 0522     Wt Readings from Last 3 Encounters:  07/15/16 226 lb 6.4 oz (102.7 kg)  01/28/16 204 lb 11.2 oz (92.9 kg)  06/05/15 176 lb 2.4 oz (79.9 kg)      Other studies Reviewed: Additional studies/ records that were reviewed today include: TTE 05/23/15  Review of the above records today demonstrates:  - Left ventricle: The cavity size was normal. There was moderate   concentric hypertrophy. Systolic function was normal. The   estimated ejection fraction was in the range of 60% to 65%. Wall   motion was normal; there were no regional wall motion   abnormalities. Diastolic dysfunction with elevated LV filling   pressure. - Aortic valve: Mildly calcified leaflets. There was no stenosis.   There was trivial regurgitation. - Mitral valve: Heavily calcified annulus. Mildly thickened   leaflets . Mild stenosis. There was moderate regurgitation. - Left atrium: Severely dilated at 62 ml/m2. - Tricuspid valve: There was moderate regurgitation. - Pulmonary arteries: PA peak  pressure: 54 mm Hg (S). - Systemic veins: The IVC measures >2.1 cm, but collapses more than   50%, suggesting an RA pressure of 8 mmHG.  02/07/16 Myoview  Nuclear stress EF: 55%.  The study is normal.  This is a low risk study.  The left ventricular ejection fraction is normal (55-65%).  There was no ST segment deviation noted during stress.  ASSESSMENT AND PLAN:  1.  Paroxysmal atrial fibrillation: Currently on Eliquis. Has been loaded on amiodarone in the past. Continues to have symptoms while on amiodarone. Unfortunately his left atrium is severely enlarged at 6.2 cm. Due to his severely enlarged left atrium, I do not feel that ablation is the correct course of management at this time. We  did discuss AV nodal ablation versus continued management with increasing his metoprolol. We'll adjust his 12.5 mg of metoprolol to 25 mg of Toprol-XL twice a day. This may help to control his rates in atrial fibrillation. In the future, should he have more A. fib with uncontrolled rates, he would benefit from an AV nodal ablation.  This patients CHA2DS2-VASc Score and unadjusted Ischemic Stroke Rate (% per year) is equal to 9.7 % stroke rate/year from a score of 6  Above score calculated as 1 point each if present [CHF, HTN, DM, Vascular=MI/PAD/Aortic Plaque, Age if 65-74, or Male] Above score calculated as 2 points each if present [Age > 75, or Stroke/TIA/TE]  2. Hypertension: Blood pressure is elevated today. Has been low in the past. Adjusting metoprolol dosage.  3. Coronary artery disease status post an STEMI and drug-eluting stent placement: No current chest pain. Continue current management.  4. Sick sinus syndrome:: Atrially paced at 30 bpm. Device settings unchanged at this visit.     Current medicines are reviewed at length with the patient today.   The patient does not have concerns regarding his medicines.  The following changes were made today:  Stop metoprolol, start Toprol-XL  25 mg twice a day  Labs/ tests ordered today include:  Orders Placed This Encounter  Procedures  . EKG 12-Lead     Disposition:   FU with Will Camnitz 3 months  Signed, Will Jorja LoaMartin Camnitz, MD  07/15/2016 9:37 AM     [facility] [address] [place] [address] [phone] (office) [phone] (fax)

## 2016-07-16 DIAGNOSIS — Z950 Presence of cardiac pacemaker: Secondary | ICD-10-CM | POA: Diagnosis not present

## 2016-07-16 DIAGNOSIS — Z7901 Long term (current) use of anticoagulants: Secondary | ICD-10-CM | POA: Diagnosis not present

## 2016-07-16 DIAGNOSIS — I10 Essential (primary) hypertension: Secondary | ICD-10-CM | POA: Diagnosis not present

## 2016-07-16 DIAGNOSIS — E669 Obesity, unspecified: Secondary | ICD-10-CM | POA: Diagnosis not present

## 2016-07-16 DIAGNOSIS — Z6831 Body mass index (BMI) 31.0-31.9, adult: Secondary | ICD-10-CM | POA: Diagnosis not present

## 2016-07-16 DIAGNOSIS — I2510 Atherosclerotic heart disease of native coronary artery without angina pectoris: Secondary | ICD-10-CM | POA: Diagnosis not present

## 2016-07-16 DIAGNOSIS — J449 Chronic obstructive pulmonary disease, unspecified: Secondary | ICD-10-CM | POA: Diagnosis not present

## 2016-07-16 DIAGNOSIS — I214 Non-ST elevation (NSTEMI) myocardial infarction: Secondary | ICD-10-CM | POA: Diagnosis not present

## 2016-07-16 DIAGNOSIS — I480 Paroxysmal atrial fibrillation: Secondary | ICD-10-CM | POA: Diagnosis not present

## 2016-07-16 DIAGNOSIS — Z9181 History of falling: Secondary | ICD-10-CM | POA: Diagnosis not present

## 2016-07-17 DIAGNOSIS — Z950 Presence of cardiac pacemaker: Secondary | ICD-10-CM | POA: Diagnosis not present

## 2016-07-17 DIAGNOSIS — I2510 Atherosclerotic heart disease of native coronary artery without angina pectoris: Secondary | ICD-10-CM | POA: Diagnosis not present

## 2016-07-17 DIAGNOSIS — Z6831 Body mass index (BMI) 31.0-31.9, adult: Secondary | ICD-10-CM | POA: Diagnosis not present

## 2016-07-17 DIAGNOSIS — Z7901 Long term (current) use of anticoagulants: Secondary | ICD-10-CM | POA: Diagnosis not present

## 2016-07-17 DIAGNOSIS — I10 Essential (primary) hypertension: Secondary | ICD-10-CM | POA: Diagnosis not present

## 2016-07-17 DIAGNOSIS — J449 Chronic obstructive pulmonary disease, unspecified: Secondary | ICD-10-CM | POA: Diagnosis not present

## 2016-07-17 DIAGNOSIS — I480 Paroxysmal atrial fibrillation: Secondary | ICD-10-CM | POA: Diagnosis not present

## 2016-07-17 DIAGNOSIS — Z9181 History of falling: Secondary | ICD-10-CM | POA: Diagnosis not present

## 2016-07-17 DIAGNOSIS — E669 Obesity, unspecified: Secondary | ICD-10-CM | POA: Diagnosis not present

## 2016-07-17 DIAGNOSIS — I214 Non-ST elevation (NSTEMI) myocardial infarction: Secondary | ICD-10-CM | POA: Diagnosis not present

## 2016-07-21 DIAGNOSIS — N3000 Acute cystitis without hematuria: Secondary | ICD-10-CM | POA: Diagnosis not present

## 2016-07-21 DIAGNOSIS — R0602 Shortness of breath: Secondary | ICD-10-CM | POA: Diagnosis not present

## 2016-07-21 DIAGNOSIS — R42 Dizziness and giddiness: Secondary | ICD-10-CM | POA: Diagnosis not present

## 2016-07-21 DIAGNOSIS — E860 Dehydration: Secondary | ICD-10-CM | POA: Diagnosis not present

## 2016-07-21 DIAGNOSIS — R404 Transient alteration of awareness: Secondary | ICD-10-CM | POA: Diagnosis not present

## 2016-07-22 DIAGNOSIS — Z950 Presence of cardiac pacemaker: Secondary | ICD-10-CM | POA: Diagnosis not present

## 2016-07-22 DIAGNOSIS — I480 Paroxysmal atrial fibrillation: Secondary | ICD-10-CM | POA: Diagnosis not present

## 2016-07-22 DIAGNOSIS — I214 Non-ST elevation (NSTEMI) myocardial infarction: Secondary | ICD-10-CM | POA: Diagnosis not present

## 2016-07-22 DIAGNOSIS — E669 Obesity, unspecified: Secondary | ICD-10-CM | POA: Diagnosis not present

## 2016-07-22 DIAGNOSIS — I10 Essential (primary) hypertension: Secondary | ICD-10-CM | POA: Diagnosis not present

## 2016-07-22 DIAGNOSIS — I2510 Atherosclerotic heart disease of native coronary artery without angina pectoris: Secondary | ICD-10-CM | POA: Diagnosis not present

## 2016-07-22 DIAGNOSIS — Z6831 Body mass index (BMI) 31.0-31.9, adult: Secondary | ICD-10-CM | POA: Diagnosis not present

## 2016-07-22 DIAGNOSIS — J449 Chronic obstructive pulmonary disease, unspecified: Secondary | ICD-10-CM | POA: Diagnosis not present

## 2016-07-22 DIAGNOSIS — Z7901 Long term (current) use of anticoagulants: Secondary | ICD-10-CM | POA: Diagnosis not present

## 2016-07-22 DIAGNOSIS — Z9181 History of falling: Secondary | ICD-10-CM | POA: Diagnosis not present

## 2016-07-23 DIAGNOSIS — Z9181 History of falling: Secondary | ICD-10-CM | POA: Diagnosis not present

## 2016-07-23 DIAGNOSIS — I480 Paroxysmal atrial fibrillation: Secondary | ICD-10-CM | POA: Diagnosis not present

## 2016-07-23 DIAGNOSIS — Z950 Presence of cardiac pacemaker: Secondary | ICD-10-CM | POA: Diagnosis not present

## 2016-07-23 DIAGNOSIS — I10 Essential (primary) hypertension: Secondary | ICD-10-CM | POA: Diagnosis not present

## 2016-07-23 DIAGNOSIS — E669 Obesity, unspecified: Secondary | ICD-10-CM | POA: Diagnosis not present

## 2016-07-23 DIAGNOSIS — I2510 Atherosclerotic heart disease of native coronary artery without angina pectoris: Secondary | ICD-10-CM | POA: Diagnosis not present

## 2016-07-23 DIAGNOSIS — Z6831 Body mass index (BMI) 31.0-31.9, adult: Secondary | ICD-10-CM | POA: Diagnosis not present

## 2016-07-23 DIAGNOSIS — J449 Chronic obstructive pulmonary disease, unspecified: Secondary | ICD-10-CM | POA: Diagnosis not present

## 2016-07-23 DIAGNOSIS — I214 Non-ST elevation (NSTEMI) myocardial infarction: Secondary | ICD-10-CM | POA: Diagnosis not present

## 2016-07-23 DIAGNOSIS — Z7901 Long term (current) use of anticoagulants: Secondary | ICD-10-CM | POA: Diagnosis not present

## 2016-07-28 DIAGNOSIS — Z950 Presence of cardiac pacemaker: Secondary | ICD-10-CM | POA: Diagnosis not present

## 2016-07-28 DIAGNOSIS — Z9181 History of falling: Secondary | ICD-10-CM | POA: Diagnosis not present

## 2016-07-28 DIAGNOSIS — Z7901 Long term (current) use of anticoagulants: Secondary | ICD-10-CM | POA: Diagnosis not present

## 2016-07-28 DIAGNOSIS — I2510 Atherosclerotic heart disease of native coronary artery without angina pectoris: Secondary | ICD-10-CM | POA: Diagnosis not present

## 2016-07-28 DIAGNOSIS — Z6831 Body mass index (BMI) 31.0-31.9, adult: Secondary | ICD-10-CM | POA: Diagnosis not present

## 2016-07-28 DIAGNOSIS — E669 Obesity, unspecified: Secondary | ICD-10-CM | POA: Diagnosis not present

## 2016-07-28 DIAGNOSIS — I214 Non-ST elevation (NSTEMI) myocardial infarction: Secondary | ICD-10-CM | POA: Diagnosis not present

## 2016-07-28 DIAGNOSIS — I480 Paroxysmal atrial fibrillation: Secondary | ICD-10-CM | POA: Diagnosis not present

## 2016-07-28 DIAGNOSIS — I10 Essential (primary) hypertension: Secondary | ICD-10-CM | POA: Diagnosis not present

## 2016-07-28 DIAGNOSIS — J449 Chronic obstructive pulmonary disease, unspecified: Secondary | ICD-10-CM | POA: Diagnosis not present

## 2016-07-29 DIAGNOSIS — I480 Paroxysmal atrial fibrillation: Secondary | ICD-10-CM | POA: Diagnosis not present

## 2016-07-29 DIAGNOSIS — J449 Chronic obstructive pulmonary disease, unspecified: Secondary | ICD-10-CM | POA: Diagnosis not present

## 2016-07-29 DIAGNOSIS — I10 Essential (primary) hypertension: Secondary | ICD-10-CM | POA: Diagnosis not present

## 2016-07-29 DIAGNOSIS — Z6831 Body mass index (BMI) 31.0-31.9, adult: Secondary | ICD-10-CM | POA: Diagnosis not present

## 2016-07-29 DIAGNOSIS — Z9181 History of falling: Secondary | ICD-10-CM | POA: Diagnosis not present

## 2016-07-29 DIAGNOSIS — Z950 Presence of cardiac pacemaker: Secondary | ICD-10-CM | POA: Diagnosis not present

## 2016-07-29 DIAGNOSIS — Z7901 Long term (current) use of anticoagulants: Secondary | ICD-10-CM | POA: Diagnosis not present

## 2016-07-29 DIAGNOSIS — I214 Non-ST elevation (NSTEMI) myocardial infarction: Secondary | ICD-10-CM | POA: Diagnosis not present

## 2016-07-29 DIAGNOSIS — I2510 Atherosclerotic heart disease of native coronary artery without angina pectoris: Secondary | ICD-10-CM | POA: Diagnosis not present

## 2016-07-29 DIAGNOSIS — E669 Obesity, unspecified: Secondary | ICD-10-CM | POA: Diagnosis not present

## 2016-07-31 DIAGNOSIS — J449 Chronic obstructive pulmonary disease, unspecified: Secondary | ICD-10-CM | POA: Diagnosis not present

## 2016-07-31 DIAGNOSIS — Z6831 Body mass index (BMI) 31.0-31.9, adult: Secondary | ICD-10-CM | POA: Diagnosis not present

## 2016-07-31 DIAGNOSIS — Z9181 History of falling: Secondary | ICD-10-CM | POA: Diagnosis not present

## 2016-07-31 DIAGNOSIS — I480 Paroxysmal atrial fibrillation: Secondary | ICD-10-CM | POA: Diagnosis not present

## 2016-07-31 DIAGNOSIS — Z7901 Long term (current) use of anticoagulants: Secondary | ICD-10-CM | POA: Diagnosis not present

## 2016-07-31 DIAGNOSIS — I10 Essential (primary) hypertension: Secondary | ICD-10-CM | POA: Diagnosis not present

## 2016-07-31 DIAGNOSIS — I2510 Atherosclerotic heart disease of native coronary artery without angina pectoris: Secondary | ICD-10-CM | POA: Diagnosis not present

## 2016-07-31 DIAGNOSIS — I214 Non-ST elevation (NSTEMI) myocardial infarction: Secondary | ICD-10-CM | POA: Diagnosis not present

## 2016-07-31 DIAGNOSIS — Z950 Presence of cardiac pacemaker: Secondary | ICD-10-CM | POA: Diagnosis not present

## 2016-07-31 DIAGNOSIS — E669 Obesity, unspecified: Secondary | ICD-10-CM | POA: Diagnosis not present

## 2016-08-02 DIAGNOSIS — I214 Non-ST elevation (NSTEMI) myocardial infarction: Secondary | ICD-10-CM | POA: Diagnosis not present

## 2016-08-02 DIAGNOSIS — Z950 Presence of cardiac pacemaker: Secondary | ICD-10-CM | POA: Diagnosis not present

## 2016-08-02 DIAGNOSIS — Z7901 Long term (current) use of anticoagulants: Secondary | ICD-10-CM | POA: Diagnosis not present

## 2016-08-02 DIAGNOSIS — Z9181 History of falling: Secondary | ICD-10-CM | POA: Diagnosis not present

## 2016-08-02 DIAGNOSIS — E669 Obesity, unspecified: Secondary | ICD-10-CM | POA: Diagnosis not present

## 2016-08-02 DIAGNOSIS — Z6831 Body mass index (BMI) 31.0-31.9, adult: Secondary | ICD-10-CM | POA: Diagnosis not present

## 2016-08-02 DIAGNOSIS — I480 Paroxysmal atrial fibrillation: Secondary | ICD-10-CM | POA: Diagnosis not present

## 2016-08-02 DIAGNOSIS — I10 Essential (primary) hypertension: Secondary | ICD-10-CM | POA: Diagnosis not present

## 2016-08-02 DIAGNOSIS — I2510 Atherosclerotic heart disease of native coronary artery without angina pectoris: Secondary | ICD-10-CM | POA: Diagnosis not present

## 2016-08-02 DIAGNOSIS — J449 Chronic obstructive pulmonary disease, unspecified: Secondary | ICD-10-CM | POA: Diagnosis not present

## 2016-08-04 DIAGNOSIS — E669 Obesity, unspecified: Secondary | ICD-10-CM | POA: Diagnosis not present

## 2016-08-04 DIAGNOSIS — I2510 Atherosclerotic heart disease of native coronary artery without angina pectoris: Secondary | ICD-10-CM | POA: Diagnosis not present

## 2016-08-04 DIAGNOSIS — Z7901 Long term (current) use of anticoagulants: Secondary | ICD-10-CM | POA: Diagnosis not present

## 2016-08-04 DIAGNOSIS — I10 Essential (primary) hypertension: Secondary | ICD-10-CM | POA: Diagnosis not present

## 2016-08-04 DIAGNOSIS — J449 Chronic obstructive pulmonary disease, unspecified: Secondary | ICD-10-CM | POA: Diagnosis not present

## 2016-08-04 DIAGNOSIS — Z6831 Body mass index (BMI) 31.0-31.9, adult: Secondary | ICD-10-CM | POA: Diagnosis not present

## 2016-08-04 DIAGNOSIS — Z9181 History of falling: Secondary | ICD-10-CM | POA: Diagnosis not present

## 2016-08-04 DIAGNOSIS — Z950 Presence of cardiac pacemaker: Secondary | ICD-10-CM | POA: Diagnosis not present

## 2016-08-04 DIAGNOSIS — I480 Paroxysmal atrial fibrillation: Secondary | ICD-10-CM | POA: Diagnosis not present

## 2016-08-04 DIAGNOSIS — I214 Non-ST elevation (NSTEMI) myocardial infarction: Secondary | ICD-10-CM | POA: Diagnosis not present

## 2016-08-05 DIAGNOSIS — Z950 Presence of cardiac pacemaker: Secondary | ICD-10-CM | POA: Diagnosis not present

## 2016-08-05 DIAGNOSIS — E669 Obesity, unspecified: Secondary | ICD-10-CM | POA: Diagnosis not present

## 2016-08-05 DIAGNOSIS — Z6831 Body mass index (BMI) 31.0-31.9, adult: Secondary | ICD-10-CM | POA: Diagnosis not present

## 2016-08-05 DIAGNOSIS — I10 Essential (primary) hypertension: Secondary | ICD-10-CM | POA: Diagnosis not present

## 2016-08-05 DIAGNOSIS — I480 Paroxysmal atrial fibrillation: Secondary | ICD-10-CM | POA: Diagnosis not present

## 2016-08-05 DIAGNOSIS — Z7901 Long term (current) use of anticoagulants: Secondary | ICD-10-CM | POA: Diagnosis not present

## 2016-08-05 DIAGNOSIS — J449 Chronic obstructive pulmonary disease, unspecified: Secondary | ICD-10-CM | POA: Diagnosis not present

## 2016-08-05 DIAGNOSIS — Z9181 History of falling: Secondary | ICD-10-CM | POA: Diagnosis not present

## 2016-08-05 DIAGNOSIS — I214 Non-ST elevation (NSTEMI) myocardial infarction: Secondary | ICD-10-CM | POA: Diagnosis not present

## 2016-08-05 DIAGNOSIS — I2510 Atherosclerotic heart disease of native coronary artery without angina pectoris: Secondary | ICD-10-CM | POA: Diagnosis not present

## 2016-08-18 DIAGNOSIS — Z7901 Long term (current) use of anticoagulants: Secondary | ICD-10-CM | POA: Diagnosis not present

## 2016-08-18 DIAGNOSIS — I10 Essential (primary) hypertension: Secondary | ICD-10-CM | POA: Diagnosis not present

## 2016-08-18 DIAGNOSIS — I2510 Atherosclerotic heart disease of native coronary artery without angina pectoris: Secondary | ICD-10-CM | POA: Diagnosis not present

## 2016-08-18 DIAGNOSIS — E669 Obesity, unspecified: Secondary | ICD-10-CM | POA: Diagnosis not present

## 2016-08-18 DIAGNOSIS — I214 Non-ST elevation (NSTEMI) myocardial infarction: Secondary | ICD-10-CM | POA: Diagnosis not present

## 2016-08-18 DIAGNOSIS — Z9181 History of falling: Secondary | ICD-10-CM | POA: Diagnosis not present

## 2016-08-18 DIAGNOSIS — I480 Paroxysmal atrial fibrillation: Secondary | ICD-10-CM | POA: Diagnosis not present

## 2016-08-18 DIAGNOSIS — Z950 Presence of cardiac pacemaker: Secondary | ICD-10-CM | POA: Diagnosis not present

## 2016-08-18 DIAGNOSIS — J449 Chronic obstructive pulmonary disease, unspecified: Secondary | ICD-10-CM | POA: Diagnosis not present

## 2016-08-18 DIAGNOSIS — Z6831 Body mass index (BMI) 31.0-31.9, adult: Secondary | ICD-10-CM | POA: Diagnosis not present

## 2016-10-19 ENCOUNTER — Encounter: Payer: Self-pay | Admitting: Cardiology

## 2016-10-19 ENCOUNTER — Ambulatory Visit (INDEPENDENT_AMBULATORY_CARE_PROVIDER_SITE_OTHER): Payer: Medicare HMO | Admitting: Cardiology

## 2016-10-19 VITALS — BP 110/58 | HR 67 | Ht 69.0 in | Wt 240.0 lb

## 2016-10-19 DIAGNOSIS — I495 Sick sinus syndrome: Secondary | ICD-10-CM

## 2016-10-19 DIAGNOSIS — I480 Paroxysmal atrial fibrillation: Secondary | ICD-10-CM | POA: Diagnosis not present

## 2016-10-19 DIAGNOSIS — Z79899 Other long term (current) drug therapy: Secondary | ICD-10-CM

## 2016-10-19 LAB — CUP PACEART INCLINIC DEVICE CHECK
Battery Remaining Longevity: 128 mo
Brady Statistic AP VP Percent: 0 %
Brady Statistic AP VS Percent: 100 %
Brady Statistic AS VS Percent: 0 %
Implantable Lead Location: 753860
Implantable Lead Model: 5076
Implantable Pulse Generator Implant Date: 20161125
Lead Channel Impedance Value: 470 Ohm
Lead Channel Impedance Value: 585 Ohm
Lead Channel Pacing Threshold Amplitude: 0.75 V
Lead Channel Pacing Threshold Amplitude: 0.75 V
Lead Channel Pacing Threshold Pulse Width: 0.4 ms
Lead Channel Pacing Threshold Pulse Width: 0.4 ms
Lead Channel Pacing Threshold Pulse Width: 0.4 ms
Lead Channel Pacing Threshold Pulse Width: 0.4 ms
Lead Channel Setting Pacing Amplitude: 1.625
Lead Channel Setting Pacing Pulse Width: 0.4 ms
MDC IDC LEAD IMPLANT DT: 20080417
MDC IDC LEAD IMPLANT DT: 20080417
MDC IDC LEAD LOCATION: 753859
MDC IDC MSMT BATTERY IMPEDANCE: 150 Ohm
MDC IDC MSMT BATTERY VOLTAGE: 2.79 V
MDC IDC MSMT LEADCHNL RA PACING THRESHOLD AMPLITUDE: 0.75 V
MDC IDC MSMT LEADCHNL RV PACING THRESHOLD AMPLITUDE: 0.75 V
MDC IDC MSMT LEADCHNL RV SENSING INTR AMPL: 22.4 mV
MDC IDC SESS DTM: 20180827113156
MDC IDC SET LEADCHNL RV PACING AMPLITUDE: 2 V
MDC IDC SET LEADCHNL RV SENSING SENSITIVITY: 5.6 mV
MDC IDC STAT BRADY AS VP PERCENT: 0 %

## 2016-10-19 NOTE — Progress Notes (Signed)
Electrophysiology Office Note   Date:  10/19/2016   ID:  Luis Robinson, DOB 06-Jun-1940, MRN [id number]  PCP:  Alinda Deem, MD  Cardiologist:  Dulce Sellar Primary Electrophysiologist:  Wolfe Camarena Jorja Loa, MD    Chief Complaint  Patient presents with  . Pacemaker Check    PAF     History of Present Illness: Luis Robinson is a 76 y.o. male who is being seen today for the evaluation of atrial fibrillation at the request of Alinda Deem, MD. Presenting today for electrophysiology evaluation. He has history of coronary disease status post an STEMI with PCI on 10/13/2014, paroxysmal atrial fibrillation, ablation 01/13/2015 of a left-sided atrial flutter, hypertension, CVA, on amiodarone and with a pacemaker in place. He has had multiple cardioversions while on amiodarone. He has had 2 cardioversions within the last 6 months. When he goes in atrial fibrillation, he has symptoms of fatigue and shortness of breath. He has had to be hospitalized 2 times prior due to his symptoms. He otherwise has no further symptoms when he is in normal rhythm. He is feeling well today without major complaint.   Today, denies symptoms of palpitations, chest pain, shortness of breath, orthopnea, PND, lower extremity edema, claudication, dizziness, presyncope, syncope, bleeding, or neurologic sequela. The patient is tolerating medications without difficulties and is otherwise without complaint today.      Past Medical History:  Diagnosis Date  . CAD S/P percutaneous coronary angioplasty Aug 2016   Dx1 DES, no other significant dis  . Chronic anticoagulation    Eliquis  . Dizziness   . Dyspnea   . Hyperlipidemia   . Hypertension   . Hypertrophic cardiomyopathy (HCC)   . PAF (paroxysmal atrial fibrillation) (HCC)   . SSS (sick sinus syndrome) (HCC)   . Tobacco use disorder    Past Surgical History:  Procedure Laterality Date  . ABLATION  Nov 2016   [facility]  . back surgery x 2    . INSERT / REPLACE /  REMOVE PACEMAKER  2008   MDT  . RADIOLOGY WITH ANESTHESIA N/A 05/20/2015   Procedure: RADIOLOGY WITH ANESTHESIA;  Surgeon: Medication Radiologist, MD;  Location: [facility] OR;  Service: Radiology;  Laterality: N/A;  . rotator cuff surgery       Current Outpatient Prescriptions  Medication Sig Dispense Refill  . amiodarone (PACERONE) 200 MG tablet Take 200 mg by mouth 2 (two) times daily.    Marland Kitchen apixaban (ELIQUIS) 5 MG TABS tablet Take 1 tablet (5 mg total) by mouth 2 (two) times daily. 60 tablet 1  . clopidogrel (PLAVIX) 75 MG tablet Take 1 tablet (75 mg total) by mouth daily with breakfast. 30 tablet 1  . metoprolol succinate (TOPROL-XL) 25 MG 24 hr tablet Take 12.5 mg by mouth daily.    . Multiple Vitamins-Minerals (MULTIVITAMIN WITH MINERALS) tablet Take 1 tablet by mouth daily.    . Omega-3 Fatty Acids (FISH OIL) 1000 MG CPDR Take 1,000 mg by mouth daily.    Marland Kitchen PARoxetine (PAXIL) 10 MG tablet Take 10 mg by mouth daily.    . rosuvastatin (CRESTOR) 40 MG tablet Take 40 mg by mouth daily.    . tamsulosin (FLOMAX) 0.4 MG CAPS capsule Take 0.4 mg by mouth daily.  2   No current facility-administered medications for this visit.     Allergies:   Bee venom   Social History:  The patient  reports that he has been smoking Cigarettes.  He has been smoking about 0.50 packs per day. He  has never used smokeless tobacco. He reports that he does not drink alcohol or use drugs.   Family History:  The patient's family history includes Cancer (age of onset: 53) in his sister.    ROS:  Please see the history of present illness.   Otherwise, review of systems is positive for none.   All other systems are reviewed and negative.   PHYSICAL EXAM: VS:  BP (!) 110/58   Pulse 67   Ht 5\' 9"  (1.753 m)   Wt 240 lb (108.9 kg)   BMI 35.44 kg/m  , BMI Body mass index is 35.44 kg/m. GEN: Well nourished, well developed, in no acute distress  HEENT: normal  Neck: no JVD, carotid bruits, or masses Cardiac: RRR; no  murmurs, rubs, or gallops,no edema  Respiratory:  clear to auscultation bilaterally, normal work of breathing GI: soft, nontender, nondistended, + BS MS: no deformity or atrophy  Skin: warm and dry, device site well healed Neuro:  Strength and sensation are intact Psych: euthymic mood, full affect  EKG:  EKG is not ordered today. Personal review of the ekg ordered 07/15/16 shows A paced  Personal review of the device interrogation today. Results in Paceart   Recent Labs: 01/29/2016: BUN 17; Creatinine, Ser 1.24; Hemoglobin 13.4; Platelets 165; Potassium 4.2; Sodium 141    Lipid Panel     Component Value Date/Time   CHOL 84 05/21/2015 0522   TRIG 59 05/21/2015 0522   HDL 29 (L) 05/21/2015 0522   CHOLHDL 2.9 05/21/2015 0522   VLDL 12 05/21/2015 0522   LDLCALC 43 05/21/2015 0522     Wt Readings from Last 3 Encounters:  10/19/16 240 lb (108.9 kg)  07/15/16 226 lb 6.4 oz (102.7 kg)  01/28/16 204 lb 11.2 oz (92.9 kg)      Other studies Reviewed: Additional studies/ records that were reviewed today include: TTE 05/23/15  Review of the above records today demonstrates:  - Left ventricle: The cavity size was normal. There was moderate   concentric hypertrophy. Systolic function was normal. The   estimated ejection fraction was in the range of 60% to 65%. Wall   motion was normal; there were no regional wall motion   abnormalities. Diastolic dysfunction with elevated LV filling   pressure. - Aortic valve: Mildly calcified leaflets. There was no stenosis.   There was trivial regurgitation. - Mitral valve: Heavily calcified annulus. Mildly thickened   leaflets . Mild stenosis. There was moderate regurgitation. - Left atrium: Severely dilated at 62 ml/m2. - Tricuspid valve: There was moderate regurgitation. - Pulmonary arteries: PA peak pressure: 54 mm Hg (S). - Systemic veins: The IVC measures >2.1 cm, but collapses more than   50%, suggesting an RA pressure of 8  mmHG.  02/07/16 Myoview  Nuclear stress EF: 55%.  The study is normal.  This is a low risk study.  The left ventricular ejection fraction is normal (55-65%).  There was no ST segment deviation noted during stress.  ASSESSMENT AND PLAN:  1.  Paroxysmal atrial fibrillation: On Eliquis. Currently on amiodarone and is maintaining an atrial paced rhythm. We'll plan to check amiodarone labs today. His left atrium is severely dilated and thus not a candidate for ablation at this time.  This patients CHA2DS2-VASc Score and unadjusted Ischemic Stroke Rate (% per year) is equal to 9.7 % stroke rate/year from a score of 6  Above score calculated as 1 point each if present [CHF, HTN, DM, Vascular=MI/PAD/Aortic Plaque, Age if 30-74, or  Male] Above score calculated as 2 points each if present [Age > 75, or Stroke/TIA/TE]   2. Hypertension: Blood pressure well controlled today. No changes at this time.  3. Coronary artery disease status post an STEMI and drug-eluting stent placement: No current chest pain. Continue current management.  4. Sick sinus syndrome: Atrial paced at 30. No device changes at this time.    Current medicines are reviewed at length with the patient today.   The patient does not have concerns regarding his medicines.  The following changes were made today:    Labs/ tests ordered today include:  No orders of the defined types were placed in this encounter.    Disposition:   FU with Briani Maul 6 months  Signed, Cassaundra Rasch Jorja Loa, MD  10/19/2016 11:21 AM     [facility] [address] [place] [address] [phone] (office) [phone] (fax)

## 2016-10-19 NOTE — Patient Instructions (Addendum)
Medication Instructions:  Your physician recommends that you continue on your current medications as directed. Please refer to the Current Medication list given to you today.  If you need a refill on your cardiac medications before your next appointment, please call your pharmacy.   Labwork: Amiodarone surveillance lab work today: HFP & TSH  Testing/Procedures: None ordered  Follow-Up: Remote monitoring is used to monitor your Pacemaker of ICD from home. This monitoring reduces the number of office visits required to check your device to one time per year. It allows [place] to keep an eye on the functioning of your device to ensure it is working properly. You are scheduled for a device check from home on 01/18/2017. You may send your transmission at any time that day. If you have a wireless device, the transmission will be sent automatically. After your physician reviews your transmission, you will receive a postcard with your next transmission date.  Your physician wants you to follow-up in: 6 months with Dr. Elberta Fortis.  You will receive a reminder letter in the mail two months in advance. If you don't receive a letter, please call our office to schedule the follow-up appointment.  Thank you for choosing [facility]!!   Dory Horn, RN [phone]

## 2016-10-20 LAB — HEPATIC FUNCTION PANEL
ALT: 61 IU/L — ABNORMAL HIGH (ref 0–44)
AST: 69 IU/L — ABNORMAL HIGH (ref 0–40)
Albumin: 4 g/dL (ref 3.5–4.8)
Alkaline Phosphatase: 62 IU/L (ref 39–117)
Bilirubin Total: 0.4 mg/dL (ref 0.0–1.2)
Bilirubin, Direct: 0.16 mg/dL (ref 0.00–0.40)
Total Protein: 6.3 g/dL (ref 6.0–8.5)

## 2016-10-20 LAB — TSH: TSH: 7.59 u[IU]/mL — ABNORMAL HIGH (ref 0.450–4.500)

## 2016-12-07 DIAGNOSIS — N401 Enlarged prostate with lower urinary tract symptoms: Secondary | ICD-10-CM | POA: Diagnosis not present

## 2016-12-07 DIAGNOSIS — Z79899 Other long term (current) drug therapy: Secondary | ICD-10-CM | POA: Diagnosis not present

## 2016-12-07 DIAGNOSIS — I4891 Unspecified atrial fibrillation: Secondary | ICD-10-CM | POA: Diagnosis not present

## 2016-12-07 DIAGNOSIS — E7800 Pure hypercholesterolemia, unspecified: Secondary | ICD-10-CM | POA: Diagnosis not present

## 2016-12-07 DIAGNOSIS — I10 Essential (primary) hypertension: Secondary | ICD-10-CM | POA: Diagnosis not present

## 2016-12-30 DIAGNOSIS — Z23 Encounter for immunization: Secondary | ICD-10-CM | POA: Diagnosis not present

## 2017-01-18 ENCOUNTER — Encounter: Payer: Medicare HMO | Admitting: *Deleted

## 2017-01-18 ENCOUNTER — Telehealth: Payer: Self-pay | Admitting: Cardiology

## 2017-01-18 NOTE — Telephone Encounter (Signed)
LMOVM reminding pt to send remote transmission.   

## 2017-01-21 ENCOUNTER — Encounter: Payer: Self-pay | Admitting: Cardiology

## 2017-03-08 DIAGNOSIS — R12 Heartburn: Secondary | ICD-10-CM | POA: Diagnosis not present

## 2017-03-08 DIAGNOSIS — R946 Abnormal results of thyroid function studies: Secondary | ICD-10-CM | POA: Diagnosis not present

## 2017-03-08 DIAGNOSIS — E7800 Pure hypercholesterolemia, unspecified: Secondary | ICD-10-CM | POA: Diagnosis not present

## 2017-03-08 DIAGNOSIS — Z79899 Other long term (current) drug therapy: Secondary | ICD-10-CM | POA: Diagnosis not present

## 2017-03-08 DIAGNOSIS — I10 Essential (primary) hypertension: Secondary | ICD-10-CM | POA: Diagnosis not present

## 2017-03-12 DIAGNOSIS — Z8673 Personal history of transient ischemic attack (TIA), and cerebral infarction without residual deficits: Secondary | ICD-10-CM | POA: Diagnosis not present

## 2017-04-02 DIAGNOSIS — R69 Illness, unspecified: Secondary | ICD-10-CM | POA: Diagnosis not present

## 2017-04-02 DIAGNOSIS — R296 Repeated falls: Secondary | ICD-10-CM | POA: Diagnosis not present

## 2017-04-02 DIAGNOSIS — E669 Obesity, unspecified: Secondary | ICD-10-CM | POA: Diagnosis not present

## 2017-04-02 DIAGNOSIS — Z6838 Body mass index (BMI) 38.0-38.9, adult: Secondary | ICD-10-CM | POA: Diagnosis not present

## 2017-04-02 DIAGNOSIS — I2510 Atherosclerotic heart disease of native coronary artery without angina pectoris: Secondary | ICD-10-CM | POA: Diagnosis not present

## 2017-04-02 DIAGNOSIS — J449 Chronic obstructive pulmonary disease, unspecified: Secondary | ICD-10-CM | POA: Diagnosis not present

## 2017-04-02 DIAGNOSIS — R260 Ataxic gait: Secondary | ICD-10-CM | POA: Diagnosis not present

## 2017-04-02 DIAGNOSIS — I480 Paroxysmal atrial fibrillation: Secondary | ICD-10-CM | POA: Diagnosis not present

## 2017-04-02 DIAGNOSIS — I69320 Aphasia following cerebral infarction: Secondary | ICD-10-CM | POA: Diagnosis not present

## 2017-04-02 DIAGNOSIS — I10 Essential (primary) hypertension: Secondary | ICD-10-CM | POA: Diagnosis not present

## 2017-04-06 DIAGNOSIS — R296 Repeated falls: Secondary | ICD-10-CM | POA: Diagnosis not present

## 2017-04-06 DIAGNOSIS — I10 Essential (primary) hypertension: Secondary | ICD-10-CM | POA: Diagnosis not present

## 2017-04-06 DIAGNOSIS — R69 Illness, unspecified: Secondary | ICD-10-CM | POA: Diagnosis not present

## 2017-04-06 DIAGNOSIS — I2510 Atherosclerotic heart disease of native coronary artery without angina pectoris: Secondary | ICD-10-CM | POA: Diagnosis not present

## 2017-04-06 DIAGNOSIS — R260 Ataxic gait: Secondary | ICD-10-CM | POA: Diagnosis not present

## 2017-04-06 DIAGNOSIS — Z6838 Body mass index (BMI) 38.0-38.9, adult: Secondary | ICD-10-CM | POA: Diagnosis not present

## 2017-04-06 DIAGNOSIS — I480 Paroxysmal atrial fibrillation: Secondary | ICD-10-CM | POA: Diagnosis not present

## 2017-04-06 DIAGNOSIS — I69320 Aphasia following cerebral infarction: Secondary | ICD-10-CM | POA: Diagnosis not present

## 2017-04-06 DIAGNOSIS — E669 Obesity, unspecified: Secondary | ICD-10-CM | POA: Diagnosis not present

## 2017-04-06 DIAGNOSIS — J449 Chronic obstructive pulmonary disease, unspecified: Secondary | ICD-10-CM | POA: Diagnosis not present

## 2017-04-08 DIAGNOSIS — I480 Paroxysmal atrial fibrillation: Secondary | ICD-10-CM | POA: Diagnosis not present

## 2017-04-08 DIAGNOSIS — I69320 Aphasia following cerebral infarction: Secondary | ICD-10-CM | POA: Diagnosis not present

## 2017-04-08 DIAGNOSIS — I10 Essential (primary) hypertension: Secondary | ICD-10-CM | POA: Diagnosis not present

## 2017-04-08 DIAGNOSIS — J449 Chronic obstructive pulmonary disease, unspecified: Secondary | ICD-10-CM | POA: Diagnosis not present

## 2017-04-08 DIAGNOSIS — R69 Illness, unspecified: Secondary | ICD-10-CM | POA: Diagnosis not present

## 2017-04-08 DIAGNOSIS — E669 Obesity, unspecified: Secondary | ICD-10-CM | POA: Diagnosis not present

## 2017-04-08 DIAGNOSIS — R296 Repeated falls: Secondary | ICD-10-CM | POA: Diagnosis not present

## 2017-04-08 DIAGNOSIS — R260 Ataxic gait: Secondary | ICD-10-CM | POA: Diagnosis not present

## 2017-04-08 DIAGNOSIS — I2510 Atherosclerotic heart disease of native coronary artery without angina pectoris: Secondary | ICD-10-CM | POA: Diagnosis not present

## 2017-04-08 DIAGNOSIS — Z6838 Body mass index (BMI) 38.0-38.9, adult: Secondary | ICD-10-CM | POA: Diagnosis not present

## 2017-04-12 DIAGNOSIS — Z8673 Personal history of transient ischemic attack (TIA), and cerebral infarction without residual deficits: Secondary | ICD-10-CM | POA: Diagnosis not present

## 2017-04-13 DIAGNOSIS — R69 Illness, unspecified: Secondary | ICD-10-CM | POA: Diagnosis not present

## 2017-04-13 DIAGNOSIS — I2510 Atherosclerotic heart disease of native coronary artery without angina pectoris: Secondary | ICD-10-CM | POA: Diagnosis not present

## 2017-04-13 DIAGNOSIS — E669 Obesity, unspecified: Secondary | ICD-10-CM | POA: Diagnosis not present

## 2017-04-13 DIAGNOSIS — I69320 Aphasia following cerebral infarction: Secondary | ICD-10-CM | POA: Diagnosis not present

## 2017-04-13 DIAGNOSIS — I480 Paroxysmal atrial fibrillation: Secondary | ICD-10-CM | POA: Diagnosis not present

## 2017-04-13 DIAGNOSIS — R260 Ataxic gait: Secondary | ICD-10-CM | POA: Diagnosis not present

## 2017-04-13 DIAGNOSIS — Z6838 Body mass index (BMI) 38.0-38.9, adult: Secondary | ICD-10-CM | POA: Diagnosis not present

## 2017-04-13 DIAGNOSIS — I10 Essential (primary) hypertension: Secondary | ICD-10-CM | POA: Diagnosis not present

## 2017-04-13 DIAGNOSIS — J449 Chronic obstructive pulmonary disease, unspecified: Secondary | ICD-10-CM | POA: Diagnosis not present

## 2017-04-13 DIAGNOSIS — R296 Repeated falls: Secondary | ICD-10-CM | POA: Diagnosis not present

## 2017-04-16 DIAGNOSIS — Z6838 Body mass index (BMI) 38.0-38.9, adult: Secondary | ICD-10-CM | POA: Diagnosis not present

## 2017-04-16 DIAGNOSIS — R260 Ataxic gait: Secondary | ICD-10-CM | POA: Diagnosis not present

## 2017-04-16 DIAGNOSIS — J449 Chronic obstructive pulmonary disease, unspecified: Secondary | ICD-10-CM | POA: Diagnosis not present

## 2017-04-16 DIAGNOSIS — I69320 Aphasia following cerebral infarction: Secondary | ICD-10-CM | POA: Diagnosis not present

## 2017-04-16 DIAGNOSIS — I2510 Atherosclerotic heart disease of native coronary artery without angina pectoris: Secondary | ICD-10-CM | POA: Diagnosis not present

## 2017-04-16 DIAGNOSIS — I10 Essential (primary) hypertension: Secondary | ICD-10-CM | POA: Diagnosis not present

## 2017-04-16 DIAGNOSIS — R69 Illness, unspecified: Secondary | ICD-10-CM | POA: Diagnosis not present

## 2017-04-16 DIAGNOSIS — I480 Paroxysmal atrial fibrillation: Secondary | ICD-10-CM | POA: Diagnosis not present

## 2017-04-16 DIAGNOSIS — R296 Repeated falls: Secondary | ICD-10-CM | POA: Diagnosis not present

## 2017-04-16 DIAGNOSIS — E669 Obesity, unspecified: Secondary | ICD-10-CM | POA: Diagnosis not present

## 2017-04-21 DIAGNOSIS — R260 Ataxic gait: Secondary | ICD-10-CM | POA: Diagnosis not present

## 2017-04-21 DIAGNOSIS — I2510 Atherosclerotic heart disease of native coronary artery without angina pectoris: Secondary | ICD-10-CM | POA: Diagnosis not present

## 2017-04-21 DIAGNOSIS — Z6838 Body mass index (BMI) 38.0-38.9, adult: Secondary | ICD-10-CM | POA: Diagnosis not present

## 2017-04-21 DIAGNOSIS — J449 Chronic obstructive pulmonary disease, unspecified: Secondary | ICD-10-CM | POA: Diagnosis not present

## 2017-04-21 DIAGNOSIS — E669 Obesity, unspecified: Secondary | ICD-10-CM | POA: Diagnosis not present

## 2017-04-21 DIAGNOSIS — I69320 Aphasia following cerebral infarction: Secondary | ICD-10-CM | POA: Diagnosis not present

## 2017-04-21 DIAGNOSIS — R296 Repeated falls: Secondary | ICD-10-CM | POA: Diagnosis not present

## 2017-04-21 DIAGNOSIS — I480 Paroxysmal atrial fibrillation: Secondary | ICD-10-CM | POA: Diagnosis not present

## 2017-04-21 DIAGNOSIS — R69 Illness, unspecified: Secondary | ICD-10-CM | POA: Diagnosis not present

## 2017-04-21 DIAGNOSIS — I10 Essential (primary) hypertension: Secondary | ICD-10-CM | POA: Diagnosis not present

## 2017-04-22 DIAGNOSIS — I480 Paroxysmal atrial fibrillation: Secondary | ICD-10-CM | POA: Diagnosis not present

## 2017-04-22 DIAGNOSIS — R296 Repeated falls: Secondary | ICD-10-CM | POA: Diagnosis not present

## 2017-04-22 DIAGNOSIS — I2510 Atherosclerotic heart disease of native coronary artery without angina pectoris: Secondary | ICD-10-CM | POA: Diagnosis not present

## 2017-04-22 DIAGNOSIS — R260 Ataxic gait: Secondary | ICD-10-CM | POA: Diagnosis not present

## 2017-04-23 DIAGNOSIS — Z6838 Body mass index (BMI) 38.0-38.9, adult: Secondary | ICD-10-CM | POA: Diagnosis not present

## 2017-04-23 DIAGNOSIS — J449 Chronic obstructive pulmonary disease, unspecified: Secondary | ICD-10-CM | POA: Diagnosis not present

## 2017-04-23 DIAGNOSIS — I480 Paroxysmal atrial fibrillation: Secondary | ICD-10-CM | POA: Diagnosis not present

## 2017-04-23 DIAGNOSIS — R260 Ataxic gait: Secondary | ICD-10-CM | POA: Diagnosis not present

## 2017-04-23 DIAGNOSIS — R69 Illness, unspecified: Secondary | ICD-10-CM | POA: Diagnosis not present

## 2017-04-23 DIAGNOSIS — R296 Repeated falls: Secondary | ICD-10-CM | POA: Diagnosis not present

## 2017-04-23 DIAGNOSIS — I2510 Atherosclerotic heart disease of native coronary artery without angina pectoris: Secondary | ICD-10-CM | POA: Diagnosis not present

## 2017-04-23 DIAGNOSIS — I69320 Aphasia following cerebral infarction: Secondary | ICD-10-CM | POA: Diagnosis not present

## 2017-04-23 DIAGNOSIS — E669 Obesity, unspecified: Secondary | ICD-10-CM | POA: Diagnosis not present

## 2017-04-23 DIAGNOSIS — I10 Essential (primary) hypertension: Secondary | ICD-10-CM | POA: Diagnosis not present

## 2017-04-27 DIAGNOSIS — R296 Repeated falls: Secondary | ICD-10-CM | POA: Diagnosis not present

## 2017-04-27 DIAGNOSIS — I10 Essential (primary) hypertension: Secondary | ICD-10-CM | POA: Diagnosis not present

## 2017-04-27 DIAGNOSIS — I69320 Aphasia following cerebral infarction: Secondary | ICD-10-CM | POA: Diagnosis not present

## 2017-04-27 DIAGNOSIS — Z6838 Body mass index (BMI) 38.0-38.9, adult: Secondary | ICD-10-CM | POA: Diagnosis not present

## 2017-04-27 DIAGNOSIS — J449 Chronic obstructive pulmonary disease, unspecified: Secondary | ICD-10-CM | POA: Diagnosis not present

## 2017-04-27 DIAGNOSIS — I2510 Atherosclerotic heart disease of native coronary artery without angina pectoris: Secondary | ICD-10-CM | POA: Diagnosis not present

## 2017-04-27 DIAGNOSIS — E669 Obesity, unspecified: Secondary | ICD-10-CM | POA: Diagnosis not present

## 2017-04-27 DIAGNOSIS — R69 Illness, unspecified: Secondary | ICD-10-CM | POA: Diagnosis not present

## 2017-04-27 DIAGNOSIS — I480 Paroxysmal atrial fibrillation: Secondary | ICD-10-CM | POA: Diagnosis not present

## 2017-04-27 DIAGNOSIS — R260 Ataxic gait: Secondary | ICD-10-CM | POA: Diagnosis not present

## 2017-04-29 DIAGNOSIS — Z6838 Body mass index (BMI) 38.0-38.9, adult: Secondary | ICD-10-CM | POA: Diagnosis not present

## 2017-04-29 DIAGNOSIS — R69 Illness, unspecified: Secondary | ICD-10-CM | POA: Diagnosis not present

## 2017-04-29 DIAGNOSIS — R296 Repeated falls: Secondary | ICD-10-CM | POA: Diagnosis not present

## 2017-04-29 DIAGNOSIS — I480 Paroxysmal atrial fibrillation: Secondary | ICD-10-CM | POA: Diagnosis not present

## 2017-04-29 DIAGNOSIS — J449 Chronic obstructive pulmonary disease, unspecified: Secondary | ICD-10-CM | POA: Diagnosis not present

## 2017-04-29 DIAGNOSIS — E669 Obesity, unspecified: Secondary | ICD-10-CM | POA: Diagnosis not present

## 2017-04-29 DIAGNOSIS — I10 Essential (primary) hypertension: Secondary | ICD-10-CM | POA: Diagnosis not present

## 2017-04-29 DIAGNOSIS — R260 Ataxic gait: Secondary | ICD-10-CM | POA: Diagnosis not present

## 2017-04-29 DIAGNOSIS — I2510 Atherosclerotic heart disease of native coronary artery without angina pectoris: Secondary | ICD-10-CM | POA: Diagnosis not present

## 2017-04-29 DIAGNOSIS — I69320 Aphasia following cerebral infarction: Secondary | ICD-10-CM | POA: Diagnosis not present

## 2017-04-30 IMAGING — CT CT HEAD W/O CM
2 series · 15 of 30 positions shown, 19 images · non-contrast
Comparison: None.

CLINICAL DATA: Code stroke. Right-sided paralysis and aphasia,
acute onset. Initial encounter.

EXAM:
CT HEAD WITHOUT CONTRAST
TECHNIQUE: Contiguous axial images were obtained from the base of the skull
through the vertex without intravenous contrast.

[Series 201: head w/o, idose (1) · axial · non-contrast · 0.43mm/px · z∈[+752,+882]mm · 13 of 32 slices shown, 17 images]
[im 3/32  brain]
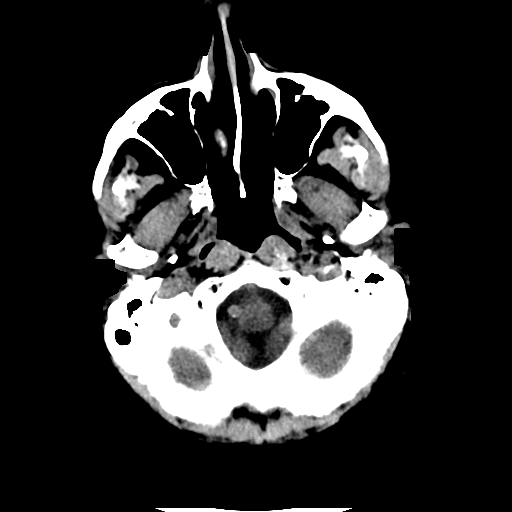
[im 3/32  bone]
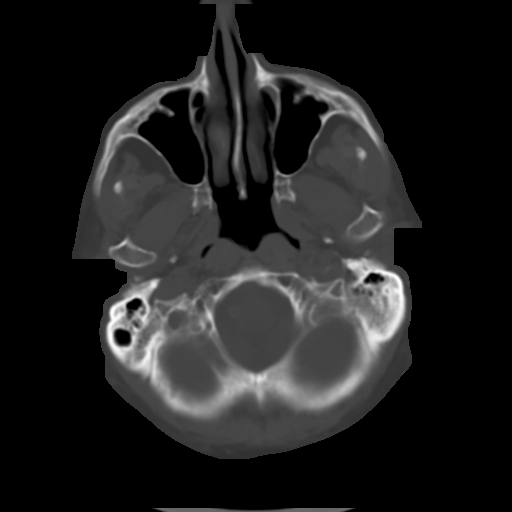
[im 5/32  brain]
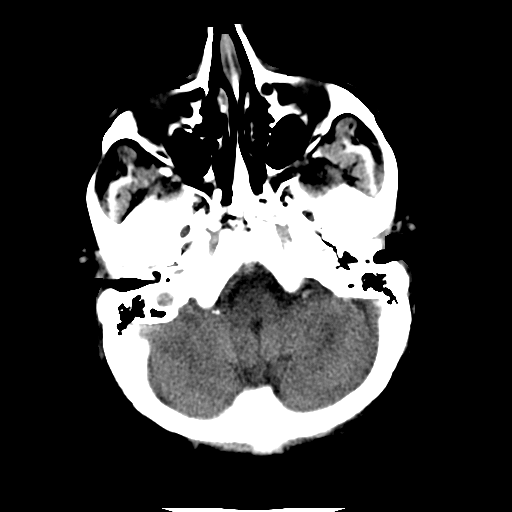
[im 7/32  brain]
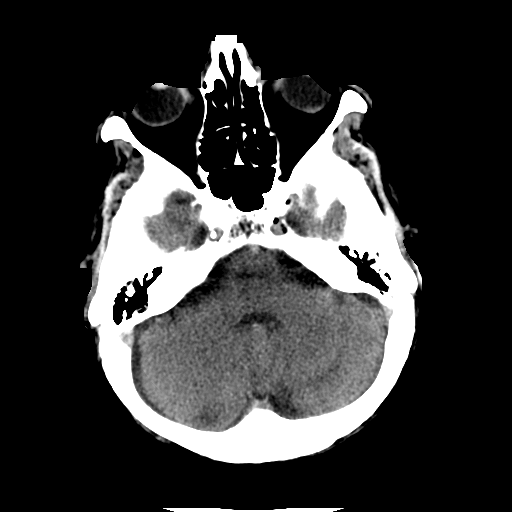
[im 9/32  brain]
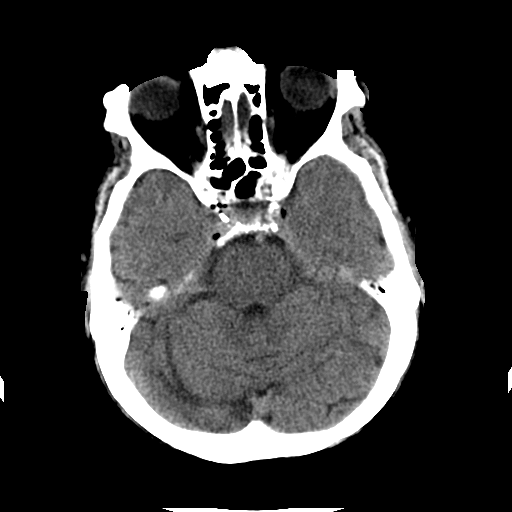
[im 12/32  brain]
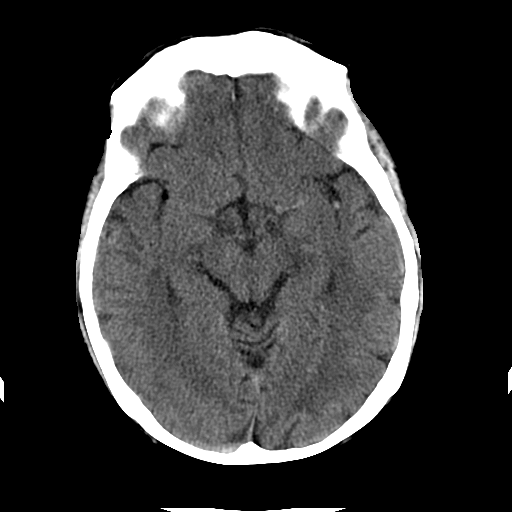
[im 12/32  bone]
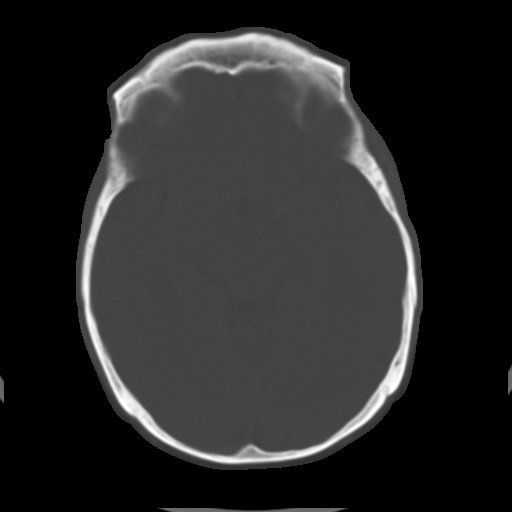
[im 14/32  brain]
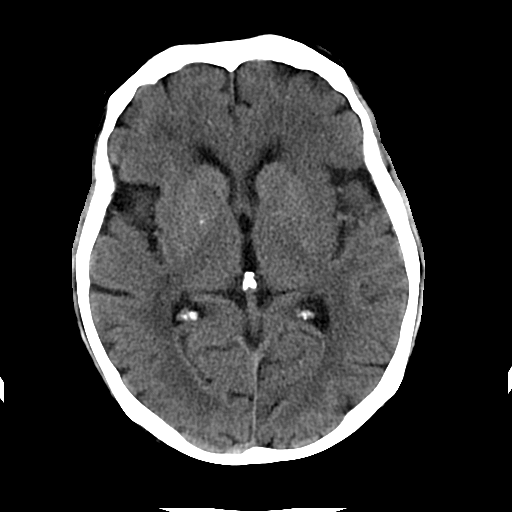
[im 16/32  brain]
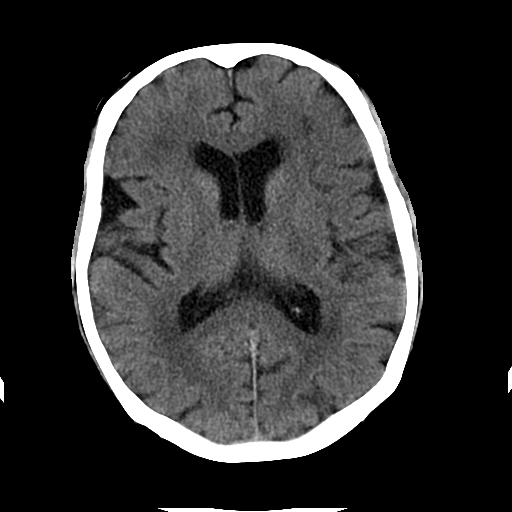
[im 18/32  brain]
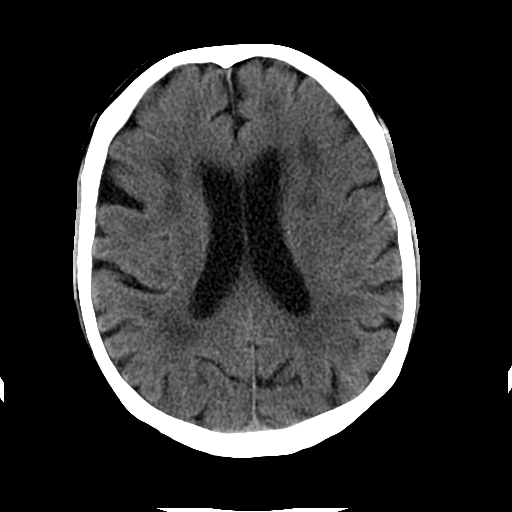
[im 20/32  brain]
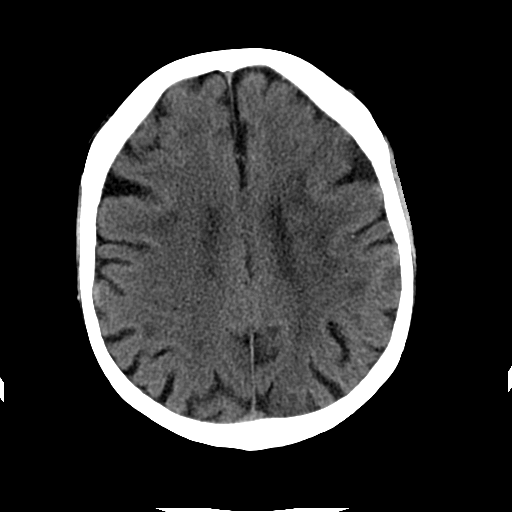
[im 20/32  bone]
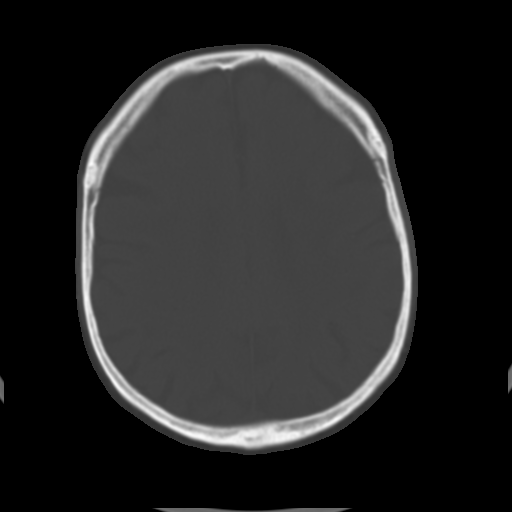
[im 23/32  brain]
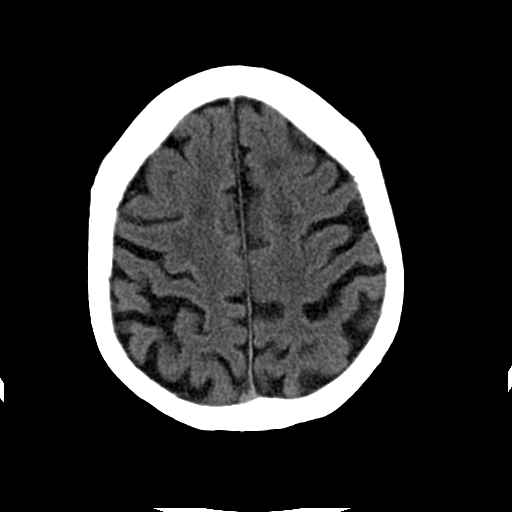
[im 25/32  brain]
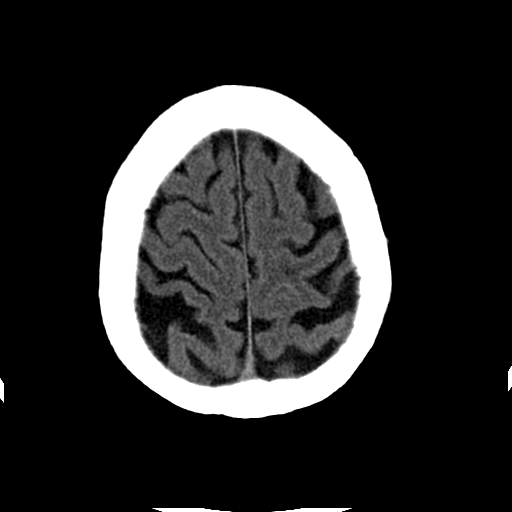
[im 27/32  brain]
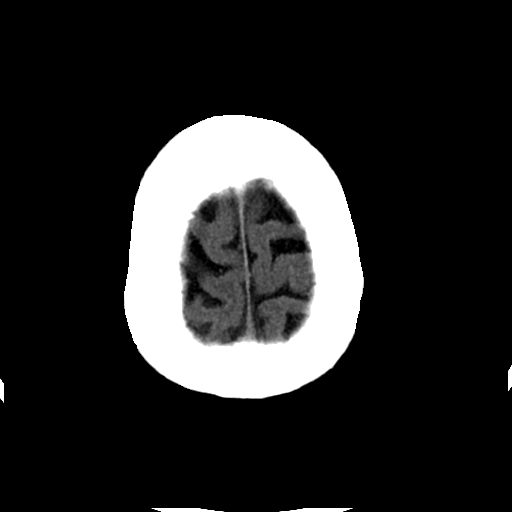
[im 29/32  brain]
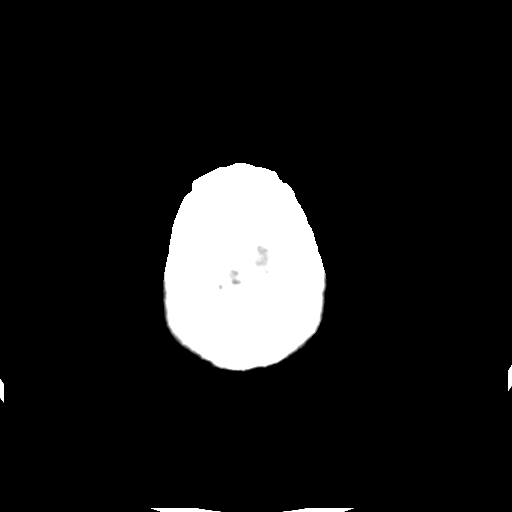
[im 29/32  bone]
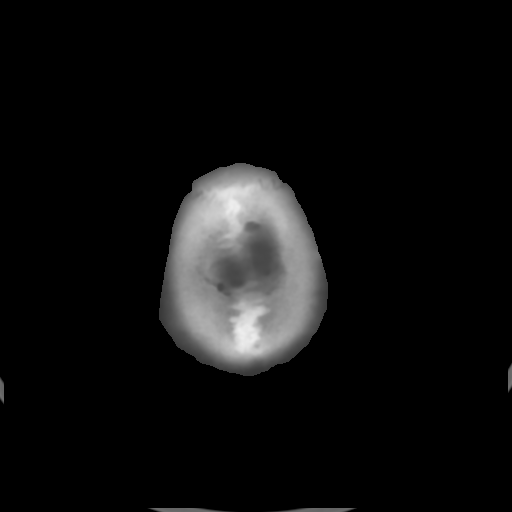

[Series 202: head w/o bone, idose (1) · axial · non-contrast · 0.43mm/px · z∈[+752,+772]mm · 2 of 32 slices shown]
[im 3/32  bone]
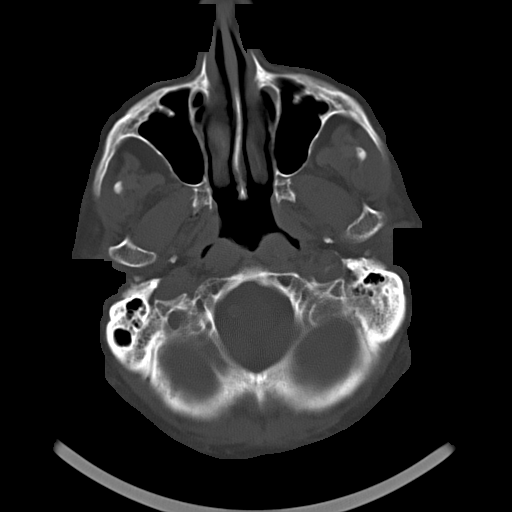
[im 7/32  bone]
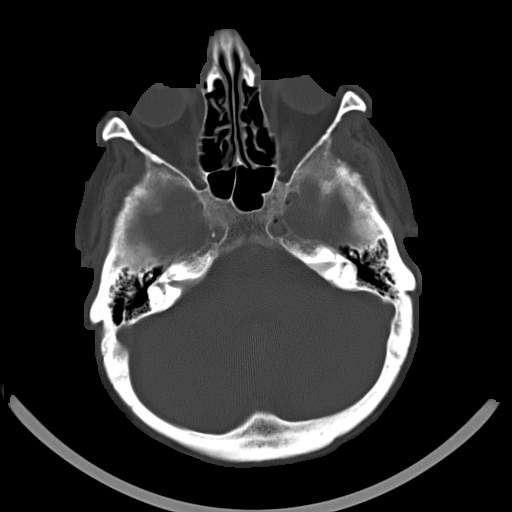

[15 of 30 positions shown; findings below may reference images not displayed]

FINDINGS: There is no evidence of acute infarction, mass lesion, or intra- or
extra-axial hemorrhage on CT.

Prominence of ventricles and sulci reflects mild to moderate
cortical volume loss. Mild cerebellar atrophy is noted. Scattered
periventricular and subcortical white matter change likely reflects
small vessel ischemic microangiopathy.

The brainstem and fourth ventricle are within normal limits. The
basal ganglia are unremarkable in appearance. The cerebral
hemispheres demonstrate grossly normal gray-white differentiation.
No mass effect or midline shift is seen.

There is no evidence of fracture; visualized osseous structures are
unremarkable in appearance. The visualized portions of the orbits
are within normal limits. The paranasal sinuses and mastoid air
cells are well-aerated. No significant soft tissue abnormalities are
seen.
IMPRESSION: 1. No acute intracranial pathology seen on CT.
2. Mild to moderate cortical volume loss and scattered small vessel
ischemic microangiopathy.
These results were called by telephone at the time of interpretation
on 05/20/2015 at [DATE] to Dr. Maitte, who verbally
acknowledged these results.

## 2017-05-01 IMAGING — CR DG ABD PORTABLE 1V
1 series · 1 of 1 positions shown · non-contrast
Comparison: None.

CLINICAL DATA: Orogastric tube placement.  Initial encounter.

EXAM:
PORTABLE ABDOMEN - 1 VIEW

[AP]
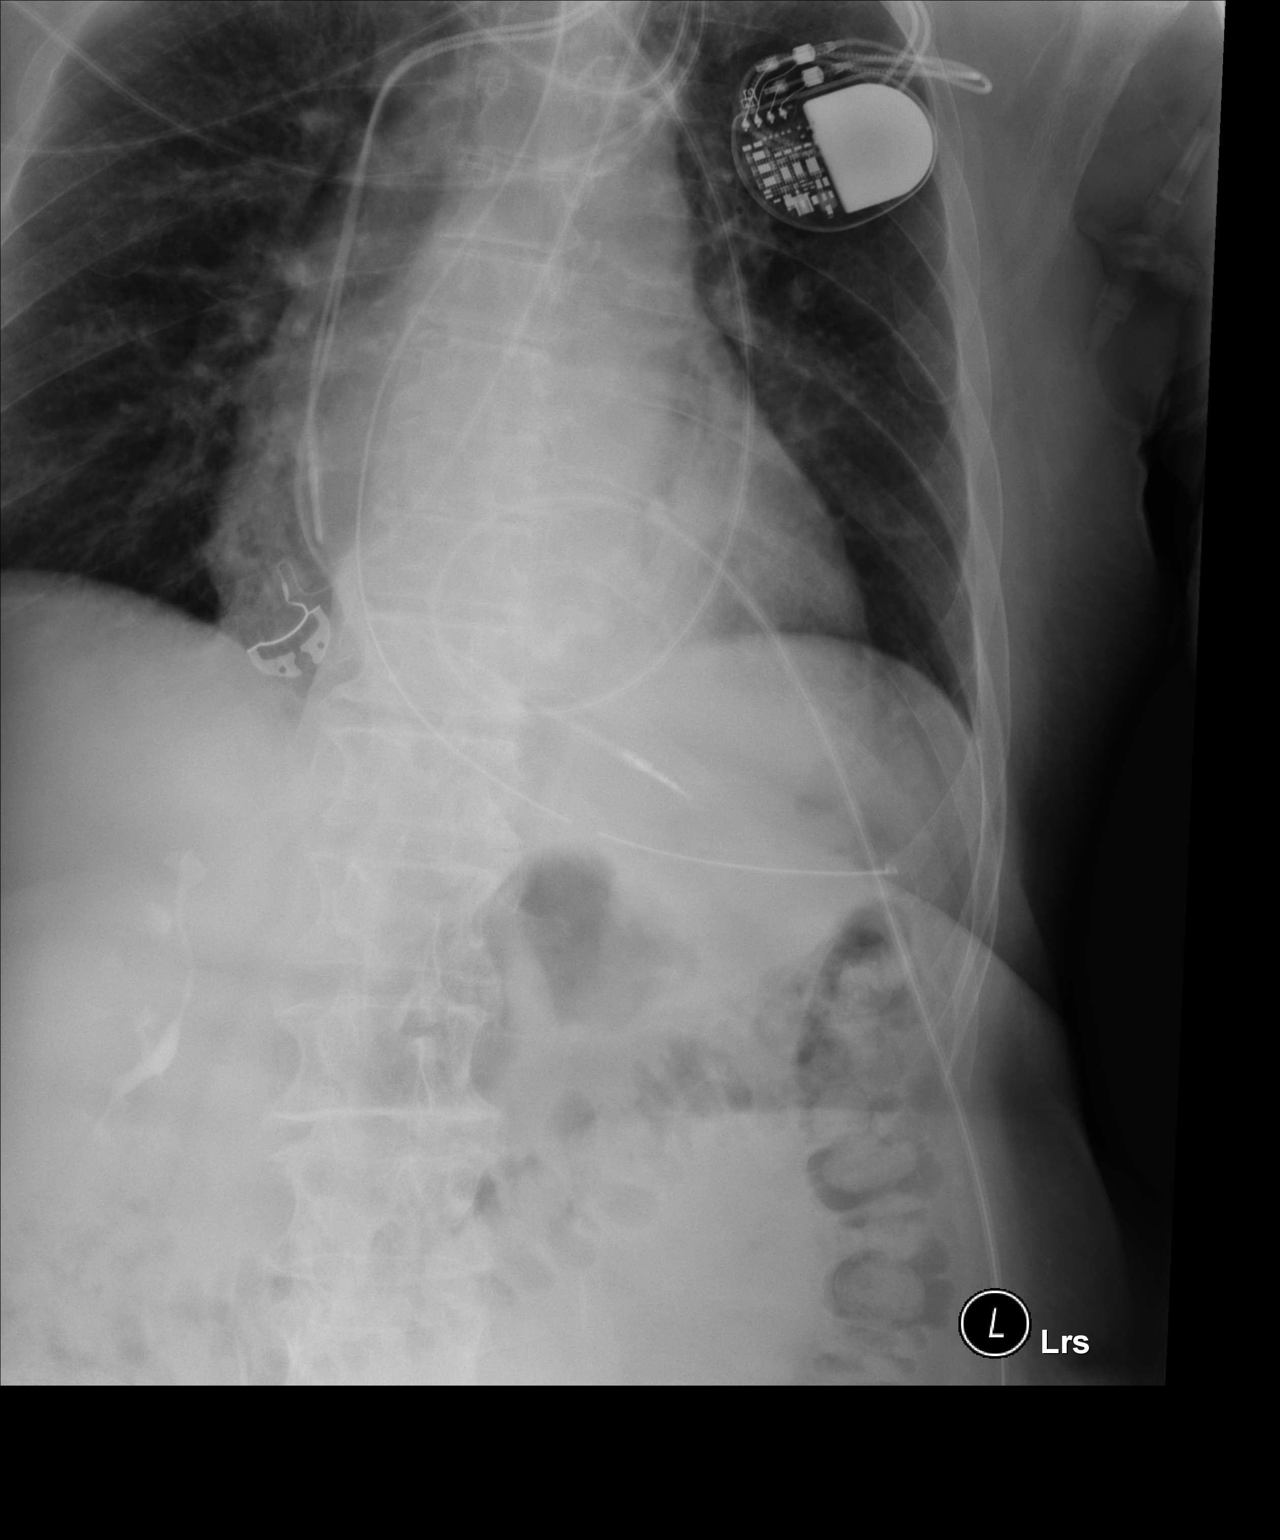

[1 of 1 positions shown; findings below may reference images not displayed]

FINDINGS: The patient's enteric tube is noted ending overlying the body of the
stomach. Its side-port is noted about the fundus of the stomach.

The visualized bowel gas pattern is grossly unremarkable. Contrast
is noted within the right renal collecting system. No free
intra-abdominal air is seen, though evaluation for free air is
limited on a single supine view.

No acute osseous abnormalities are identified. Pacemaker leads are
partially imaged, with a left chest wall pacemaker seen. The
visualized portions of the lungs are grossly clear.
IMPRESSION: Enteric tube noted ending overlying the body of the stomach.

## 2017-05-05 DIAGNOSIS — I700 Atherosclerosis of aorta: Secondary | ICD-10-CM | POA: Diagnosis not present

## 2017-05-05 DIAGNOSIS — R918 Other nonspecific abnormal finding of lung field: Secondary | ICD-10-CM | POA: Diagnosis not present

## 2017-05-05 DIAGNOSIS — H1032 Unspecified acute conjunctivitis, left eye: Secondary | ICD-10-CM | POA: Diagnosis not present

## 2017-05-05 DIAGNOSIS — R600 Localized edema: Secondary | ICD-10-CM | POA: Diagnosis not present

## 2017-05-05 DIAGNOSIS — R062 Wheezing: Secondary | ICD-10-CM | POA: Diagnosis not present

## 2017-05-05 DIAGNOSIS — Z79899 Other long term (current) drug therapy: Secondary | ICD-10-CM | POA: Diagnosis not present

## 2017-05-10 DIAGNOSIS — Z8673 Personal history of transient ischemic attack (TIA), and cerebral infarction without residual deficits: Secondary | ICD-10-CM | POA: Diagnosis not present

## 2017-05-12 DIAGNOSIS — Z79899 Other long term (current) drug therapy: Secondary | ICD-10-CM | POA: Diagnosis not present

## 2017-05-12 DIAGNOSIS — H524 Presbyopia: Secondary | ICD-10-CM | POA: Diagnosis not present

## 2017-05-14 DIAGNOSIS — H524 Presbyopia: Secondary | ICD-10-CM | POA: Diagnosis not present

## 2017-05-14 DIAGNOSIS — H5203 Hypermetropia, bilateral: Secondary | ICD-10-CM | POA: Diagnosis not present

## 2017-05-14 DIAGNOSIS — H52209 Unspecified astigmatism, unspecified eye: Secondary | ICD-10-CM | POA: Diagnosis not present

## 2017-06-03 DIAGNOSIS — E669 Obesity, unspecified: Secondary | ICD-10-CM | POA: Diagnosis not present

## 2017-06-03 DIAGNOSIS — R600 Localized edema: Secondary | ICD-10-CM | POA: Diagnosis not present

## 2017-06-03 DIAGNOSIS — E039 Hypothyroidism, unspecified: Secondary | ICD-10-CM | POA: Diagnosis not present

## 2017-06-03 DIAGNOSIS — E7800 Pure hypercholesterolemia, unspecified: Secondary | ICD-10-CM | POA: Diagnosis not present

## 2017-06-03 DIAGNOSIS — I10 Essential (primary) hypertension: Secondary | ICD-10-CM | POA: Diagnosis not present

## 2017-06-03 DIAGNOSIS — Z79899 Other long term (current) drug therapy: Secondary | ICD-10-CM | POA: Diagnosis not present

## 2017-06-03 DIAGNOSIS — Z6838 Body mass index (BMI) 38.0-38.9, adult: Secondary | ICD-10-CM | POA: Diagnosis not present

## 2017-06-08 ENCOUNTER — Telehealth: Payer: Self-pay | Admitting: Cardiology

## 2017-06-08 NOTE — Telephone Encounter (Signed)
New message   Dr. Belva CromePenner calling aware Dr. Elberta Fortisamnitz is not in the office today.   Pt c/o medication issue:  1. Name of Medication: amiodarone (PACERONE) 200 MG tablet  2. How are you currently taking this medication (dosage and times per day)? Take 200 mg by mouth 2 (two) times daily.  3. Are you having a reaction (difficulty breathing--STAT)? No   4. What is your medication issue? Liver function has double/ triple  in the last 9 months.  Thyroid has become hypothyroid.    MD is asking for Dr. Elberta Fortisamnitz to call back.

## 2017-06-08 NOTE — Telephone Encounter (Signed)
Will forward this request to Dr Elberta Fortisamnitz, for further follow-up with pts other Health Care Provider, Dr. Belva CromePenner. Dr. Len ChildsPenner's number is [phone].  This is in regards to Thyroid function and taking Amiodarone.

## 2017-06-10 DIAGNOSIS — Z8673 Personal history of transient ischemic attack (TIA), and cerebral infarction without residual deficits: Secondary | ICD-10-CM | POA: Diagnosis not present

## 2017-06-10 NOTE — Telephone Encounter (Addendum)
Dr Elberta Fortisamnitz attempted to reach Dr. Belva CromePenner on Tuesday, unsuccessfully. He will be reaching out to this physician today

## 2017-06-11 NOTE — Telephone Encounter (Signed)
Pt has OV w/ Camnitz 4/23

## 2017-06-15 ENCOUNTER — Ambulatory Visit: Payer: Medicare HMO | Admitting: Cardiology

## 2017-06-15 ENCOUNTER — Encounter: Payer: Self-pay | Admitting: Cardiology

## 2017-06-15 VITALS — BP 108/66 | HR 66 | Ht 69.0 in | Wt 227.0 lb

## 2017-06-15 DIAGNOSIS — I480 Paroxysmal atrial fibrillation: Secondary | ICD-10-CM

## 2017-06-15 DIAGNOSIS — I495 Sick sinus syndrome: Secondary | ICD-10-CM | POA: Diagnosis not present

## 2017-06-15 DIAGNOSIS — I2510 Atherosclerotic heart disease of native coronary artery without angina pectoris: Secondary | ICD-10-CM

## 2017-06-15 LAB — CUP PACEART INCLINIC DEVICE CHECK
Battery Impedance: 199 Ohm
Battery Remaining Longevity: 120 mo
Brady Statistic AP VP Percent: 0 %
Brady Statistic AP VS Percent: 100 %
Date Time Interrogation Session: 20190423112111
Implantable Lead Implant Date: 20080417
Implantable Lead Location: 753859
Implantable Lead Model: 5076
Implantable Lead Model: 5076
Implantable Pulse Generator Implant Date: 20161125
Lead Channel Impedance Value: 583 Ohm
Lead Channel Pacing Threshold Amplitude: 0.75 V
Lead Channel Pacing Threshold Amplitude: 0.75 V
Lead Channel Pacing Threshold Pulse Width: 0.4 ms
Lead Channel Pacing Threshold Pulse Width: 0.4 ms
Lead Channel Setting Pacing Amplitude: 1.5 V
Lead Channel Setting Pacing Pulse Width: 0.4 ms
MDC IDC LEAD IMPLANT DT: 20080417
MDC IDC LEAD LOCATION: 753860
MDC IDC MSMT BATTERY VOLTAGE: 2.78 V
MDC IDC MSMT LEADCHNL RA IMPEDANCE VALUE: 469 Ohm
MDC IDC MSMT LEADCHNL RA PACING THRESHOLD AMPLITUDE: 0.75 V
MDC IDC MSMT LEADCHNL RA PACING THRESHOLD PULSEWIDTH: 0.4 ms
MDC IDC MSMT LEADCHNL RV PACING THRESHOLD AMPLITUDE: 0.75 V
MDC IDC MSMT LEADCHNL RV PACING THRESHOLD PULSEWIDTH: 0.4 ms
MDC IDC MSMT LEADCHNL RV SENSING INTR AMPL: 22.4 mV
MDC IDC SET LEADCHNL RV PACING AMPLITUDE: 2 V
MDC IDC SET LEADCHNL RV SENSING SENSITIVITY: 5.6 mV
MDC IDC STAT BRADY AS VP PERCENT: 0 %
MDC IDC STAT BRADY AS VS PERCENT: 0 %

## 2017-06-15 MED ORDER — DRONEDARONE HCL 400 MG PO TABS: 400.0000 mg | ORAL_TABLET | Freq: Two times a day (BID) | ORAL | 5 refills | Status: DC

## 2017-06-15 NOTE — Progress Notes (Signed)
Electrophysiology Office Note   Date:  06/15/2017   ID:  Terrie Grajales, DOB [birth year], MRN [id number]  PCP:  Alinda Deem, MD  Cardiologist:  Dulce Sellar Primary Electrophysiologist:  Shaely Gadberry Jorja Loa, MD    Chief Complaint  Patient presents with  . Pacemaker Check    PAF/Sick sinus syndrome     History of Present Illness: Okie Bogacz is a 77 y.o. male who is being seen today for the evaluation of atrial fibrillation at the request of Alinda Deem, MD. Presenting today for electrophysiology evaluation. He has history of coronary disease status post an STEMI with PCI on 10/13/2014, paroxysmal atrial fibrillation, ablation 01/13/2015 of a left-sided atrial flutter, hypertension, CVA, on amiodarone and with a pacemaker in place. He has had multiple cardioversions while on amiodarone. He has had 2 cardioversions within the last 6 months. When he goes in atrial fibrillation, he has symptoms of fatigue and shortness of breath. He has had to be hospitalized 2 times prior due to his symptoms. He otherwise has no further symptoms when he is in normal rhythm. He is feeling well today without major complaint.   Today, denies symptoms of palpitations, chest pain, shortness of breath, orthopnea, PND, lower extremity edema, claudication, dizziness, presyncope, syncope, bleeding, or neurologic sequela. The patient is tolerating medications without difficulties.  He was noted to have elevated LFTs and TSH on multiple checks.  He is presenting today for discussion of alternative antiarrhythmics.   Past Medical History:  Diagnosis Date  . CAD S/P percutaneous coronary angioplasty Aug 2016   Dx1 DES, no other significant dis  . Chronic anticoagulation    Eliquis  . Dizziness   . Dyspnea   . Hyperlipidemia   . Hypertension   . Hypertrophic cardiomyopathy (HCC)   . PAF (paroxysmal atrial fibrillation) (HCC)   . SSS (sick sinus syndrome) (HCC)   . Tobacco use disorder    Past Surgical  History:  Procedure Laterality Date  . ABLATION  Nov 2016   [facility]  . back surgery x 2    . INSERT / REPLACE / REMOVE PACEMAKER  2008   MDT  . RADIOLOGY WITH ANESTHESIA N/A 05/20/2015   Procedure: RADIOLOGY WITH ANESTHESIA;  Surgeon: Medication Radiologist, MD;  Location: [facility] OR;  Service: Radiology;  Laterality: N/A;  . rotator cuff surgery       Current Outpatient Medications  Medication Sig Dispense Refill  . apixaban (ELIQUIS) 5 MG TABS tablet Take 1 tablet (5 mg total) by mouth 2 (two) times daily. 60 tablet 1  . clopidogrel (PLAVIX) 75 MG tablet Take 1 tablet (75 mg total) by mouth daily with breakfast. 30 tablet 1  . furosemide (LASIX) 20 MG tablet Take 20 mg by mouth daily.    . metoprolol succinate (TOPROL-XL) 25 MG 24 hr tablet Take 12.5 mg by mouth daily.    . Multiple Vitamins-Minerals (MULTIVITAMIN WITH MINERALS) tablet Take 1 tablet by mouth daily.    . Omega-3 Fatty Acids (FISH OIL) 1000 MG CPDR Take 1,000 mg by mouth daily.    Marland Kitchen PARoxetine (PAXIL) 10 MG tablet Take 10 mg by mouth daily.    . rosuvastatin (CRESTOR) 40 MG tablet Take 40 mg by mouth daily.    . tamsulosin (FLOMAX) 0.4 MG CAPS capsule Take 0.4 mg by mouth daily.  2  . [START ON 07/15/2017] dronedarone (MULTAQ) 400 MG tablet Take 1 tablet (400 mg total) by mouth 2 (two) times daily with a meal. 60 tablet 5   No  current facility-administered medications for this visit.     Allergies:   Bee venom   Social History:  The patient  reports that he has been smoking cigarettes.  He has been smoking about 0.50 packs per day. He has never used smokeless tobacco. He reports that he does not drink alcohol or use drugs.   Family History:  The patient's family history includes Cancer (age of onset: 3548) in his sister.    ROS:  Please see the history of present illness.   Otherwise, review of systems is positive for cough, DOE.   All other systems are reviewed and negative.   PHYSICAL EXAM: VS:  BP 108/66   Pulse 66    Ht 5\' 9"  (1.753 m)   Wt 227 lb (103 kg)   BMI 33.52 kg/m  , BMI Body mass index is 33.52 kg/m. GEN: Well nourished, well developed, in no acute distress  HEENT: normal  Neck: no JVD, carotid bruits, or masses Cardiac: RRR; no murmurs, rubs, or gallops,no edema  Respiratory:  clear to auscultation bilaterally, normal work of breathing GI: soft, nontender, nondistended, + BS MS: no deformity or atrophy  Skin: warm and dry, device site well healed Neuro:  Strength and sensation are intact Psych: euthymic mood, full affect  EKG:  EKG is ordered today. Personal review of the ekg ordered shows sinus rhythm, QTc 509 msec, diffuse ST changes  Personal review of the device interrogation today. Results in Paceart    Recent Labs: 10/19/2016: ALT 61; TSH 7.590    Lipid Panel     Component Value Date/Time   CHOL 84 05/21/2015 0522   TRIG 59 05/21/2015 0522   HDL 29 (L) 05/21/2015 0522   CHOLHDL 2.9 05/21/2015 0522   VLDL 12 05/21/2015 0522   LDLCALC 43 05/21/2015 0522     Wt Readings from Last 3 Encounters:  06/15/17 227 lb (103 kg)  10/19/16 240 lb (108.9 kg)  07/15/16 226 lb 6.4 oz (102.7 kg)      Other studies Reviewed: Additional studies/ records that were reviewed today include: TTE 05/23/15  Review of the above records today demonstrates:  - Left ventricle: The cavity size was normal. There was moderate   concentric hypertrophy. Systolic function was normal. The   estimated ejection fraction was in the range of 60% to 65%. Wall   motion was normal; there were no regional wall motion   abnormalities. Diastolic dysfunction with elevated LV filling   pressure. - Aortic valve: Mildly calcified leaflets. There was no stenosis.   There was trivial regurgitation. - Mitral valve: Heavily calcified annulus. Mildly thickened   leaflets . Mild stenosis. There was moderate regurgitation. - Left atrium: Severely dilated at 62 ml/m2. - Tricuspid valve: There was moderate  regurgitation. - Pulmonary arteries: PA peak pressure: 54 mm Hg (S). - Systemic veins: The IVC measures >2.1 cm, but collapses more than   50%, suggesting an RA pressure of 8 mmHG.  02/07/16 Myoview  Nuclear stress EF: 55%.  The study is normal.  This is a low risk study.  The left ventricular ejection fraction is normal (55-65%).  There was no ST segment deviation noted during stress.  ASSESSMENT AND PLAN:  1.  Paroxysmal atrial fibrillation: On Eliquis.  He is on amiodarone maintaining atrially paced rhythm.  Unfortunately his labs show increasing LFTs and TSH.  We Chelli Yerkes plan to stop his amiodarone today.  We Chesley Veasey start him on Multaq.  TC is mildly prolonged and thus we  Mora Pedraza have a washout.  This patients CHA2DS2-VASc Score and unadjusted Ischemic Stroke Rate (% per year) is equal to 9.7 % stroke rate/year from a score of 6  Above score calculated as 1 point each if present [CHF, HTN, DM, Vascular=MI/PAD/Aortic Plaque, Age if 65-74, or Male] Above score calculated as 2 points each if present [Age > 75, or Stroke/TIA/TE]   2. Hypertension: Well-controlled today.  No changes.  3. Coronary artery disease status post an STEMI and drug-eluting stent placement: No current chest pain.  Continue current management.  4. Sick sinus syndrome: Atrial dependent.  No changes at this time.    Current medicines are reviewed at length with the patient today.   The patient does not have concerns regarding his medicines.  The following changes were made today: Off amiodarone, start Multaq  Labs/ tests ordered today include:  Orders Placed This Encounter  Procedures  . CUP PACEART INCLINIC DEVICE CHECK  . EKG 12-Lead     Disposition:   FU with Achilles Neville 6 months  Signed, Jyles Sontag Jorja Loa, MD  06/15/2017 11:21 AM     [facility] [address] [place] [address] [phone] (office) [phone] (fax)

## 2017-06-15 NOTE — Patient Instructions (Signed)
Medication Instructions:  Your physician has recommended you make the following change in your medication:  1. STOP Amiodarone 2. START on 07/15/2017 -- start Multaq 400 mg twice daily  Labwork: None ordered  Testing/Procedures: None ordered  Follow-Up: Your physician wants you to follow-up in: 6 months with Dr. Elberta Fortisamnitz.  You will receive a reminder letter in the mail two months in advance. If you don't receive a letter, please call our office to schedule the follow-up appointment.  * If you need a refill on your cardiac medications before your next appointment, please call your pharmacy.   *Please note that any paperwork needing to be filled out by the provider will need to be addressed at the front desk prior to seeing the provider. Please note that any FMLA, disability or other documents regarding health condition is subject to a $25.00 charge that must be received prior to completion of paperwork in the form of a money order or check.  Thank you for choosing [facility]!!   Dory HornSherri Kristina Bertone, RN [phone]  Any Other Special Instructions Will Be Listed Below (If Applicable).  Dronedarone tablets What is this medicine? DRONEDARONE (droe NE da rone) is an antiarrhythmic drug. It helps make your heart beat regularly. This medicine may be used for other purposes; ask your health care provider or pharmacist if you have questions. COMMON BRAND NAME(S): Multaq What should I tell my health care provider before I take this medicine? They need to know if you have any of these conditions: -heart failure -history of irregular heartbeat -liver disease -liver or lung problems with the past use of amiodarone -low levels of magnesium in the blood -low levels of potassium in the blood -other heart disease -an unusual or allergic reaction to dronedarone, other medicines, foods, dyes, or preservatives -pregnant or trying to get pregnant -breast-feeding How should I use this  medicine? Take this medicine by mouth with a glass of water. Follow the directions on the prescription label. Take one tablet with the morning meal and one tablet with the evening meal. Do not take your medicine more often than directed. Do not stop taking except on the advice of your doctor or health care professional. A special MedGuide will be given to you by the pharmacist with each prescription and refill. Be sure to read this information carefully each time. Talk to your pediatrician regarding the use of this medicine in children. Special care may be needed. Overdosage: If you think you have taken too much of this medicine contact a poison control center or emergency room at once. NOTE: This medicine is only for you. Do not share this medicine with others. What if I miss a dose? If you miss a dose, take it as soon as you can. If it is almost time for your next dose, take only that dose. Do not take double or extra doses. What may interact with this medicine? Do not take this medicine with any of the following medications: -arsenic trioxide -certain antibiotics like clarithromycin, erythromycin, pentamidine, telithromycin, troleandomycin -certain medicines for depression like tricyclic antidepressants -certain medicines for fungal infections like fluconazole, itraconazole, ketoconazole, posaconazole, voriconazole -certain medicines for irregular heart beat like amiodarone, disopyramide, dofetilide, flecainide, ibutilide, quinidine, propafenone, sotalol -certain medicines for malaria like chloroquine, halofantrine -cisapride -cyclosporine -droperidol -haloperidol -methadone -other medicines that prolong the QT interval (cause an abnormal heart rhythm) -pimozide -nefazodone -phenothiazines like chlorpromazine, mesoridazine, prochlorperazine, thioridazine -ritonavir -ziprasidone This medicine may also interact with the following medications: -certain medicines for blood  pressure, heart  disease, or irregular heart beat like diltiazem, metoprolol, propranolol, verapamil -certain medicines for cholesterol like atorvastatin, lovastatin, simvastatin -certain medicines for seizures like carbamazepine, phenobarbital, phenytoin -digoxin -grapefruit juice -rifampin -sirolimus -St. John's Wort -tacrolimus This list may not describe all possible interactions. Give your health care provider a list of all the medicines, herbs, non-prescription drugs, or dietary supplements you use. Also tell them if you smoke, drink alcohol, or use illegal drugs. Some items may interact with your medicine. What should I watch for while using this medicine? Your condition will be monitored closely when you first begin therapy. Often, this drug is first started in a hospital or other monitored health care setting. Once you are on maintenance therapy, visit your doctor or health care professional for regular checks on your progress. Because your condition and use of this medicine carry some risk, it is a good idea to carry an identification card, necklace or bracelet with details of your condition, medications, and doctor or health care professional. Bonita Quin may get drowsy or dizzy. Do not drive, use machinery, or do anything that needs mental alertness until you know how this medicine affects you. Do not stand or sit up quickly, especially if you are an older patient. This reduces the risk of dizzy or fainting spells. What side effects may I notice from receiving this medicine? Side effects that you should report to your doctor or health care professional as soon as possible: -allergic reactions like skin rash, itching or hives, swelling of the face, lips, or tongue -breathing problems -cough -dark urine -fast, irregular heartbeat -general ill feeling or flu-like symptoms -light-colored stools -loss of appetite, nausea -right upper belly pain -slow heartbeat -stomach pain -swelling of the legs or  ankles -unusually weak or tired -weight gain -yellowing of the eyes or skin Side effects that usually do not require medical attention (report to your doctor or health care professional if they continue or are bothersome): -nausea -vomiting -stomach pain This list may not describe all possible side effects. Call your doctor for medical advice about side effects. You may report side effects to FDA at 1-800-FDA-1088. Where should I keep my medicine? Keep out of the reach of children. Store at room temperature between 15 and 30 degrees C (59 and 86 degrees F). Throw away any unused medicine after the expiration date. NOTE: This sheet is a summary. It may not cover all possible information. If you have questions about this medicine, talk to your doctor, pharmacist, or health care provider.  2018 Elsevier/Gold Standard (2015-03-14 12:43:06)

## 2017-06-18 ENCOUNTER — Encounter: Payer: Self-pay | Admitting: Cardiology

## 2017-06-21 DIAGNOSIS — K7689 Other specified diseases of liver: Secondary | ICD-10-CM | POA: Diagnosis not present

## 2017-06-21 DIAGNOSIS — R945 Abnormal results of liver function studies: Secondary | ICD-10-CM | POA: Diagnosis not present

## 2017-06-22 ENCOUNTER — Encounter: Payer: Medicare HMO | Admitting: Cardiology

## 2017-07-10 DIAGNOSIS — Z8673 Personal history of transient ischemic attack (TIA), and cerebral infarction without residual deficits: Secondary | ICD-10-CM | POA: Diagnosis not present

## 2017-08-10 DIAGNOSIS — Z8673 Personal history of transient ischemic attack (TIA), and cerebral infarction without residual deficits: Secondary | ICD-10-CM | POA: Diagnosis not present

## 2017-08-31 DIAGNOSIS — R410 Disorientation, unspecified: Secondary | ICD-10-CM | POA: Diagnosis not present

## 2017-08-31 DIAGNOSIS — N3001 Acute cystitis with hematuria: Secondary | ICD-10-CM | POA: Diagnosis not present

## 2017-08-31 DIAGNOSIS — R32 Unspecified urinary incontinence: Secondary | ICD-10-CM | POA: Diagnosis not present

## 2017-08-31 DIAGNOSIS — R531 Weakness: Secondary | ICD-10-CM | POA: Diagnosis not present

## 2017-08-31 DIAGNOSIS — B9689 Other specified bacterial agents as the cause of diseases classified elsewhere: Secondary | ICD-10-CM | POA: Diagnosis not present

## 2017-08-31 DIAGNOSIS — R4182 Altered mental status, unspecified: Secondary | ICD-10-CM | POA: Diagnosis not present

## 2017-09-09 DIAGNOSIS — Z8673 Personal history of transient ischemic attack (TIA), and cerebral infarction without residual deficits: Secondary | ICD-10-CM | POA: Diagnosis not present

## 2017-09-14 ENCOUNTER — Telehealth: Payer: Self-pay | Admitting: Cardiology

## 2017-09-14 ENCOUNTER — Encounter: Payer: Medicare HMO | Admitting: *Deleted

## 2017-09-14 NOTE — Telephone Encounter (Signed)
LMOVM reminding pt to send remote transmission.   

## 2017-09-15 ENCOUNTER — Encounter: Payer: Self-pay | Admitting: Cardiology

## 2017-10-10 DIAGNOSIS — Z8673 Personal history of transient ischemic attack (TIA), and cerebral infarction without residual deficits: Secondary | ICD-10-CM | POA: Diagnosis not present

## 2017-10-23 ENCOUNTER — Encounter: Payer: Self-pay | Admitting: Cardiology

## 2017-10-23 DIAGNOSIS — I429 Cardiomyopathy, unspecified: Secondary | ICD-10-CM | POA: Diagnosis not present

## 2017-10-23 DIAGNOSIS — R079 Chest pain, unspecified: Secondary | ICD-10-CM | POA: Diagnosis not present

## 2017-10-23 DIAGNOSIS — M7989 Other specified soft tissue disorders: Secondary | ICD-10-CM | POA: Diagnosis not present

## 2017-10-23 DIAGNOSIS — R4182 Altered mental status, unspecified: Secondary | ICD-10-CM | POA: Diagnosis not present

## 2017-10-23 DIAGNOSIS — R296 Repeated falls: Secondary | ICD-10-CM | POA: Diagnosis not present

## 2017-10-23 DIAGNOSIS — M79661 Pain in right lower leg: Secondary | ICD-10-CM | POA: Diagnosis not present

## 2017-10-23 DIAGNOSIS — R062 Wheezing: Secondary | ICD-10-CM | POA: Diagnosis not present

## 2017-10-23 DIAGNOSIS — R4181 Age-related cognitive decline: Secondary | ICD-10-CM | POA: Diagnosis not present

## 2017-10-23 DIAGNOSIS — I482 Chronic atrial fibrillation: Secondary | ICD-10-CM | POA: Diagnosis not present

## 2017-10-23 DIAGNOSIS — Z950 Presence of cardiac pacemaker: Secondary | ICD-10-CM | POA: Diagnosis not present

## 2017-11-10 DIAGNOSIS — Z8673 Personal history of transient ischemic attack (TIA), and cerebral infarction without residual deficits: Secondary | ICD-10-CM | POA: Diagnosis not present

## 2017-12-02 DIAGNOSIS — I10 Essential (primary) hypertension: Secondary | ICD-10-CM | POA: Diagnosis not present

## 2017-12-02 DIAGNOSIS — I422 Other hypertrophic cardiomyopathy: Secondary | ICD-10-CM | POA: Diagnosis not present

## 2017-12-02 DIAGNOSIS — E039 Hypothyroidism, unspecified: Secondary | ICD-10-CM | POA: Diagnosis not present

## 2017-12-02 DIAGNOSIS — Z9181 History of falling: Secondary | ICD-10-CM | POA: Diagnosis not present

## 2017-12-02 DIAGNOSIS — Z0000 Encounter for general adult medical examination without abnormal findings: Secondary | ICD-10-CM | POA: Diagnosis not present

## 2017-12-02 DIAGNOSIS — Z7901 Long term (current) use of anticoagulants: Secondary | ICD-10-CM | POA: Diagnosis not present

## 2017-12-02 DIAGNOSIS — I714 Abdominal aortic aneurysm, without rupture: Secondary | ICD-10-CM | POA: Diagnosis not present

## 2017-12-02 DIAGNOSIS — Z79899 Other long term (current) drug therapy: Secondary | ICD-10-CM | POA: Diagnosis not present

## 2017-12-02 DIAGNOSIS — Z23 Encounter for immunization: Secondary | ICD-10-CM | POA: Diagnosis not present

## 2017-12-02 DIAGNOSIS — E7800 Pure hypercholesterolemia, unspecified: Secondary | ICD-10-CM | POA: Diagnosis not present

## 2017-12-10 DIAGNOSIS — Z8673 Personal history of transient ischemic attack (TIA), and cerebral infarction without residual deficits: Secondary | ICD-10-CM | POA: Diagnosis not present

## 2017-12-16 ENCOUNTER — Encounter: Payer: Self-pay | Admitting: Cardiology

## 2018-01-10 DIAGNOSIS — Z8673 Personal history of transient ischemic attack (TIA), and cerebral infarction without residual deficits: Secondary | ICD-10-CM | POA: Diagnosis not present

## 2018-01-18 DIAGNOSIS — R569 Unspecified convulsions: Secondary | ICD-10-CM

## 2018-01-18 DIAGNOSIS — I119 Hypertensive heart disease without heart failure: Secondary | ICD-10-CM | POA: Insufficient documentation

## 2018-01-18 DIAGNOSIS — E7801 Familial hypercholesterolemia: Secondary | ICD-10-CM

## 2018-01-18 HISTORY — DX: Unspecified convulsions: R56.9

## 2018-01-18 HISTORY — DX: Hypertensive heart disease without heart failure: I11.9

## 2018-01-18 HISTORY — DX: Familial hypercholesterolemia: E78.01

## 2018-01-18 NOTE — Progress Notes (Signed)
Cardiology Office Note:    Date:  01/19/2018   ID:  Luis Robinson, DOB 03-Jan-1941, MRN [id number]  PCP:  Alinda Deem, MD  Cardiologist:  Norman Herrlich, MD    Referring MD: Alinda Deem, MD    ASSESSMENT:    1. Coronary artery disease involving native coronary artery of native heart with angina pectoris (HCC)   2. Hypertensive heart disease with chronic diastolic congestive heart failure (HCC)   3. Chronic anticoagulation-Eliquis prior to adm   4. Cardiac pacemaker in situ-MDT- implanted 2008   5. Dyslipidemia   6. Hypertrophic cardiomegaly   7. AAA (abdominal aortic aneurysm) without rupture (HCC)   8. PAF (paroxysmal atrial fibrillation) (HCC)   9. Pacemaker    PLAN:    In order of problems listed above:  1. Stable CAD having no anginal discomfort New [place] Heart Association class I in view of his comorbidities I would not pursue an ischemic evaluation continue medical treatment including clopidogrel beta-blocker and high intensity statin 2. Heart failure is mildly decompensated he will double his diuretic I suspect his hypotension is due to degenerative neurologic disease and I told her if symptomatic hypotension is an issue I put him on midodrine his daughter will continue to monitor 3. Continue anticoagulant 4. Stable followed in our device clinic 5. Stable lipids are at target continue his high intensity statin 6. Stable asymptomatic 7. At this time I would not pursue evaluation his abdominal aorta 8. Improved maintaining sinus rhythm continue Multaq 9. Stable    Next appointment: 6 months   Medication Adjustments/Labs and Tests Ordered: Current medicines are reviewed at length with the patient today.  Concerns regarding medicines are outlined above.  Orders Placed This Encounter  Procedures  . EKG 12-Lead   Meds ordered this encounter  Medications  . furosemide (LASIX) 20 MG tablet    Sig: Take 1 tablet (20 mg total) by mouth 2 (two) times daily.   Dispense:  60 tablet    Refill:  6    Chief Complaint  Patient presents with  . Follow-up  . Coronary Artery Disease  . Congestive Heart Failure  . Hypertension  . Atrial Fibrillation    History of Present Illness:    Luis Robinson is a 77 y.o. male with a hx of coronary artery disease with non-ST elevation MI PCI and drug-eluting stent to LAD 10/09/2014 atrial fibrillation with EP ablation of left-sided atrial flutter 01/23/2015 hypertension previous stroke pacemaker he is on amiodarone.  He was last seen 06/15/2017 by Dr. Elberta Fortis for atrial fibrillation.  He has had multiple cardioversions on amiodarone to try to preserve sinus rhythm and was transition from amiodarone to Multaq Compliance with diet, lifestyle and medications: Yes  He is now lives with him supervised by his daughter.  He has repeated episodes of hypotension when he goes to emergency rooms but he is asymptomatic and I told her if it became a problem we could start him on midodrine I suspect is due to CNS degenerative disease.  He has had no angina palpitation or syncope but she notices him with increasing edema and shortness of breath.  Today's in decompensated heart failure will double his diuretic continue his current anticoagulant as well as clopidogrel with CAD and his high intensity statin.  Recent labs done his PCP office shows cholesterol 105 HDL 43 LDL 40 creatinine 1.50 and he maintains sinus rhythm on Multaq. Past Medical History:  Diagnosis Date  . Acute blood loss anemia   . Aphasia  due to stroke 05/25/2015  . Atrial fibrillation with RVR (HCC) 05/22/2015  . Bowel incontinence   . CAD S/P percutaneous coronary angioplasty Aug 2016   Dx1 DES, no other significant dis  . Cardiac pacemaker in situ-MDT- implanted 2008 05/22/2015  . Chronic anticoagulation    Eliquis  . Coronary artery disease involving native coronary artery of native heart with angina pectoris (HCC) 10/30/2014   Overview:  PCI and stent diagonal  , Aug 2016  . CVA (cerebral infarction)   . CVA due to occlusion of cerebral artery- treated with LMCA stent 05/20/15   . Dizziness   . Dyslipidemia 05/22/2015  . Dyspnea   . Essential hypertension 07/09/2015  . Familial hypercholesterolemia 01/18/2018  . History of back surgery   . History of ETT   . Hyperlipidemia   . Hyperlipidemia, unspecified 10/09/2014  . Hypertension   . Hypertensive heart disease 01/18/2018  . Hypertrophic cardiomyopathy (HCC)   . Hypertrophic nonobstructive cardiomyopathy (HCC) 10/09/2014  . Labile blood pressure   . 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 Leukocytosis   . On amiodarone therapy 07/09/2015  . Pacemaker reprogramming/check 10/09/2014  . PAF (paroxysmal atrial fibrillation) (HCC)   . Paroxysmal atrial fibrillation (HCC)   . Prediabetes   . Respiratory failure with hypoxia (HCC)   . Right hemiparesis (HCC) 05/25/2015  . S/P ablation of atrial flutter 01/23/2015  . Seizure (HCC) 01/18/2018  . SSS (sick sinus syndrome) (HCC)   . Stroke (cerebrum) (HCC) 05/20/2015   Overview:  treated with LMCA stent 05/20/15  . Stroke (HCC)   . SVT (supraventricular tachycardia) (HCC) 01/15/2015  . Tachypnea   . Thrombocytopenia (HCC)   . Tobacco abuse   . Tobacco use disorder   . Trigeminal neuralgia 10/13/2012  . Typical atrial flutter (HCC) 01/15/2015    Past Surgical History:  Procedure Laterality Date  . [facility]  [facility] 2    . INSERT / REPLACE / REMOVE PACEMAKER  2008   MDT  . KNEE ARTHROSCOPY    . LAMINECTOMY    . RADIOLOGY WITH ANESTHESIA N/A 05/20/2015   Procedure: RADIOLOGY WITH ANESTHESIA;  Surgeon: Medication Radiologist, MD;  Location: [facility] OR;  Service: Radiology;  Laterality: N/A;  . rotator cuff surgery      Current Medications: Current Meds  Medication Sig  . apixaban (ELIQUIS) 5 MG TABS tablet Take 1 tablet (5 mg total) by mouth 2 (two) times daily.  . clopidogrel (PLAVIX) 75 MG tablet Take 1  tablet (75 mg total) by mouth daily with breakfast.  . donepezil (ARICEPT) 10 MG tablet Take 10 mg by mouth at bedtime.  . dronedarone (MULTAQ) 400 MG tablet Take 1 tablet (400 mg total) by mouth 2 (two) times daily with a meal.  . furosemide (LASIX) 20 MG tablet Take 1 tablet (20 mg total) by mouth 2 (two) times daily.  . memantine (NAMENDA) 5 MG tablet Take 5 mg by mouth daily.  . metoprolol succinate (TOPROL-XL) 25 MG 24 hr tablet Take 12.5 mg by mouth daily.  . rosuvastatin (CRESTOR) 40 MG tablet Take 40 mg by mouth daily.  . tamsulosin (FLOMAX) 0.4 MG CAPS capsule Take 0.4 mg by mouth daily.  . [DISCONTINUED] furosemide (LASIX) 20 MG tablet Take 20 mg by mouth daily.     Allergies:   Bee venom   Social History   Socioeconomic History  . Marital status: Widowed    Spouse name: Not on  file  . Number of children: Not on file  . Years of education: Not on file  . Highest education level: Not on file  Occupational History  . Not on file  Social Needs  . Financial resource strain: Not on file  . Food insecurity:    Worry: Not on file    Inability: Not on file  . Transportation needs:    Medical: Not on file    Non-medical: Not on file  Tobacco Use  . Smoking status: Former Smoker    Packs/day: 0.50    Types: Cigarettes    Start date: 2018  . Smokeless tobacco: Never Used  Substance and Sexual Activity  . Alcohol use: No  . Drug use: No  . Sexual activity: Not on file  Lifestyle  . Physical activity:    Days per week: Not on file    Minutes per session: Not on file  . Stress: Not on file  Relationships  . Social connections:    Talks on phone: Not on file    Gets together: Not on file    Attends religious service: Not on file    Active member of club or organization: Not on file    Attends meetings of clubs or organizations: Not on file    Relationship status: Not on file  Other Topics Concern  . Not on file  Social History Narrative  . Not on file     Family  History: The patient's family history includes CVA in his father; Cancer (age of onset: 65) in his sister; Colon cancer in his mother. ROS:   Please see the history of present illness.    All other systems reviewed and are negative.  EKGs/Labs/Other Studies Reviewed:    The following studies were reviewed today:  EKG:  EKG ordered today.  The ekg ordered today demonstrates atrial paced rhythm conducted QRS nonspecific ST abnormality  Recent Labs: No results found for requested labs within last 8760 hours.  Recent Lipid Panel    Component Value Date/Time   CHOL 84 05/21/2015 0522   TRIG 59 05/21/2015 0522   HDL 29 (L) 05/21/2015 0522   CHOLHDL 2.9 05/21/2015 0522   VLDL 12 05/21/2015 0522   LDLCALC 43 05/21/2015 0522    Physical Exam:    VS:  BP (!) 84/50 (BP Location: Right Arm, Patient Position: Sitting, Cuff Size: Large)   Pulse 66   Ht 5\' 9"  (1.753 m)   Wt 257 lb (116.6 kg)   SpO2 91%   BMI 37.95 kg/m     Wt Readings from Last 3 Encounters:  01/19/18 257 lb (116.6 kg)  06/15/17 227 lb (103 kg)  10/19/16 240 lb (108.9 kg)     GEN:  Well nourished, well developed in no acute distress HEENT: Normal NECK: No JVD; No carotid bruits LYMPHATICS: No lymphadenopathy CARDIAC: Distant heart sounds RRR, no murmurs, rubs, gallops RESPIRATORY: Expiratory wheezing ABDOMEN: Soft, non-tender, non-distended MUSCULOSKELETAL: 2+ bilateral to the knee and sacral edema; No deformity  SKIN: Warm and dry NEUROLOGIC:  Alert and oriented x 3 PSYCHIATRIC:  Normal affect    Signed, Norman Herrlich, MD  01/19/2018 1:55 PM    [facility]

## 2018-01-19 ENCOUNTER — Encounter: Payer: Self-pay | Admitting: Cardiology

## 2018-01-19 ENCOUNTER — Ambulatory Visit (INDEPENDENT_AMBULATORY_CARE_PROVIDER_SITE_OTHER): Payer: Medicare HMO | Admitting: Cardiology

## 2018-01-19 VITALS — BP 84/50 | HR 66 | Ht 69.0 in | Wt 257.0 lb

## 2018-01-19 DIAGNOSIS — E785 Hyperlipidemia, unspecified: Secondary | ICD-10-CM

## 2018-01-19 DIAGNOSIS — I6522 Occlusion and stenosis of left carotid artery: Secondary | ICD-10-CM | POA: Insufficient documentation

## 2018-01-19 DIAGNOSIS — I714 Abdominal aortic aneurysm, without rupture, unspecified: Secondary | ICD-10-CM | POA: Insufficient documentation

## 2018-01-19 DIAGNOSIS — I25119 Atherosclerotic heart disease of native coronary artery with unspecified angina pectoris: Secondary | ICD-10-CM | POA: Diagnosis not present

## 2018-01-19 DIAGNOSIS — I480 Paroxysmal atrial fibrillation: Secondary | ICD-10-CM

## 2018-01-19 DIAGNOSIS — I517 Cardiomegaly: Secondary | ICD-10-CM

## 2018-01-19 DIAGNOSIS — I110 Hypertensive heart disease with heart failure: Secondary | ICD-10-CM | POA: Diagnosis not present

## 2018-01-19 DIAGNOSIS — Z7901 Long term (current) use of anticoagulants: Secondary | ICD-10-CM

## 2018-01-19 DIAGNOSIS — Z950 Presence of cardiac pacemaker: Secondary | ICD-10-CM | POA: Diagnosis not present

## 2018-01-19 DIAGNOSIS — I5032 Chronic diastolic (congestive) heart failure: Secondary | ICD-10-CM

## 2018-01-19 MED ORDER — FUROSEMIDE 20 MG PO TABS: 20.0000 mg | ORAL_TABLET | Freq: Two times a day (BID) | ORAL | 6 refills | Status: AC

## 2018-01-19 NOTE — Patient Instructions (Signed)
Medication Instructions:  Your physician has recommended you make the following change in your medication:   INCREASE furosemide (lasix) 20 mg: Take 1 tablet twice daily   If you need a refill on your cardiac medications before your next appointment, please call your pharmacy.   Lab work: None  If you have labs (blood work) drawn today and your tests are completely normal, you will receive your results only by: Marland Kitchen. MyChart Message (if you have MyChart) OR . A paper copy in the mail If you have any lab test that is abnormal or we need to change your treatment, we will call you to review the results.  Testing/Procedures: You had an EKG today.   Follow-Up: At [facility], you and your health needs are our priority.  As part of our continuing mission to provide you with exceptional heart care, we have created designated Provider Care Teams.  These Care Teams include your primary Cardiologist (physician) and Advanced Practice Providers (APPs -  Physician Assistants and Nurse Practitioners) who all work together to provide you with the care you need, when you need it. You will need a follow up appointment in 6 months.  Please call our office 2 months in advance to schedule this appointment.

## 2018-02-07 ENCOUNTER — Other Ambulatory Visit: Payer: Self-pay | Admitting: Cardiology

## 2018-02-07 DIAGNOSIS — I480 Paroxysmal atrial fibrillation: Secondary | ICD-10-CM

## 2018-02-07 MED ORDER — DRONEDARONE HCL 400 MG PO TABS: 400.0000 mg | ORAL_TABLET | Freq: Two times a day (BID) | ORAL | 3 refills | Status: AC

## 2018-02-14 DIAGNOSIS — I959 Hypotension, unspecified: Secondary | ICD-10-CM | POA: Diagnosis not present

## 2018-02-14 DIAGNOSIS — R4701 Aphasia: Secondary | ICD-10-CM | POA: Diagnosis not present

## 2018-02-14 DIAGNOSIS — R41841 Cognitive communication deficit: Secondary | ICD-10-CM | POA: Diagnosis not present

## 2018-02-14 DIAGNOSIS — R279 Unspecified lack of coordination: Secondary | ICD-10-CM | POA: Diagnosis not present

## 2018-02-14 DIAGNOSIS — I130 Hypertensive heart and chronic kidney disease with heart failure and stage 1 through stage 4 chronic kidney disease, or unspecified chronic kidney disease: Secondary | ICD-10-CM | POA: Diagnosis not present

## 2018-02-14 DIAGNOSIS — R278 Other lack of coordination: Secondary | ICD-10-CM | POA: Diagnosis not present

## 2018-02-14 DIAGNOSIS — I509 Heart failure, unspecified: Secondary | ICD-10-CM | POA: Diagnosis not present

## 2018-02-14 DIAGNOSIS — Z8673 Personal history of transient ischemic attack (TIA), and cerebral infarction without residual deficits: Secondary | ICD-10-CM | POA: Diagnosis not present

## 2018-02-14 DIAGNOSIS — I10 Essential (primary) hypertension: Secondary | ICD-10-CM | POA: Diagnosis not present

## 2018-02-14 DIAGNOSIS — I4892 Unspecified atrial flutter: Secondary | ICD-10-CM | POA: Diagnosis not present

## 2018-02-14 DIAGNOSIS — R1312 Dysphagia, oropharyngeal phase: Secondary | ICD-10-CM | POA: Diagnosis not present

## 2018-02-14 DIAGNOSIS — N179 Acute kidney failure, unspecified: Secondary | ICD-10-CM | POA: Diagnosis not present

## 2018-02-14 DIAGNOSIS — R262 Difficulty in walking, not elsewhere classified: Secondary | ICD-10-CM | POA: Diagnosis not present

## 2018-02-14 DIAGNOSIS — K219 Gastro-esophageal reflux disease without esophagitis: Secondary | ICD-10-CM | POA: Diagnosis not present

## 2018-02-14 DIAGNOSIS — Z741 Need for assistance with personal care: Secondary | ICD-10-CM | POA: Diagnosis not present

## 2018-02-14 DIAGNOSIS — R1310 Dysphagia, unspecified: Secondary | ICD-10-CM | POA: Diagnosis not present

## 2018-02-14 DIAGNOSIS — E039 Hypothyroidism, unspecified: Secondary | ICD-10-CM | POA: Diagnosis not present

## 2018-02-14 DIAGNOSIS — R69 Illness, unspecified: Secondary | ICD-10-CM | POA: Diagnosis not present

## 2018-02-14 DIAGNOSIS — M6281 Muscle weakness (generalized): Secondary | ICD-10-CM | POA: Diagnosis not present

## 2018-02-14 DIAGNOSIS — I4891 Unspecified atrial fibrillation: Secondary | ICD-10-CM | POA: Diagnosis not present

## 2018-02-14 DIAGNOSIS — J9601 Acute respiratory failure with hypoxia: Secondary | ICD-10-CM | POA: Diagnosis not present

## 2018-02-14 DIAGNOSIS — R0602 Shortness of breath: Secondary | ICD-10-CM | POA: Diagnosis not present

## 2018-02-14 DIAGNOSIS — R05 Cough: Secondary | ICD-10-CM | POA: Diagnosis not present

## 2018-02-14 DIAGNOSIS — J690 Pneumonitis due to inhalation of food and vomit: Secondary | ICD-10-CM | POA: Diagnosis not present

## 2018-02-14 DIAGNOSIS — I110 Hypertensive heart disease with heart failure: Secondary | ICD-10-CM | POA: Diagnosis not present

## 2018-02-14 DIAGNOSIS — Z743 Need for continuous supervision: Secondary | ICD-10-CM | POA: Diagnosis not present

## 2018-02-14 DIAGNOSIS — F0390 Unspecified dementia without behavioral disturbance: Secondary | ICD-10-CM

## 2018-02-14 DIAGNOSIS — E669 Obesity, unspecified: Secondary | ICD-10-CM | POA: Diagnosis not present

## 2018-02-14 DIAGNOSIS — R918 Other nonspecific abnormal finding of lung field: Secondary | ICD-10-CM | POA: Diagnosis not present

## 2018-02-14 DIAGNOSIS — I5031 Acute diastolic (congestive) heart failure: Secondary | ICD-10-CM | POA: Diagnosis not present

## 2018-02-14 DIAGNOSIS — R269 Unspecified abnormalities of gait and mobility: Secondary | ICD-10-CM | POA: Diagnosis not present

## 2018-02-15 DIAGNOSIS — I4892 Unspecified atrial flutter: Secondary | ICD-10-CM | POA: Diagnosis not present

## 2018-02-15 DIAGNOSIS — I4891 Unspecified atrial fibrillation: Secondary | ICD-10-CM | POA: Diagnosis not present

## 2018-02-15 DIAGNOSIS — E039 Hypothyroidism, unspecified: Secondary | ICD-10-CM | POA: Diagnosis not present

## 2018-02-15 DIAGNOSIS — R69 Illness, unspecified: Secondary | ICD-10-CM | POA: Diagnosis not present

## 2018-02-15 DIAGNOSIS — E669 Obesity, unspecified: Secondary | ICD-10-CM | POA: Diagnosis not present

## 2018-02-15 DIAGNOSIS — I5031 Acute diastolic (congestive) heart failure: Secondary | ICD-10-CM | POA: Diagnosis not present

## 2018-02-16 DIAGNOSIS — I4892 Unspecified atrial flutter: Secondary | ICD-10-CM | POA: Diagnosis not present

## 2018-02-16 DIAGNOSIS — R69 Illness, unspecified: Secondary | ICD-10-CM | POA: Diagnosis not present

## 2018-02-16 DIAGNOSIS — E669 Obesity, unspecified: Secondary | ICD-10-CM | POA: Diagnosis not present

## 2018-02-16 DIAGNOSIS — I4891 Unspecified atrial fibrillation: Secondary | ICD-10-CM | POA: Diagnosis not present

## 2018-02-16 DIAGNOSIS — E039 Hypothyroidism, unspecified: Secondary | ICD-10-CM | POA: Diagnosis not present

## 2018-02-16 DIAGNOSIS — I5031 Acute diastolic (congestive) heart failure: Secondary | ICD-10-CM | POA: Diagnosis not present

## 2018-02-17 DIAGNOSIS — I5031 Acute diastolic (congestive) heart failure: Secondary | ICD-10-CM | POA: Diagnosis not present

## 2018-02-17 DIAGNOSIS — E039 Hypothyroidism, unspecified: Secondary | ICD-10-CM | POA: Diagnosis not present

## 2018-02-17 DIAGNOSIS — E669 Obesity, unspecified: Secondary | ICD-10-CM | POA: Diagnosis not present

## 2018-02-17 DIAGNOSIS — R69 Illness, unspecified: Secondary | ICD-10-CM | POA: Diagnosis not present

## 2018-02-17 DIAGNOSIS — I4891 Unspecified atrial fibrillation: Secondary | ICD-10-CM | POA: Diagnosis not present

## 2018-02-17 DIAGNOSIS — I4892 Unspecified atrial flutter: Secondary | ICD-10-CM | POA: Diagnosis not present

## 2018-02-18 DIAGNOSIS — E669 Obesity, unspecified: Secondary | ICD-10-CM | POA: Diagnosis not present

## 2018-02-18 DIAGNOSIS — R69 Illness, unspecified: Secondary | ICD-10-CM | POA: Diagnosis not present

## 2018-02-18 DIAGNOSIS — E039 Hypothyroidism, unspecified: Secondary | ICD-10-CM | POA: Diagnosis not present

## 2018-02-18 DIAGNOSIS — I5031 Acute diastolic (congestive) heart failure: Secondary | ICD-10-CM | POA: Diagnosis not present

## 2018-02-18 DIAGNOSIS — I4891 Unspecified atrial fibrillation: Secondary | ICD-10-CM | POA: Diagnosis not present

## 2018-02-18 DIAGNOSIS — I4892 Unspecified atrial flutter: Secondary | ICD-10-CM | POA: Diagnosis not present

## 2018-02-19 DIAGNOSIS — I4892 Unspecified atrial flutter: Secondary | ICD-10-CM | POA: Diagnosis not present

## 2018-02-19 DIAGNOSIS — E039 Hypothyroidism, unspecified: Secondary | ICD-10-CM | POA: Diagnosis not present

## 2018-02-19 DIAGNOSIS — I4891 Unspecified atrial fibrillation: Secondary | ICD-10-CM | POA: Diagnosis not present

## 2018-02-19 DIAGNOSIS — I5031 Acute diastolic (congestive) heart failure: Secondary | ICD-10-CM | POA: Diagnosis not present

## 2018-02-19 DIAGNOSIS — R69 Illness, unspecified: Secondary | ICD-10-CM | POA: Diagnosis not present

## 2018-02-19 DIAGNOSIS — E669 Obesity, unspecified: Secondary | ICD-10-CM | POA: Diagnosis not present

## 2018-02-20 DIAGNOSIS — R69 Illness, unspecified: Secondary | ICD-10-CM | POA: Diagnosis not present

## 2018-02-20 DIAGNOSIS — I5031 Acute diastolic (congestive) heart failure: Secondary | ICD-10-CM | POA: Diagnosis not present

## 2018-02-20 DIAGNOSIS — E039 Hypothyroidism, unspecified: Secondary | ICD-10-CM | POA: Diagnosis not present

## 2018-02-20 DIAGNOSIS — E669 Obesity, unspecified: Secondary | ICD-10-CM | POA: Diagnosis not present

## 2018-02-20 DIAGNOSIS — I4891 Unspecified atrial fibrillation: Secondary | ICD-10-CM | POA: Diagnosis not present

## 2018-02-20 DIAGNOSIS — I4892 Unspecified atrial flutter: Secondary | ICD-10-CM | POA: Diagnosis not present

## 2018-02-21 DIAGNOSIS — E039 Hypothyroidism, unspecified: Secondary | ICD-10-CM | POA: Diagnosis not present

## 2018-02-21 DIAGNOSIS — R69 Illness, unspecified: Secondary | ICD-10-CM | POA: Diagnosis not present

## 2018-02-21 DIAGNOSIS — E669 Obesity, unspecified: Secondary | ICD-10-CM | POA: Diagnosis not present

## 2018-02-21 DIAGNOSIS — I4891 Unspecified atrial fibrillation: Secondary | ICD-10-CM | POA: Diagnosis not present

## 2018-02-21 DIAGNOSIS — I4892 Unspecified atrial flutter: Secondary | ICD-10-CM | POA: Diagnosis not present

## 2018-02-21 DIAGNOSIS — I5031 Acute diastolic (congestive) heart failure: Secondary | ICD-10-CM | POA: Diagnosis not present

## 2018-02-22 DIAGNOSIS — R1312 Dysphagia, oropharyngeal phase: Secondary | ICD-10-CM | POA: Diagnosis not present

## 2018-02-22 DIAGNOSIS — J811 Chronic pulmonary edema: Secondary | ICD-10-CM | POA: Diagnosis not present

## 2018-02-22 DIAGNOSIS — I5031 Acute diastolic (congestive) heart failure: Secondary | ICD-10-CM | POA: Diagnosis not present

## 2018-02-22 DIAGNOSIS — R69 Illness, unspecified: Secondary | ICD-10-CM | POA: Diagnosis not present

## 2018-02-22 DIAGNOSIS — N179 Acute kidney failure, unspecified: Secondary | ICD-10-CM | POA: Diagnosis not present

## 2018-02-22 DIAGNOSIS — I4892 Unspecified atrial flutter: Secondary | ICD-10-CM | POA: Diagnosis not present

## 2018-02-22 DIAGNOSIS — I5030 Unspecified diastolic (congestive) heart failure: Secondary | ICD-10-CM | POA: Diagnosis not present

## 2018-02-22 DIAGNOSIS — E669 Obesity, unspecified: Secondary | ICD-10-CM | POA: Diagnosis not present

## 2018-02-22 DIAGNOSIS — E039 Hypothyroidism, unspecified: Secondary | ICD-10-CM | POA: Diagnosis not present

## 2018-02-22 DIAGNOSIS — R41841 Cognitive communication deficit: Secondary | ICD-10-CM | POA: Diagnosis not present

## 2018-02-22 DIAGNOSIS — Z8673 Personal history of transient ischemic attack (TIA), and cerebral infarction without residual deficits: Secondary | ICD-10-CM | POA: Diagnosis not present

## 2018-02-22 DIAGNOSIS — Z79899 Other long term (current) drug therapy: Secondary | ICD-10-CM | POA: Diagnosis not present

## 2018-02-22 DIAGNOSIS — F0390 Unspecified dementia without behavioral disturbance: Secondary | ICD-10-CM | POA: Diagnosis not present

## 2018-02-22 DIAGNOSIS — D649 Anemia, unspecified: Secondary | ICD-10-CM | POA: Diagnosis not present

## 2018-02-22 DIAGNOSIS — Z743 Need for continuous supervision: Secondary | ICD-10-CM | POA: Diagnosis not present

## 2018-02-22 DIAGNOSIS — R269 Unspecified abnormalities of gait and mobility: Secondary | ICD-10-CM | POA: Diagnosis not present

## 2018-02-22 DIAGNOSIS — R278 Other lack of coordination: Secondary | ICD-10-CM | POA: Diagnosis not present

## 2018-02-22 DIAGNOSIS — R05 Cough: Secondary | ICD-10-CM | POA: Diagnosis not present

## 2018-02-22 DIAGNOSIS — J9690 Respiratory failure, unspecified, unspecified whether with hypoxia or hypercapnia: Secondary | ICD-10-CM | POA: Diagnosis not present

## 2018-02-22 DIAGNOSIS — R1310 Dysphagia, unspecified: Secondary | ICD-10-CM | POA: Diagnosis not present

## 2018-02-22 DIAGNOSIS — I480 Paroxysmal atrial fibrillation: Secondary | ICD-10-CM | POA: Diagnosis not present

## 2018-02-22 DIAGNOSIS — M6281 Muscle weakness (generalized): Secondary | ICD-10-CM | POA: Diagnosis not present

## 2018-02-22 DIAGNOSIS — I959 Hypotension, unspecified: Secondary | ICD-10-CM | POA: Diagnosis not present

## 2018-02-22 DIAGNOSIS — R279 Unspecified lack of coordination: Secondary | ICD-10-CM | POA: Diagnosis not present

## 2018-02-22 DIAGNOSIS — R262 Difficulty in walking, not elsewhere classified: Secondary | ICD-10-CM | POA: Diagnosis not present

## 2018-02-22 DIAGNOSIS — R4701 Aphasia: Secondary | ICD-10-CM | POA: Diagnosis not present

## 2018-02-22 DIAGNOSIS — I10 Essential (primary) hypertension: Secondary | ICD-10-CM | POA: Diagnosis not present

## 2018-02-22 DIAGNOSIS — I517 Cardiomegaly: Secondary | ICD-10-CM | POA: Diagnosis not present

## 2018-02-22 DIAGNOSIS — N390 Urinary tract infection, site not specified: Secondary | ICD-10-CM | POA: Diagnosis not present

## 2018-02-22 DIAGNOSIS — R0602 Shortness of breath: Secondary | ICD-10-CM | POA: Diagnosis not present

## 2018-02-22 DIAGNOSIS — I4891 Unspecified atrial fibrillation: Secondary | ICD-10-CM | POA: Diagnosis not present

## 2018-02-22 DIAGNOSIS — Z741 Need for assistance with personal care: Secondary | ICD-10-CM | POA: Diagnosis not present

## 2018-02-22 DIAGNOSIS — J690 Pneumonitis due to inhalation of food and vomit: Secondary | ICD-10-CM | POA: Diagnosis not present

## 2018-02-24 DIAGNOSIS — J690 Pneumonitis due to inhalation of food and vomit: Secondary | ICD-10-CM | POA: Diagnosis not present

## 2018-02-24 DIAGNOSIS — J9690 Respiratory failure, unspecified, unspecified whether with hypoxia or hypercapnia: Secondary | ICD-10-CM | POA: Diagnosis not present

## 2018-02-24 DIAGNOSIS — R69 Illness, unspecified: Secondary | ICD-10-CM | POA: Diagnosis not present

## 2018-02-24 DIAGNOSIS — I5030 Unspecified diastolic (congestive) heart failure: Secondary | ICD-10-CM | POA: Diagnosis not present

## 2018-02-28 DIAGNOSIS — R1310 Dysphagia, unspecified: Secondary | ICD-10-CM | POA: Diagnosis not present

## 2018-02-28 DIAGNOSIS — R69 Illness, unspecified: Secondary | ICD-10-CM | POA: Diagnosis not present

## 2018-02-28 DIAGNOSIS — I480 Paroxysmal atrial fibrillation: Secondary | ICD-10-CM | POA: Diagnosis not present

## 2018-02-28 DIAGNOSIS — E039 Hypothyroidism, unspecified: Secondary | ICD-10-CM | POA: Diagnosis not present

## 2018-03-08 DIAGNOSIS — I5030 Unspecified diastolic (congestive) heart failure: Secondary | ICD-10-CM | POA: Diagnosis not present

## 2018-03-08 DIAGNOSIS — J690 Pneumonitis due to inhalation of food and vomit: Secondary | ICD-10-CM | POA: Diagnosis not present

## 2018-03-08 DIAGNOSIS — J9690 Respiratory failure, unspecified, unspecified whether with hypoxia or hypercapnia: Secondary | ICD-10-CM | POA: Diagnosis not present

## 2018-03-08 DIAGNOSIS — R69 Illness, unspecified: Secondary | ICD-10-CM | POA: Diagnosis not present

## 2018-03-21 DIAGNOSIS — R69 Illness, unspecified: Secondary | ICD-10-CM | POA: Diagnosis not present

## 2018-03-21 DIAGNOSIS — R1310 Dysphagia, unspecified: Secondary | ICD-10-CM | POA: Diagnosis not present

## 2018-03-21 DIAGNOSIS — I5030 Unspecified diastolic (congestive) heart failure: Secondary | ICD-10-CM | POA: Diagnosis not present

## 2018-03-21 DIAGNOSIS — J9690 Respiratory failure, unspecified, unspecified whether with hypoxia or hypercapnia: Secondary | ICD-10-CM | POA: Diagnosis not present

## 2018-03-24 ENCOUNTER — Encounter: Payer: Self-pay | Admitting: *Deleted

## 2018-03-24 DIAGNOSIS — J9690 Respiratory failure, unspecified, unspecified whether with hypoxia or hypercapnia: Secondary | ICD-10-CM | POA: Diagnosis not present

## 2018-03-24 DIAGNOSIS — I5030 Unspecified diastolic (congestive) heart failure: Secondary | ICD-10-CM | POA: Diagnosis not present

## 2018-03-24 DIAGNOSIS — R69 Illness, unspecified: Secondary | ICD-10-CM | POA: Diagnosis not present

## 2018-03-24 DIAGNOSIS — R1310 Dysphagia, unspecified: Secondary | ICD-10-CM | POA: Diagnosis not present

## 2018-04-04 ENCOUNTER — Encounter: Payer: Medicare HMO | Admitting: Cardiology

## 2018-04-04 NOTE — Progress Notes (Deleted)
Electrophysiology Office Note   Date:  04/04/2018   ID:  Luis Robinson, DOB 09-Sep-1940, MRN [id number]  PCP:  Alinda Deem, MD  Cardiologist:  Dulce Sellar Primary Electrophysiologist:  Jibril Mcminn Jorja Loa, MD    No chief complaint on file.    History of Present Illness: Luis Robinson is a 78 y.o. male who is being seen today for the evaluation of atrial fibrillation at the request of Alinda Deem, MD. Presenting today for electrophysiology evaluation. He has history of coronary disease status post an STEMI with PCI on 10/13/2014, paroxysmal atrial fibrillation, ablation 01/13/2015 of a left-sided atrial flutter, hypertension, CVA, on amiodarone and with a pacemaker in place. He has had multiple cardioversions while on amiodarone. He has had 2 cardioversions within the last 6 months. When he goes in atrial fibrillation, he has symptoms of fatigue and shortness of breath. He has had to be hospitalized 2 times prior due to his symptoms. He otherwise has no further symptoms when he is in normal rhythm. He is feeling well today without major complaint.   Today, denies symptoms of palpitations, chest pain, shortness of breath, orthopnea, PND, lower extremity edema, claudication, dizziness, presyncope, syncope, bleeding, or neurologic sequela. The patient is tolerating medications without difficulties. ***    Past Medical History:  Diagnosis Date  . Acute blood loss anemia   . Aphasia due to stroke 05/25/2015  . Atrial fibrillation with RVR (HCC) 05/22/2015  . Bowel incontinence   . CAD S/P percutaneous coronary angioplasty Aug 2016   Dx1 DES, no other significant dis  . Cardiac pacemaker in situ-MDT- implanted 2008 05/22/2015  . Chronic anticoagulation    Eliquis  . Coronary artery disease involving native coronary artery of native heart with angina pectoris (HCC) 10/30/2014   Overview:  PCI and stent diagonal , Aug 2016  . CVA (cerebral infarction)   . CVA due to occlusion of cerebral  artery- treated with LMCA stent 05/20/15   . Dizziness   . Dyslipidemia 05/22/2015  . Dyspnea   . Essential hypertension 07/09/2015  . Familial hypercholesterolemia 01/18/2018  . History of back surgery   . History of ETT   . Hyperlipidemia   . Hyperlipidemia, unspecified 10/09/2014  . Hypertension   . Hypertensive heart disease 01/18/2018  . Hypertrophic cardiomyopathy (HCC)   . Hypertrophic nonobstructive cardiomyopathy (HCC) 10/09/2014  . Labile blood pressure   . 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 Leukocytosis   . On amiodarone therapy 07/09/2015  . Pacemaker reprogramming/check 10/09/2014  . PAF (paroxysmal atrial fibrillation) (HCC)   . Paroxysmal atrial fibrillation (HCC)   . Prediabetes   . Respiratory failure with hypoxia (HCC)   . Right hemiparesis (HCC) 05/25/2015  . S/P ablation of atrial flutter 01/23/2015  . Seizure (HCC) 01/18/2018  . SSS (sick sinus syndrome) (HCC)   . Stroke (cerebrum) (HCC) 05/20/2015   Overview:  treated with LMCA stent 05/20/15  . Stroke (HCC)   . SVT (supraventricular tachycardia) (HCC) 01/15/2015  . Tachypnea   . Thrombocytopenia (HCC)   . Tobacco abuse   . Tobacco use disorder   . Trigeminal neuralgia 10/13/2012  . Typical atrial flutter (HCC) 01/15/2015   Past Surgical History:  Procedure Laterality Date  . ABLATION  Nov 2016   [facility]  . back surgery x 2    . INSERT / REPLACE / REMOVE PACEMAKER  2008   MDT  . KNEE ARTHROSCOPY    . LAMINECTOMY    . RADIOLOGY WITH ANESTHESIA N/A  05/20/2015   Procedure: RADIOLOGY WITH ANESTHESIA;  Surgeon: Medication Radiologist, MD;  Location: [facility] OR;  Service: Radiology;  Laterality: N/A;  . rotator cuff surgery       Current Outpatient Medications  Medication Sig Dispense Refill  . apixaban (ELIQUIS) 5 MG TABS tablet Take 1 tablet (5 mg total) by mouth 2 (two) times daily. 60 tablet 1  . clopidogrel (PLAVIX) 75 MG tablet Take 1 tablet (75 mg total) by mouth daily with breakfast. 30  tablet 1  . donepezil (ARICEPT) 10 MG tablet Take 10 mg by mouth at bedtime.    . dronedarone (MULTAQ) 400 MG tablet Take 1 tablet (400 mg total) by mouth 2 (two) times daily with a meal. 60 tablet 3  . furosemide (LASIX) 20 MG tablet Take 1 tablet (20 mg total) by mouth 2 (two) times daily. 60 tablet 6  . memantine (NAMENDA) 5 MG tablet Take 5 mg by mouth daily.    . metoprolol succinate (TOPROL-XL) 25 MG 24 hr tablet Take 12.5 mg by mouth daily.    . rosuvastatin (CRESTOR) 40 MG tablet Take 40 mg by mouth daily.    . tamsulosin (FLOMAX) 0.4 MG CAPS capsule Take 0.4 mg by mouth daily.  2   No current facility-administered medications for this visit.     Allergies:   Bee venom   Social History:  The patient  reports that he has quit smoking. His smoking use included cigarettes. He started smoking about 2 years ago. He smoked 0.50 packs per day. He has never used smokeless tobacco. He reports that he does not drink alcohol or use drugs.   Family History:  The patient's family history includes CVA in his father; Cancer (age of onset: 35) in his sister; Colon cancer in his mother; Heart attack in his maternal grandfather and maternal grandmother; Heart disease in his father and sister; Hypertension in his paternal aunt; Stroke in his maternal grandmother.    ROS:  Please see the history of present illness.   Otherwise, review of systems is positive for ***.   All other systems are reviewed and negative.   PHYSICAL EXAM: VS:  There were no vitals taken for this visit. , BMI There is no height or weight on file to calculate BMI. GEN: Well nourished, well developed, in no acute distress  HEENT: normal  Neck: no JVD, carotid bruits, or masses Cardiac: ***RRR; no murmurs, rubs, or gallops,no edema  Respiratory:  clear to auscultation bilaterally, normal work of breathing GI: soft, nontender, nondistended, + BS MS: no deformity or atrophy  Skin: warm and dry, ***device site well healed Neuro:   Strength and sensation are intact Psych: euthymic mood, full affect  EKG:  EKG {ACTION; IS/IS [id number]} ordered today. Personal review of the ekg ordered *** shows ***  ***Personal review of the device interrogation today. Results in [facility]    Recent Labs: No results found for requested labs within last 8760 hours.    Lipid Panel     Component Value Date/Time   CHOL 84 05/21/2015 0522   TRIG 59 05/21/2015 0522   HDL 29 (L) 05/21/2015 0522   CHOLHDL 2.9 05/21/2015 0522   VLDL 12 05/21/2015 0522   LDLCALC 43 05/21/2015 0522     Wt Readings from Last 3 Encounters:  01/19/18 257 lb (116.6 kg)  06/15/17 227 lb (103 kg)  10/19/16 240 lb (108.9 kg)      Other studies Reviewed: Additional studies/ records that were reviewed today include:  TTE 05/23/15  Review of the above records today demonstrates:  - Left ventricle: The cavity size was normal. There was moderate   concentric hypertrophy. Systolic function was normal. The   estimated ejection fraction was in the range of 60% to 65%. Wall   motion was normal; there were no regional wall motion   abnormalities. Diastolic dysfunction with elevated LV filling   pressure. - Aortic valve: Mildly calcified leaflets. There was no stenosis.   There was trivial regurgitation. - Mitral valve: Heavily calcified annulus. Mildly thickened   leaflets . Mild stenosis. There was moderate regurgitation. - Left atrium: Severely dilated at 62 ml/m2. - Tricuspid valve: There was moderate regurgitation. - Pulmonary arteries: PA peak pressure: 54 mm Hg (S). - Systemic veins: The IVC measures >2.1 cm, but collapses more than   50%, suggesting an RA pressure of 8 mmHG.  02/07/16 Myoview  Nuclear stress EF: 55%.  The study is normal.  This is a low risk study.  The left ventricular ejection fraction is normal (55-65%).  There was no ST segment deviation noted during stress.  ASSESSMENT AND PLAN:  1.  Paroxysmal atrial  fibrillation: On Eliquis and Multitak.  His LFTs and TSH were elevated with amiodarone.***  This patients CHA2DS2-VASc Score and unadjusted Ischemic Stroke Rate (% per year) is equal to 9.7 % stroke rate/year from a score of 6  Above score calculated as 1 point each if present [CHF, HTN, DM, Vascular=MI/PAD/Aortic Plaque, Age if 65-74, or Male] Above score calculated as 2 points each if present [Age > 75, or Stroke/TIA/TE]   2. Hypertension: ***  3. Coronary artery disease status post an STEMI and drug-eluting stent placement: ***  4. Sick sinus syndrome: ***Atrial dependent.  No changes at this time.    Current medicines are reviewed at length with the patient today.   The patient does not have concerns regarding his medicines.  The following changes were made today: ***  Labs/ tests ordered today include:  No orders of the defined types were placed in this encounter.    Disposition:   FU with Vida Nicol *** months  Signed, Jabez Molner Jorja LoaMartin Shaneeka Scarboro, MD  04/04/2018 11:23 AM     [facility] [address] [place] [address] [phone] (office) [phone] (fax)

## 2018-04-18 DIAGNOSIS — I10 Essential (primary) hypertension: Secondary | ICD-10-CM | POA: Diagnosis not present

## 2018-04-18 DIAGNOSIS — D649 Anemia, unspecified: Secondary | ICD-10-CM | POA: Diagnosis not present

## 2018-04-18 DIAGNOSIS — N400 Enlarged prostate without lower urinary tract symptoms: Secondary | ICD-10-CM | POA: Diagnosis not present

## 2018-04-18 DIAGNOSIS — E039 Hypothyroidism, unspecified: Secondary | ICD-10-CM | POA: Diagnosis not present

## 2018-04-18 DIAGNOSIS — I480 Paroxysmal atrial fibrillation: Secondary | ICD-10-CM | POA: Diagnosis not present

## 2018-04-28 DIAGNOSIS — I480 Paroxysmal atrial fibrillation: Secondary | ICD-10-CM | POA: Diagnosis not present

## 2018-04-28 DIAGNOSIS — N400 Enlarged prostate without lower urinary tract symptoms: Secondary | ICD-10-CM | POA: Diagnosis not present

## 2018-04-28 DIAGNOSIS — I10 Essential (primary) hypertension: Secondary | ICD-10-CM | POA: Diagnosis not present

## 2018-04-28 DIAGNOSIS — E039 Hypothyroidism, unspecified: Secondary | ICD-10-CM | POA: Diagnosis not present

## 2018-05-01 DIAGNOSIS — E039 Hypothyroidism, unspecified: Secondary | ICD-10-CM | POA: Diagnosis not present

## 2018-05-01 DIAGNOSIS — I480 Paroxysmal atrial fibrillation: Secondary | ICD-10-CM | POA: Diagnosis not present

## 2018-05-01 DIAGNOSIS — I10 Essential (primary) hypertension: Secondary | ICD-10-CM | POA: Diagnosis not present

## 2018-05-01 DIAGNOSIS — N400 Enlarged prostate without lower urinary tract symptoms: Secondary | ICD-10-CM | POA: Diagnosis not present

## 2018-05-19 DIAGNOSIS — M24873 Other specific joint derangements of unspecified ankle, not elsewhere classified: Secondary | ICD-10-CM | POA: Diagnosis not present

## 2018-05-19 DIAGNOSIS — R262 Difficulty in walking, not elsewhere classified: Secondary | ICD-10-CM | POA: Diagnosis not present

## 2018-05-19 DIAGNOSIS — M21371 Foot drop, right foot: Secondary | ICD-10-CM | POA: Diagnosis not present

## 2018-05-24 DIAGNOSIS — I5031 Acute diastolic (congestive) heart failure: Secondary | ICD-10-CM | POA: Diagnosis not present

## 2018-05-25 DIAGNOSIS — R269 Unspecified abnormalities of gait and mobility: Secondary | ICD-10-CM | POA: Diagnosis not present

## 2018-05-25 DIAGNOSIS — R1312 Dysphagia, oropharyngeal phase: Secondary | ICD-10-CM | POA: Diagnosis not present

## 2018-05-25 DIAGNOSIS — R279 Unspecified lack of coordination: Secondary | ICD-10-CM | POA: Diagnosis not present

## 2018-05-25 DIAGNOSIS — R41841 Cognitive communication deficit: Secondary | ICD-10-CM | POA: Diagnosis not present

## 2018-05-25 DIAGNOSIS — M6281 Muscle weakness (generalized): Secondary | ICD-10-CM | POA: Diagnosis not present

## 2018-05-25 DIAGNOSIS — I5031 Acute diastolic (congestive) heart failure: Secondary | ICD-10-CM | POA: Diagnosis not present

## 2018-05-25 DIAGNOSIS — R4701 Aphasia: Secondary | ICD-10-CM | POA: Diagnosis not present

## 2018-05-25 DIAGNOSIS — R278 Other lack of coordination: Secondary | ICD-10-CM | POA: Diagnosis not present

## 2018-05-25 DIAGNOSIS — Z741 Need for assistance with personal care: Secondary | ICD-10-CM | POA: Diagnosis not present

## 2018-05-25 DIAGNOSIS — R262 Difficulty in walking, not elsewhere classified: Secondary | ICD-10-CM | POA: Diagnosis not present

## 2018-05-26 DIAGNOSIS — R278 Other lack of coordination: Secondary | ICD-10-CM | POA: Diagnosis not present

## 2018-05-26 DIAGNOSIS — R262 Difficulty in walking, not elsewhere classified: Secondary | ICD-10-CM | POA: Diagnosis not present

## 2018-05-26 DIAGNOSIS — R4701 Aphasia: Secondary | ICD-10-CM | POA: Diagnosis not present

## 2018-05-26 DIAGNOSIS — R279 Unspecified lack of coordination: Secondary | ICD-10-CM | POA: Diagnosis not present

## 2018-05-26 DIAGNOSIS — R269 Unspecified abnormalities of gait and mobility: Secondary | ICD-10-CM | POA: Diagnosis not present

## 2018-05-26 DIAGNOSIS — I5031 Acute diastolic (congestive) heart failure: Secondary | ICD-10-CM | POA: Diagnosis not present

## 2018-05-26 DIAGNOSIS — R1312 Dysphagia, oropharyngeal phase: Secondary | ICD-10-CM | POA: Diagnosis not present

## 2018-05-26 DIAGNOSIS — M6281 Muscle weakness (generalized): Secondary | ICD-10-CM | POA: Diagnosis not present

## 2018-05-26 DIAGNOSIS — Z741 Need for assistance with personal care: Secondary | ICD-10-CM | POA: Diagnosis not present

## 2018-05-26 DIAGNOSIS — R41841 Cognitive communication deficit: Secondary | ICD-10-CM | POA: Diagnosis not present

## 2018-05-27 DIAGNOSIS — R269 Unspecified abnormalities of gait and mobility: Secondary | ICD-10-CM | POA: Diagnosis not present

## 2018-05-27 DIAGNOSIS — Z741 Need for assistance with personal care: Secondary | ICD-10-CM | POA: Diagnosis not present

## 2018-05-27 DIAGNOSIS — R4701 Aphasia: Secondary | ICD-10-CM | POA: Diagnosis not present

## 2018-05-27 DIAGNOSIS — M6281 Muscle weakness (generalized): Secondary | ICD-10-CM | POA: Diagnosis not present

## 2018-05-27 DIAGNOSIS — I5031 Acute diastolic (congestive) heart failure: Secondary | ICD-10-CM | POA: Diagnosis not present

## 2018-05-27 DIAGNOSIS — R279 Unspecified lack of coordination: Secondary | ICD-10-CM | POA: Diagnosis not present

## 2018-05-27 DIAGNOSIS — R1312 Dysphagia, oropharyngeal phase: Secondary | ICD-10-CM | POA: Diagnosis not present

## 2018-05-27 DIAGNOSIS — R278 Other lack of coordination: Secondary | ICD-10-CM | POA: Diagnosis not present

## 2018-05-27 DIAGNOSIS — R262 Difficulty in walking, not elsewhere classified: Secondary | ICD-10-CM | POA: Diagnosis not present

## 2018-05-27 DIAGNOSIS — R41841 Cognitive communication deficit: Secondary | ICD-10-CM | POA: Diagnosis not present

## 2018-05-29 DIAGNOSIS — I5031 Acute diastolic (congestive) heart failure: Secondary | ICD-10-CM | POA: Diagnosis not present

## 2018-05-29 DIAGNOSIS — M6281 Muscle weakness (generalized): Secondary | ICD-10-CM | POA: Diagnosis not present

## 2018-05-29 DIAGNOSIS — R269 Unspecified abnormalities of gait and mobility: Secondary | ICD-10-CM | POA: Diagnosis not present

## 2018-05-29 DIAGNOSIS — R279 Unspecified lack of coordination: Secondary | ICD-10-CM | POA: Diagnosis not present

## 2018-05-29 DIAGNOSIS — Z741 Need for assistance with personal care: Secondary | ICD-10-CM | POA: Diagnosis not present

## 2018-05-29 DIAGNOSIS — R1312 Dysphagia, oropharyngeal phase: Secondary | ICD-10-CM | POA: Diagnosis not present

## 2018-05-29 DIAGNOSIS — R262 Difficulty in walking, not elsewhere classified: Secondary | ICD-10-CM | POA: Diagnosis not present

## 2018-05-29 DIAGNOSIS — R278 Other lack of coordination: Secondary | ICD-10-CM | POA: Diagnosis not present

## 2018-05-29 DIAGNOSIS — R41841 Cognitive communication deficit: Secondary | ICD-10-CM | POA: Diagnosis not present

## 2018-05-29 DIAGNOSIS — R4701 Aphasia: Secondary | ICD-10-CM | POA: Diagnosis not present

## 2018-05-31 DIAGNOSIS — N400 Enlarged prostate without lower urinary tract symptoms: Secondary | ICD-10-CM | POA: Diagnosis not present

## 2018-05-31 DIAGNOSIS — R262 Difficulty in walking, not elsewhere classified: Secondary | ICD-10-CM | POA: Diagnosis not present

## 2018-05-31 DIAGNOSIS — R278 Other lack of coordination: Secondary | ICD-10-CM | POA: Diagnosis not present

## 2018-05-31 DIAGNOSIS — R4701 Aphasia: Secondary | ICD-10-CM | POA: Diagnosis not present

## 2018-05-31 DIAGNOSIS — R279 Unspecified lack of coordination: Secondary | ICD-10-CM | POA: Diagnosis not present

## 2018-05-31 DIAGNOSIS — M6281 Muscle weakness (generalized): Secondary | ICD-10-CM | POA: Diagnosis not present

## 2018-05-31 DIAGNOSIS — R41841 Cognitive communication deficit: Secondary | ICD-10-CM | POA: Diagnosis not present

## 2018-05-31 DIAGNOSIS — E039 Hypothyroidism, unspecified: Secondary | ICD-10-CM | POA: Diagnosis not present

## 2018-05-31 DIAGNOSIS — R1312 Dysphagia, oropharyngeal phase: Secondary | ICD-10-CM | POA: Diagnosis not present

## 2018-05-31 DIAGNOSIS — Z741 Need for assistance with personal care: Secondary | ICD-10-CM | POA: Diagnosis not present

## 2018-05-31 DIAGNOSIS — I10 Essential (primary) hypertension: Secondary | ICD-10-CM | POA: Diagnosis not present

## 2018-05-31 DIAGNOSIS — R269 Unspecified abnormalities of gait and mobility: Secondary | ICD-10-CM | POA: Diagnosis not present

## 2018-05-31 DIAGNOSIS — I5031 Acute diastolic (congestive) heart failure: Secondary | ICD-10-CM | POA: Diagnosis not present

## 2018-05-31 DIAGNOSIS — I480 Paroxysmal atrial fibrillation: Secondary | ICD-10-CM | POA: Diagnosis not present

## 2018-06-01 DIAGNOSIS — R278 Other lack of coordination: Secondary | ICD-10-CM | POA: Diagnosis not present

## 2018-06-01 DIAGNOSIS — R269 Unspecified abnormalities of gait and mobility: Secondary | ICD-10-CM | POA: Diagnosis not present

## 2018-06-01 DIAGNOSIS — M6281 Muscle weakness (generalized): Secondary | ICD-10-CM | POA: Diagnosis not present

## 2018-06-01 DIAGNOSIS — I5031 Acute diastolic (congestive) heart failure: Secondary | ICD-10-CM | POA: Diagnosis not present

## 2018-06-01 DIAGNOSIS — R4701 Aphasia: Secondary | ICD-10-CM | POA: Diagnosis not present

## 2018-06-01 DIAGNOSIS — R41841 Cognitive communication deficit: Secondary | ICD-10-CM | POA: Diagnosis not present

## 2018-06-01 DIAGNOSIS — Z741 Need for assistance with personal care: Secondary | ICD-10-CM | POA: Diagnosis not present

## 2018-06-01 DIAGNOSIS — R1312 Dysphagia, oropharyngeal phase: Secondary | ICD-10-CM | POA: Diagnosis not present

## 2018-06-01 DIAGNOSIS — R262 Difficulty in walking, not elsewhere classified: Secondary | ICD-10-CM | POA: Diagnosis not present

## 2018-06-01 DIAGNOSIS — R279 Unspecified lack of coordination: Secondary | ICD-10-CM | POA: Diagnosis not present

## 2018-06-03 DIAGNOSIS — R279 Unspecified lack of coordination: Secondary | ICD-10-CM | POA: Diagnosis not present

## 2018-06-03 DIAGNOSIS — R4701 Aphasia: Secondary | ICD-10-CM | POA: Diagnosis not present

## 2018-06-03 DIAGNOSIS — R262 Difficulty in walking, not elsewhere classified: Secondary | ICD-10-CM | POA: Diagnosis not present

## 2018-06-03 DIAGNOSIS — Z741 Need for assistance with personal care: Secondary | ICD-10-CM | POA: Diagnosis not present

## 2018-06-03 DIAGNOSIS — R1312 Dysphagia, oropharyngeal phase: Secondary | ICD-10-CM | POA: Diagnosis not present

## 2018-06-03 DIAGNOSIS — R41841 Cognitive communication deficit: Secondary | ICD-10-CM | POA: Diagnosis not present

## 2018-06-03 DIAGNOSIS — R269 Unspecified abnormalities of gait and mobility: Secondary | ICD-10-CM | POA: Diagnosis not present

## 2018-06-03 DIAGNOSIS — I5031 Acute diastolic (congestive) heart failure: Secondary | ICD-10-CM | POA: Diagnosis not present

## 2018-06-03 DIAGNOSIS — M6281 Muscle weakness (generalized): Secondary | ICD-10-CM | POA: Diagnosis not present

## 2018-06-03 DIAGNOSIS — R278 Other lack of coordination: Secondary | ICD-10-CM | POA: Diagnosis not present

## 2018-06-04 DIAGNOSIS — R1312 Dysphagia, oropharyngeal phase: Secondary | ICD-10-CM | POA: Diagnosis not present

## 2018-06-04 DIAGNOSIS — Z741 Need for assistance with personal care: Secondary | ICD-10-CM | POA: Diagnosis not present

## 2018-06-04 DIAGNOSIS — R41841 Cognitive communication deficit: Secondary | ICD-10-CM | POA: Diagnosis not present

## 2018-06-04 DIAGNOSIS — R279 Unspecified lack of coordination: Secondary | ICD-10-CM | POA: Diagnosis not present

## 2018-06-04 DIAGNOSIS — R269 Unspecified abnormalities of gait and mobility: Secondary | ICD-10-CM | POA: Diagnosis not present

## 2018-06-04 DIAGNOSIS — I5031 Acute diastolic (congestive) heart failure: Secondary | ICD-10-CM | POA: Diagnosis not present

## 2018-06-04 DIAGNOSIS — R278 Other lack of coordination: Secondary | ICD-10-CM | POA: Diagnosis not present

## 2018-06-04 DIAGNOSIS — R4701 Aphasia: Secondary | ICD-10-CM | POA: Diagnosis not present

## 2018-06-04 DIAGNOSIS — R262 Difficulty in walking, not elsewhere classified: Secondary | ICD-10-CM | POA: Diagnosis not present

## 2018-06-04 DIAGNOSIS — M6281 Muscle weakness (generalized): Secondary | ICD-10-CM | POA: Diagnosis not present

## 2018-06-06 DIAGNOSIS — R41841 Cognitive communication deficit: Secondary | ICD-10-CM | POA: Diagnosis not present

## 2018-06-06 DIAGNOSIS — D649 Anemia, unspecified: Secondary | ICD-10-CM | POA: Diagnosis not present

## 2018-06-06 DIAGNOSIS — R262 Difficulty in walking, not elsewhere classified: Secondary | ICD-10-CM | POA: Diagnosis not present

## 2018-06-06 DIAGNOSIS — Z79899 Other long term (current) drug therapy: Secondary | ICD-10-CM | POA: Diagnosis not present

## 2018-06-06 DIAGNOSIS — E039 Hypothyroidism, unspecified: Secondary | ICD-10-CM | POA: Diagnosis not present

## 2018-06-06 DIAGNOSIS — R269 Unspecified abnormalities of gait and mobility: Secondary | ICD-10-CM | POA: Diagnosis not present

## 2018-06-06 DIAGNOSIS — I5031 Acute diastolic (congestive) heart failure: Secondary | ICD-10-CM | POA: Diagnosis not present

## 2018-06-06 DIAGNOSIS — R278 Other lack of coordination: Secondary | ICD-10-CM | POA: Diagnosis not present

## 2018-06-06 DIAGNOSIS — R279 Unspecified lack of coordination: Secondary | ICD-10-CM | POA: Diagnosis not present

## 2018-06-06 DIAGNOSIS — M6281 Muscle weakness (generalized): Secondary | ICD-10-CM | POA: Diagnosis not present

## 2018-06-06 DIAGNOSIS — R4701 Aphasia: Secondary | ICD-10-CM | POA: Diagnosis not present

## 2018-06-06 DIAGNOSIS — R1312 Dysphagia, oropharyngeal phase: Secondary | ICD-10-CM | POA: Diagnosis not present

## 2018-06-06 DIAGNOSIS — Z741 Need for assistance with personal care: Secondary | ICD-10-CM | POA: Diagnosis not present

## 2018-06-08 DIAGNOSIS — N390 Urinary tract infection, site not specified: Secondary | ICD-10-CM | POA: Diagnosis not present

## 2018-06-08 DIAGNOSIS — R319 Hematuria, unspecified: Secondary | ICD-10-CM | POA: Diagnosis not present

## 2018-06-08 DIAGNOSIS — I5031 Acute diastolic (congestive) heart failure: Secondary | ICD-10-CM | POA: Diagnosis not present

## 2018-06-08 DIAGNOSIS — R269 Unspecified abnormalities of gait and mobility: Secondary | ICD-10-CM | POA: Diagnosis not present

## 2018-06-08 DIAGNOSIS — R41841 Cognitive communication deficit: Secondary | ICD-10-CM | POA: Diagnosis not present

## 2018-06-08 DIAGNOSIS — R262 Difficulty in walking, not elsewhere classified: Secondary | ICD-10-CM | POA: Diagnosis not present

## 2018-06-08 DIAGNOSIS — R4701 Aphasia: Secondary | ICD-10-CM | POA: Diagnosis not present

## 2018-06-08 DIAGNOSIS — Z79899 Other long term (current) drug therapy: Secondary | ICD-10-CM | POA: Diagnosis not present

## 2018-06-08 DIAGNOSIS — R279 Unspecified lack of coordination: Secondary | ICD-10-CM | POA: Diagnosis not present

## 2018-06-08 DIAGNOSIS — Z741 Need for assistance with personal care: Secondary | ICD-10-CM | POA: Diagnosis not present

## 2018-06-08 DIAGNOSIS — M6281 Muscle weakness (generalized): Secondary | ICD-10-CM | POA: Diagnosis not present

## 2018-06-08 DIAGNOSIS — G4751 Confusional arousals: Secondary | ICD-10-CM | POA: Diagnosis not present

## 2018-06-08 DIAGNOSIS — R278 Other lack of coordination: Secondary | ICD-10-CM | POA: Diagnosis not present

## 2018-06-08 DIAGNOSIS — R1312 Dysphagia, oropharyngeal phase: Secondary | ICD-10-CM | POA: Diagnosis not present

## 2018-06-09 DIAGNOSIS — N390 Urinary tract infection, site not specified: Secondary | ICD-10-CM | POA: Diagnosis not present

## 2018-06-09 DIAGNOSIS — I480 Paroxysmal atrial fibrillation: Secondary | ICD-10-CM | POA: Diagnosis not present

## 2018-06-09 DIAGNOSIS — I10 Essential (primary) hypertension: Secondary | ICD-10-CM | POA: Diagnosis not present

## 2018-06-09 DIAGNOSIS — E039 Hypothyroidism, unspecified: Secondary | ICD-10-CM | POA: Diagnosis not present

## 2018-06-11 DIAGNOSIS — R4701 Aphasia: Secondary | ICD-10-CM | POA: Diagnosis not present

## 2018-06-11 DIAGNOSIS — R269 Unspecified abnormalities of gait and mobility: Secondary | ICD-10-CM | POA: Diagnosis not present

## 2018-06-11 DIAGNOSIS — R41841 Cognitive communication deficit: Secondary | ICD-10-CM | POA: Diagnosis not present

## 2018-06-11 DIAGNOSIS — R262 Difficulty in walking, not elsewhere classified: Secondary | ICD-10-CM | POA: Diagnosis not present

## 2018-06-11 DIAGNOSIS — R279 Unspecified lack of coordination: Secondary | ICD-10-CM | POA: Diagnosis not present

## 2018-06-11 DIAGNOSIS — R278 Other lack of coordination: Secondary | ICD-10-CM | POA: Diagnosis not present

## 2018-06-11 DIAGNOSIS — I5031 Acute diastolic (congestive) heart failure: Secondary | ICD-10-CM | POA: Diagnosis not present

## 2018-06-11 DIAGNOSIS — R1312 Dysphagia, oropharyngeal phase: Secondary | ICD-10-CM | POA: Diagnosis not present

## 2018-06-11 DIAGNOSIS — Z741 Need for assistance with personal care: Secondary | ICD-10-CM | POA: Diagnosis not present

## 2018-06-11 DIAGNOSIS — M6281 Muscle weakness (generalized): Secondary | ICD-10-CM | POA: Diagnosis not present

## 2018-06-12 DIAGNOSIS — R41841 Cognitive communication deficit: Secondary | ICD-10-CM | POA: Diagnosis not present

## 2018-06-12 DIAGNOSIS — R279 Unspecified lack of coordination: Secondary | ICD-10-CM | POA: Diagnosis not present

## 2018-06-12 DIAGNOSIS — R4701 Aphasia: Secondary | ICD-10-CM | POA: Diagnosis not present

## 2018-06-12 DIAGNOSIS — I5031 Acute diastolic (congestive) heart failure: Secondary | ICD-10-CM | POA: Diagnosis not present

## 2018-06-12 DIAGNOSIS — R278 Other lack of coordination: Secondary | ICD-10-CM | POA: Diagnosis not present

## 2018-06-12 DIAGNOSIS — M6281 Muscle weakness (generalized): Secondary | ICD-10-CM | POA: Diagnosis not present

## 2018-06-12 DIAGNOSIS — Z741 Need for assistance with personal care: Secondary | ICD-10-CM | POA: Diagnosis not present

## 2018-06-12 DIAGNOSIS — N400 Enlarged prostate without lower urinary tract symptoms: Secondary | ICD-10-CM | POA: Diagnosis not present

## 2018-06-12 DIAGNOSIS — R269 Unspecified abnormalities of gait and mobility: Secondary | ICD-10-CM | POA: Diagnosis not present

## 2018-06-12 DIAGNOSIS — I10 Essential (primary) hypertension: Secondary | ICD-10-CM | POA: Diagnosis not present

## 2018-06-12 DIAGNOSIS — R1312 Dysphagia, oropharyngeal phase: Secondary | ICD-10-CM | POA: Diagnosis not present

## 2018-06-12 DIAGNOSIS — I480 Paroxysmal atrial fibrillation: Secondary | ICD-10-CM | POA: Diagnosis not present

## 2018-06-12 DIAGNOSIS — E039 Hypothyroidism, unspecified: Secondary | ICD-10-CM | POA: Diagnosis not present

## 2018-06-12 DIAGNOSIS — R262 Difficulty in walking, not elsewhere classified: Secondary | ICD-10-CM | POA: Diagnosis not present

## 2018-06-13 DIAGNOSIS — R279 Unspecified lack of coordination: Secondary | ICD-10-CM | POA: Diagnosis not present

## 2018-06-13 DIAGNOSIS — R1312 Dysphagia, oropharyngeal phase: Secondary | ICD-10-CM | POA: Diagnosis not present

## 2018-06-13 DIAGNOSIS — R262 Difficulty in walking, not elsewhere classified: Secondary | ICD-10-CM | POA: Diagnosis not present

## 2018-06-13 DIAGNOSIS — I5031 Acute diastolic (congestive) heart failure: Secondary | ICD-10-CM | POA: Diagnosis not present

## 2018-06-13 DIAGNOSIS — R278 Other lack of coordination: Secondary | ICD-10-CM | POA: Diagnosis not present

## 2018-06-13 DIAGNOSIS — R269 Unspecified abnormalities of gait and mobility: Secondary | ICD-10-CM | POA: Diagnosis not present

## 2018-06-13 DIAGNOSIS — R41841 Cognitive communication deficit: Secondary | ICD-10-CM | POA: Diagnosis not present

## 2018-06-13 DIAGNOSIS — Z741 Need for assistance with personal care: Secondary | ICD-10-CM | POA: Diagnosis not present

## 2018-06-13 DIAGNOSIS — R4701 Aphasia: Secondary | ICD-10-CM | POA: Diagnosis not present

## 2018-06-13 DIAGNOSIS — M6281 Muscle weakness (generalized): Secondary | ICD-10-CM | POA: Diagnosis not present

## 2018-06-14 ENCOUNTER — Inpatient Hospital Stay
Admission: AD | Admit: 2018-06-14 | Payer: Self-pay | Source: Other Acute Inpatient Hospital | Admitting: Pulmonary Disease

## 2018-06-14 DIAGNOSIS — A419 Sepsis, unspecified organism: Secondary | ICD-10-CM | POA: Diagnosis not present

## 2018-06-14 DIAGNOSIS — R0602 Shortness of breath: Secondary | ICD-10-CM | POA: Diagnosis not present

## 2018-06-14 DIAGNOSIS — R188 Other ascites: Secondary | ICD-10-CM | POA: Diagnosis not present

## 2018-06-14 DIAGNOSIS — Z66 Do not resuscitate: Secondary | ICD-10-CM | POA: Diagnosis not present

## 2018-06-14 DIAGNOSIS — N179 Acute kidney failure, unspecified: Secondary | ICD-10-CM | POA: Diagnosis not present

## 2018-06-14 DIAGNOSIS — I4891 Unspecified atrial fibrillation: Secondary | ICD-10-CM | POA: Diagnosis not present

## 2018-06-14 DIAGNOSIS — F419 Anxiety disorder, unspecified: Secondary | ICD-10-CM | POA: Diagnosis not present

## 2018-06-14 DIAGNOSIS — K7200 Acute and subacute hepatic failure without coma: Secondary | ICD-10-CM | POA: Diagnosis not present

## 2018-06-14 DIAGNOSIS — R1312 Dysphagia, oropharyngeal phase: Secondary | ICD-10-CM | POA: Diagnosis not present

## 2018-06-14 DIAGNOSIS — R6520 Severe sepsis without septic shock: Secondary | ICD-10-CM | POA: Diagnosis not present

## 2018-06-14 DIAGNOSIS — R69 Illness, unspecified: Secondary | ICD-10-CM | POA: Diagnosis not present

## 2018-06-14 DIAGNOSIS — I214 Non-ST elevation (NSTEMI) myocardial infarction: Secondary | ICD-10-CM | POA: Diagnosis not present

## 2018-06-14 DIAGNOSIS — R262 Difficulty in walking, not elsewhere classified: Secondary | ICD-10-CM | POA: Diagnosis not present

## 2018-06-14 DIAGNOSIS — R4701 Aphasia: Secondary | ICD-10-CM | POA: Diagnosis not present

## 2018-06-14 DIAGNOSIS — R269 Unspecified abnormalities of gait and mobility: Secondary | ICD-10-CM | POA: Diagnosis not present

## 2018-06-14 DIAGNOSIS — R4182 Altered mental status, unspecified: Secondary | ICD-10-CM | POA: Diagnosis not present

## 2018-06-14 DIAGNOSIS — Z79899 Other long term (current) drug therapy: Secondary | ICD-10-CM | POA: Diagnosis not present

## 2018-06-14 DIAGNOSIS — Z741 Need for assistance with personal care: Secondary | ICD-10-CM | POA: Diagnosis not present

## 2018-06-14 DIAGNOSIS — N390 Urinary tract infection, site not specified: Secondary | ICD-10-CM | POA: Diagnosis not present

## 2018-06-14 DIAGNOSIS — Z515 Encounter for palliative care: Secondary | ICD-10-CM | POA: Diagnosis not present

## 2018-06-14 DIAGNOSIS — E872 Acidosis: Secondary | ICD-10-CM | POA: Diagnosis not present

## 2018-06-14 DIAGNOSIS — I5031 Acute diastolic (congestive) heart failure: Secondary | ICD-10-CM | POA: Diagnosis not present

## 2018-06-14 DIAGNOSIS — R404 Transient alteration of awareness: Secondary | ICD-10-CM | POA: Diagnosis not present

## 2018-06-14 DIAGNOSIS — R279 Unspecified lack of coordination: Secondary | ICD-10-CM | POA: Diagnosis not present

## 2018-06-14 DIAGNOSIS — R278 Other lack of coordination: Secondary | ICD-10-CM | POA: Diagnosis not present

## 2018-06-14 DIAGNOSIS — R410 Disorientation, unspecified: Secondary | ICD-10-CM | POA: Diagnosis not present

## 2018-06-14 DIAGNOSIS — R41841 Cognitive communication deficit: Secondary | ICD-10-CM | POA: Diagnosis not present

## 2018-06-14 DIAGNOSIS — I959 Hypotension, unspecified: Secondary | ICD-10-CM | POA: Diagnosis not present

## 2018-06-14 DIAGNOSIS — M6281 Muscle weakness (generalized): Secondary | ICD-10-CM | POA: Diagnosis not present

## 2018-06-14 DIAGNOSIS — R0902 Hypoxemia: Secondary | ICD-10-CM | POA: Diagnosis not present

## 2018-06-14 DIAGNOSIS — R945 Abnormal results of liver function studies: Secondary | ICD-10-CM | POA: Diagnosis not present

## 2018-06-15 DIAGNOSIS — N179 Acute kidney failure, unspecified: Secondary | ICD-10-CM | POA: Diagnosis not present

## 2018-06-15 DIAGNOSIS — I959 Hypotension, unspecified: Secondary | ICD-10-CM | POA: Diagnosis not present

## 2018-06-15 DIAGNOSIS — Z66 Do not resuscitate: Secondary | ICD-10-CM | POA: Diagnosis not present

## 2018-06-15 DIAGNOSIS — E872 Acidosis: Secondary | ICD-10-CM | POA: Diagnosis not present

## 2018-06-15 DIAGNOSIS — Z79899 Other long term (current) drug therapy: Secondary | ICD-10-CM | POA: Diagnosis not present

## 2018-06-15 DIAGNOSIS — K7290 Hepatic failure, unspecified without coma: Secondary | ICD-10-CM | POA: Diagnosis not present

## 2018-06-15 DIAGNOSIS — Z515 Encounter for palliative care: Secondary | ICD-10-CM | POA: Diagnosis not present

## 2018-06-15 DIAGNOSIS — I214 Non-ST elevation (NSTEMI) myocardial infarction: Secondary | ICD-10-CM | POA: Diagnosis not present

## 2018-06-15 DIAGNOSIS — R69 Illness, unspecified: Secondary | ICD-10-CM | POA: Diagnosis not present

## 2018-06-15 DIAGNOSIS — A419 Sepsis, unspecified organism: Secondary | ICD-10-CM | POA: Diagnosis not present

## 2018-06-15 DIAGNOSIS — K7200 Acute and subacute hepatic failure without coma: Secondary | ICD-10-CM | POA: Diagnosis not present

## 2018-06-24 DEATH — deceased
